# Patient Record
Sex: Male | Born: 1940 | Race: White | Hispanic: No | Marital: Married | Smoking: Never smoker
Health system: Southern US, Community
[De-identification: ages and names within clinical notes are randomized; demographics above are authoritative.]

## PROBLEM LIST (undated history)

## (undated) DIAGNOSIS — C9110 Chronic lymphocytic leukemia of B-cell type not having achieved remission: Secondary | ICD-10-CM

## (undated) DIAGNOSIS — M5430 Sciatica, unspecified side: Secondary | ICD-10-CM

## (undated) HISTORY — PX: INSERT / REPLACE / REMOVE PACEMAKER: SUR710

## (undated) MED FILL — Iron Sucrose Inj 20 MG/ML (Fe Equiv): INTRAVENOUS | Qty: 10 | Status: AC

---

## 2011-06-09 DIAGNOSIS — A070 Balantidiasis: Secondary | ICD-10-CM | POA: Diagnosis not present

## 2011-06-09 DIAGNOSIS — N400 Enlarged prostate without lower urinary tract symptoms: Secondary | ICD-10-CM | POA: Diagnosis not present

## 2011-07-22 DIAGNOSIS — C9110 Chronic lymphocytic leukemia of B-cell type not having achieved remission: Secondary | ICD-10-CM | POA: Diagnosis not present

## 2011-07-22 DIAGNOSIS — Z719 Counseling, unspecified: Secondary | ICD-10-CM | POA: Diagnosis not present

## 2011-09-16 DIAGNOSIS — I83893 Varicose veins of bilateral lower extremities with other complications: Secondary | ICD-10-CM | POA: Diagnosis not present

## 2011-09-16 DIAGNOSIS — G4700 Insomnia, unspecified: Secondary | ICD-10-CM | POA: Diagnosis not present

## 2011-11-25 DIAGNOSIS — M26629 Arthralgia of temporomandibular joint, unspecified side: Secondary | ICD-10-CM | POA: Diagnosis not present

## 2011-12-30 DIAGNOSIS — C9110 Chronic lymphocytic leukemia of B-cell type not having achieved remission: Secondary | ICD-10-CM | POA: Diagnosis not present

## 2011-12-30 DIAGNOSIS — Z719 Counseling, unspecified: Secondary | ICD-10-CM | POA: Diagnosis not present

## 2012-01-12 DIAGNOSIS — J040 Acute laryngitis: Secondary | ICD-10-CM | POA: Diagnosis not present

## 2012-02-03 DIAGNOSIS — B379 Candidiasis, unspecified: Secondary | ICD-10-CM | POA: Diagnosis not present

## 2012-02-03 DIAGNOSIS — J309 Allergic rhinitis, unspecified: Secondary | ICD-10-CM | POA: Diagnosis not present

## 2012-02-03 DIAGNOSIS — K219 Gastro-esophageal reflux disease without esophagitis: Secondary | ICD-10-CM | POA: Diagnosis not present

## 2012-02-12 DIAGNOSIS — S6390XA Sprain of unspecified part of unspecified wrist and hand, initial encounter: Secondary | ICD-10-CM | POA: Diagnosis not present

## 2012-02-12 DIAGNOSIS — M79609 Pain in unspecified limb: Secondary | ICD-10-CM | POA: Diagnosis not present

## 2012-02-25 DIAGNOSIS — Z23 Encounter for immunization: Secondary | ICD-10-CM | POA: Diagnosis not present

## 2012-05-12 DIAGNOSIS — H5200 Hypermetropia, unspecified eye: Secondary | ICD-10-CM | POA: Diagnosis not present

## 2012-05-12 DIAGNOSIS — H524 Presbyopia: Secondary | ICD-10-CM | POA: Diagnosis not present

## 2012-05-12 DIAGNOSIS — H2510 Age-related nuclear cataract, unspecified eye: Secondary | ICD-10-CM | POA: Diagnosis not present

## 2012-06-28 DIAGNOSIS — C9110 Chronic lymphocytic leukemia of B-cell type not having achieved remission: Secondary | ICD-10-CM | POA: Diagnosis not present

## 2012-06-28 DIAGNOSIS — I2510 Atherosclerotic heart disease of native coronary artery without angina pectoris: Secondary | ICD-10-CM | POA: Diagnosis not present

## 2012-06-28 DIAGNOSIS — R110 Nausea: Secondary | ICD-10-CM | POA: Diagnosis not present

## 2012-06-28 DIAGNOSIS — G609 Hereditary and idiopathic neuropathy, unspecified: Secondary | ICD-10-CM | POA: Diagnosis not present

## 2012-07-24 DIAGNOSIS — S82409A Unspecified fracture of shaft of unspecified fibula, initial encounter for closed fracture: Secondary | ICD-10-CM | POA: Diagnosis not present

## 2012-07-25 DIAGNOSIS — S8263XA Displaced fracture of lateral malleolus of unspecified fibula, initial encounter for closed fracture: Secondary | ICD-10-CM | POA: Diagnosis not present

## 2012-08-08 DIAGNOSIS — S8263XA Displaced fracture of lateral malleolus of unspecified fibula, initial encounter for closed fracture: Secondary | ICD-10-CM | POA: Diagnosis not present

## 2012-08-14 DIAGNOSIS — R351 Nocturia: Secondary | ICD-10-CM | POA: Diagnosis not present

## 2012-08-14 DIAGNOSIS — N401 Enlarged prostate with lower urinary tract symptoms: Secondary | ICD-10-CM | POA: Diagnosis not present

## 2012-08-14 DIAGNOSIS — N138 Other obstructive and reflux uropathy: Secondary | ICD-10-CM | POA: Diagnosis not present

## 2012-09-04 DIAGNOSIS — N401 Enlarged prostate with lower urinary tract symptoms: Secondary | ICD-10-CM | POA: Diagnosis not present

## 2012-09-04 DIAGNOSIS — N138 Other obstructive and reflux uropathy: Secondary | ICD-10-CM | POA: Diagnosis not present

## 2012-09-04 DIAGNOSIS — R351 Nocturia: Secondary | ICD-10-CM | POA: Diagnosis not present

## 2012-09-04 DIAGNOSIS — Z125 Encounter for screening for malignant neoplasm of prostate: Secondary | ICD-10-CM | POA: Diagnosis not present

## 2012-09-05 DIAGNOSIS — S8263XA Displaced fracture of lateral malleolus of unspecified fibula, initial encounter for closed fracture: Secondary | ICD-10-CM | POA: Diagnosis not present

## 2012-10-16 DIAGNOSIS — R351 Nocturia: Secondary | ICD-10-CM | POA: Diagnosis not present

## 2012-10-16 DIAGNOSIS — N138 Other obstructive and reflux uropathy: Secondary | ICD-10-CM | POA: Diagnosis not present

## 2012-10-16 DIAGNOSIS — N401 Enlarged prostate with lower urinary tract symptoms: Secondary | ICD-10-CM | POA: Diagnosis not present

## 2012-10-17 DIAGNOSIS — S8263XA Displaced fracture of lateral malleolus of unspecified fibula, initial encounter for closed fracture: Secondary | ICD-10-CM | POA: Diagnosis not present

## 2012-11-17 DIAGNOSIS — I442 Atrioventricular block, complete: Secondary | ICD-10-CM | POA: Diagnosis not present

## 2012-11-20 DIAGNOSIS — R351 Nocturia: Secondary | ICD-10-CM | POA: Diagnosis not present

## 2012-11-20 DIAGNOSIS — I442 Atrioventricular block, complete: Secondary | ICD-10-CM | POA: Diagnosis not present

## 2012-11-20 DIAGNOSIS — N401 Enlarged prostate with lower urinary tract symptoms: Secondary | ICD-10-CM | POA: Diagnosis not present

## 2012-11-20 DIAGNOSIS — N138 Other obstructive and reflux uropathy: Secondary | ICD-10-CM | POA: Diagnosis not present

## 2012-11-21 DIAGNOSIS — J029 Acute pharyngitis, unspecified: Secondary | ICD-10-CM | POA: Diagnosis not present

## 2012-11-21 DIAGNOSIS — J309 Allergic rhinitis, unspecified: Secondary | ICD-10-CM | POA: Diagnosis not present

## 2012-12-14 DIAGNOSIS — S93409A Sprain of unspecified ligament of unspecified ankle, initial encounter: Secondary | ICD-10-CM | POA: Diagnosis not present

## 2012-12-19 DIAGNOSIS — R351 Nocturia: Secondary | ICD-10-CM | POA: Diagnosis not present

## 2012-12-19 DIAGNOSIS — N529 Male erectile dysfunction, unspecified: Secondary | ICD-10-CM | POA: Diagnosis not present

## 2012-12-19 DIAGNOSIS — N401 Enlarged prostate with lower urinary tract symptoms: Secondary | ICD-10-CM | POA: Diagnosis not present

## 2012-12-19 DIAGNOSIS — N138 Other obstructive and reflux uropathy: Secondary | ICD-10-CM | POA: Diagnosis not present

## 2012-12-19 DIAGNOSIS — R21 Rash and other nonspecific skin eruption: Secondary | ICD-10-CM | POA: Diagnosis not present

## 2013-02-15 DIAGNOSIS — Z0000 Encounter for general adult medical examination without abnormal findings: Secondary | ICD-10-CM | POA: Diagnosis not present

## 2013-02-15 DIAGNOSIS — Z23 Encounter for immunization: Secondary | ICD-10-CM | POA: Diagnosis not present

## 2013-02-15 DIAGNOSIS — R946 Abnormal results of thyroid function studies: Secondary | ICD-10-CM | POA: Diagnosis not present

## 2013-02-15 DIAGNOSIS — C9110 Chronic lymphocytic leukemia of B-cell type not having achieved remission: Secondary | ICD-10-CM | POA: Diagnosis not present

## 2013-02-15 DIAGNOSIS — Z13220 Encounter for screening for lipoid disorders: Secondary | ICD-10-CM | POA: Diagnosis not present

## 2013-02-15 DIAGNOSIS — K219 Gastro-esophageal reflux disease without esophagitis: Secondary | ICD-10-CM | POA: Diagnosis not present

## 2013-03-02 DIAGNOSIS — Z23 Encounter for immunization: Secondary | ICD-10-CM | POA: Diagnosis not present

## 2013-03-21 DIAGNOSIS — R351 Nocturia: Secondary | ICD-10-CM | POA: Diagnosis not present

## 2013-03-21 DIAGNOSIS — N529 Male erectile dysfunction, unspecified: Secondary | ICD-10-CM | POA: Diagnosis not present

## 2013-03-21 DIAGNOSIS — N138 Other obstructive and reflux uropathy: Secondary | ICD-10-CM | POA: Diagnosis not present

## 2013-03-21 DIAGNOSIS — N401 Enlarged prostate with lower urinary tract symptoms: Secondary | ICD-10-CM | POA: Diagnosis not present

## 2013-03-26 DIAGNOSIS — J309 Allergic rhinitis, unspecified: Secondary | ICD-10-CM | POA: Diagnosis not present

## 2013-03-26 DIAGNOSIS — C9110 Chronic lymphocytic leukemia of B-cell type not having achieved remission: Secondary | ICD-10-CM | POA: Diagnosis not present

## 2013-04-05 DIAGNOSIS — I495 Sick sinus syndrome: Secondary | ICD-10-CM | POA: Diagnosis not present

## 2013-04-11 DIAGNOSIS — Z950 Presence of cardiac pacemaker: Secondary | ICD-10-CM | POA: Diagnosis not present

## 2013-04-11 DIAGNOSIS — I495 Sick sinus syndrome: Secondary | ICD-10-CM | POA: Diagnosis not present

## 2013-04-18 DIAGNOSIS — N529 Male erectile dysfunction, unspecified: Secondary | ICD-10-CM | POA: Diagnosis not present

## 2013-04-18 DIAGNOSIS — N401 Enlarged prostate with lower urinary tract symptoms: Secondary | ICD-10-CM | POA: Diagnosis not present

## 2013-04-18 DIAGNOSIS — R351 Nocturia: Secondary | ICD-10-CM | POA: Diagnosis not present

## 2013-04-18 DIAGNOSIS — N138 Other obstructive and reflux uropathy: Secondary | ICD-10-CM | POA: Diagnosis not present

## 2013-04-27 DIAGNOSIS — I459 Conduction disorder, unspecified: Secondary | ICD-10-CM | POA: Diagnosis not present

## 2013-05-01 DIAGNOSIS — I459 Conduction disorder, unspecified: Secondary | ICD-10-CM | POA: Diagnosis not present

## 2013-06-04 DIAGNOSIS — G4700 Insomnia, unspecified: Secondary | ICD-10-CM | POA: Diagnosis not present

## 2013-06-04 DIAGNOSIS — J111 Influenza due to unidentified influenza virus with other respiratory manifestations: Secondary | ICD-10-CM | POA: Diagnosis not present

## 2013-06-18 DIAGNOSIS — N138 Other obstructive and reflux uropathy: Secondary | ICD-10-CM | POA: Diagnosis not present

## 2013-06-18 DIAGNOSIS — R350 Frequency of micturition: Secondary | ICD-10-CM | POA: Diagnosis not present

## 2013-06-18 DIAGNOSIS — N401 Enlarged prostate with lower urinary tract symptoms: Secondary | ICD-10-CM | POA: Diagnosis not present

## 2013-06-18 DIAGNOSIS — R351 Nocturia: Secondary | ICD-10-CM | POA: Diagnosis not present

## 2013-08-15 DIAGNOSIS — I459 Conduction disorder, unspecified: Secondary | ICD-10-CM | POA: Diagnosis not present

## 2013-08-15 DIAGNOSIS — R079 Chest pain, unspecified: Secondary | ICD-10-CM | POA: Diagnosis not present

## 2013-08-23 DIAGNOSIS — E7800 Pure hypercholesterolemia, unspecified: Secondary | ICD-10-CM | POA: Diagnosis not present

## 2013-08-23 DIAGNOSIS — R079 Chest pain, unspecified: Secondary | ICD-10-CM | POA: Diagnosis not present

## 2013-08-23 DIAGNOSIS — I459 Conduction disorder, unspecified: Secondary | ICD-10-CM | POA: Diagnosis not present

## 2013-08-23 DIAGNOSIS — Z8249 Family history of ischemic heart disease and other diseases of the circulatory system: Secondary | ICD-10-CM | POA: Diagnosis not present

## 2013-08-23 DIAGNOSIS — Z950 Presence of cardiac pacemaker: Secondary | ICD-10-CM | POA: Diagnosis not present

## 2013-08-23 DIAGNOSIS — C9110 Chronic lymphocytic leukemia of B-cell type not having achieved remission: Secondary | ICD-10-CM | POA: Diagnosis not present

## 2013-08-23 DIAGNOSIS — I10 Essential (primary) hypertension: Secondary | ICD-10-CM | POA: Diagnosis not present

## 2013-09-06 DIAGNOSIS — R0789 Other chest pain: Secondary | ICD-10-CM | POA: Diagnosis not present

## 2013-09-06 DIAGNOSIS — I10 Essential (primary) hypertension: Secondary | ICD-10-CM | POA: Diagnosis not present

## 2013-10-15 DIAGNOSIS — R0789 Other chest pain: Secondary | ICD-10-CM | POA: Diagnosis not present

## 2013-10-19 DIAGNOSIS — I495 Sick sinus syndrome: Secondary | ICD-10-CM | POA: Diagnosis not present

## 2013-10-23 DIAGNOSIS — I495 Sick sinus syndrome: Secondary | ICD-10-CM | POA: Diagnosis not present

## 2013-11-21 DIAGNOSIS — N401 Enlarged prostate with lower urinary tract symptoms: Secondary | ICD-10-CM | POA: Diagnosis not present

## 2013-11-21 DIAGNOSIS — N138 Other obstructive and reflux uropathy: Secondary | ICD-10-CM | POA: Diagnosis not present

## 2013-11-21 DIAGNOSIS — Z125 Encounter for screening for malignant neoplasm of prostate: Secondary | ICD-10-CM | POA: Diagnosis not present

## 2013-12-06 DIAGNOSIS — H1030 Unspecified acute conjunctivitis, unspecified eye: Secondary | ICD-10-CM | POA: Diagnosis not present

## 2013-12-12 DIAGNOSIS — H11449 Conjunctival cysts, unspecified eye: Secondary | ICD-10-CM | POA: Diagnosis not present

## 2014-01-03 DIAGNOSIS — D2360 Other benign neoplasm of skin of unspecified upper limb, including shoulder: Secondary | ICD-10-CM | POA: Diagnosis not present

## 2014-01-03 DIAGNOSIS — IMO0002 Reserved for concepts with insufficient information to code with codable children: Secondary | ICD-10-CM | POA: Diagnosis not present

## 2014-01-03 DIAGNOSIS — L259 Unspecified contact dermatitis, unspecified cause: Secondary | ICD-10-CM | POA: Diagnosis not present

## 2014-01-03 DIAGNOSIS — L570 Actinic keratosis: Secondary | ICD-10-CM | POA: Diagnosis not present

## 2014-01-03 DIAGNOSIS — C44611 Basal cell carcinoma of skin of unspecified upper limb, including shoulder: Secondary | ICD-10-CM | POA: Diagnosis not present

## 2014-01-03 DIAGNOSIS — L578 Other skin changes due to chronic exposure to nonionizing radiation: Secondary | ICD-10-CM | POA: Diagnosis not present

## 2014-01-03 DIAGNOSIS — L821 Other seborrheic keratosis: Secondary | ICD-10-CM | POA: Diagnosis not present

## 2014-01-21 DIAGNOSIS — R946 Abnormal results of thyroid function studies: Secondary | ICD-10-CM | POA: Diagnosis not present

## 2014-01-21 DIAGNOSIS — C9110 Chronic lymphocytic leukemia of B-cell type not having achieved remission: Secondary | ICD-10-CM | POA: Diagnosis not present

## 2014-01-21 DIAGNOSIS — E7800 Pure hypercholesterolemia, unspecified: Secondary | ICD-10-CM | POA: Diagnosis not present

## 2014-01-21 DIAGNOSIS — Z13220 Encounter for screening for lipoid disorders: Secondary | ICD-10-CM | POA: Diagnosis not present

## 2014-01-21 DIAGNOSIS — K219 Gastro-esophageal reflux disease without esophagitis: Secondary | ICD-10-CM | POA: Diagnosis not present

## 2014-01-21 DIAGNOSIS — G4700 Insomnia, unspecified: Secondary | ICD-10-CM | POA: Diagnosis not present

## 2014-01-21 DIAGNOSIS — Z79899 Other long term (current) drug therapy: Secondary | ICD-10-CM | POA: Diagnosis not present

## 2014-03-08 DIAGNOSIS — Z23 Encounter for immunization: Secondary | ICD-10-CM | POA: Diagnosis not present

## 2014-05-08 DIAGNOSIS — C4442 Squamous cell carcinoma of skin of scalp and neck: Secondary | ICD-10-CM | POA: Diagnosis not present

## 2014-05-08 DIAGNOSIS — L570 Actinic keratosis: Secondary | ICD-10-CM | POA: Diagnosis not present

## 2014-05-08 DIAGNOSIS — C44311 Basal cell carcinoma of skin of nose: Secondary | ICD-10-CM | POA: Diagnosis not present

## 2014-05-16 DIAGNOSIS — C4441 Basal cell carcinoma of skin of scalp and neck: Secondary | ICD-10-CM | POA: Diagnosis not present

## 2014-05-23 DIAGNOSIS — N529 Male erectile dysfunction, unspecified: Secondary | ICD-10-CM | POA: Diagnosis not present

## 2014-05-23 DIAGNOSIS — N401 Enlarged prostate with lower urinary tract symptoms: Secondary | ICD-10-CM | POA: Diagnosis not present

## 2014-06-19 DIAGNOSIS — C4441 Basal cell carcinoma of skin of scalp and neck: Secondary | ICD-10-CM | POA: Diagnosis not present

## 2014-06-19 DIAGNOSIS — L570 Actinic keratosis: Secondary | ICD-10-CM | POA: Diagnosis not present

## 2014-07-24 DIAGNOSIS — N401 Enlarged prostate with lower urinary tract symptoms: Secondary | ICD-10-CM | POA: Diagnosis not present

## 2014-07-24 DIAGNOSIS — N529 Male erectile dysfunction, unspecified: Secondary | ICD-10-CM | POA: Diagnosis not present

## 2014-07-24 DIAGNOSIS — R351 Nocturia: Secondary | ICD-10-CM | POA: Diagnosis not present

## 2014-10-22 DIAGNOSIS — R21 Rash and other nonspecific skin eruption: Secondary | ICD-10-CM | POA: Diagnosis not present

## 2014-10-22 DIAGNOSIS — N529 Male erectile dysfunction, unspecified: Secondary | ICD-10-CM | POA: Diagnosis not present

## 2014-10-22 DIAGNOSIS — N401 Enlarged prostate with lower urinary tract symptoms: Secondary | ICD-10-CM | POA: Diagnosis not present

## 2014-10-22 DIAGNOSIS — R351 Nocturia: Secondary | ICD-10-CM | POA: Diagnosis not present

## 2014-10-25 DIAGNOSIS — G629 Polyneuropathy, unspecified: Secondary | ICD-10-CM | POA: Diagnosis not present

## 2014-10-25 DIAGNOSIS — R51 Headache: Secondary | ICD-10-CM | POA: Diagnosis not present

## 2014-10-25 DIAGNOSIS — M25532 Pain in left wrist: Secondary | ICD-10-CM | POA: Diagnosis not present

## 2014-10-25 DIAGNOSIS — I10 Essential (primary) hypertension: Secondary | ICD-10-CM | POA: Diagnosis not present

## 2014-10-25 DIAGNOSIS — M6289 Other specified disorders of muscle: Secondary | ICD-10-CM | POA: Diagnosis not present

## 2014-11-27 DIAGNOSIS — C91Z0 Other lymphoid leukemia not having achieved remission: Secondary | ICD-10-CM | POA: Diagnosis not present

## 2014-11-27 DIAGNOSIS — J309 Allergic rhinitis, unspecified: Secondary | ICD-10-CM | POA: Diagnosis not present

## 2015-01-17 DIAGNOSIS — H2513 Age-related nuclear cataract, bilateral: Secondary | ICD-10-CM | POA: Diagnosis not present

## 2015-01-17 DIAGNOSIS — H524 Presbyopia: Secondary | ICD-10-CM | POA: Diagnosis not present

## 2015-02-28 DIAGNOSIS — Z950 Presence of cardiac pacemaker: Secondary | ICD-10-CM | POA: Diagnosis not present

## 2015-02-28 DIAGNOSIS — R079 Chest pain, unspecified: Secondary | ICD-10-CM | POA: Diagnosis not present

## 2015-03-21 DIAGNOSIS — Z23 Encounter for immunization: Secondary | ICD-10-CM | POA: Diagnosis not present

## 2015-03-21 DIAGNOSIS — C91Z0 Other lymphoid leukemia not having achieved remission: Secondary | ICD-10-CM | POA: Diagnosis not present

## 2015-03-27 DIAGNOSIS — I83813 Varicose veins of bilateral lower extremities with pain: Secondary | ICD-10-CM | POA: Diagnosis not present

## 2015-05-09 DIAGNOSIS — I442 Atrioventricular block, complete: Secondary | ICD-10-CM | POA: Diagnosis not present

## 2015-05-19 DIAGNOSIS — I442 Atrioventricular block, complete: Secondary | ICD-10-CM | POA: Diagnosis not present

## 2015-06-23 DIAGNOSIS — L570 Actinic keratosis: Secondary | ICD-10-CM | POA: Diagnosis not present

## 2015-06-23 DIAGNOSIS — C4441 Basal cell carcinoma of skin of scalp and neck: Secondary | ICD-10-CM | POA: Diagnosis not present

## 2015-06-23 DIAGNOSIS — B353 Tinea pedis: Secondary | ICD-10-CM | POA: Diagnosis not present

## 2015-06-23 DIAGNOSIS — L578 Other skin changes due to chronic exposure to nonionizing radiation: Secondary | ICD-10-CM | POA: Diagnosis not present

## 2015-06-23 DIAGNOSIS — L821 Other seborrheic keratosis: Secondary | ICD-10-CM | POA: Diagnosis not present

## 2015-09-22 DIAGNOSIS — Z13220 Encounter for screening for lipoid disorders: Secondary | ICD-10-CM | POA: Diagnosis not present

## 2015-09-22 DIAGNOSIS — C91Z0 Other lymphoid leukemia not having achieved remission: Secondary | ICD-10-CM | POA: Diagnosis not present

## 2015-09-22 DIAGNOSIS — K219 Gastro-esophageal reflux disease without esophagitis: Secondary | ICD-10-CM | POA: Diagnosis not present

## 2015-09-22 DIAGNOSIS — Z23 Encounter for immunization: Secondary | ICD-10-CM | POA: Diagnosis not present

## 2015-09-22 DIAGNOSIS — J309 Allergic rhinitis, unspecified: Secondary | ICD-10-CM | POA: Diagnosis not present

## 2015-09-22 DIAGNOSIS — Z0000 Encounter for general adult medical examination without abnormal findings: Secondary | ICD-10-CM | POA: Diagnosis not present

## 2015-11-14 DIAGNOSIS — A070 Balantidiasis: Secondary | ICD-10-CM | POA: Diagnosis not present

## 2015-11-14 DIAGNOSIS — S0341XA Sprain of jaw, right side, initial encounter: Secondary | ICD-10-CM | POA: Diagnosis not present

## 2015-11-14 DIAGNOSIS — I442 Atrioventricular block, complete: Secondary | ICD-10-CM | POA: Diagnosis not present

## 2015-11-17 DIAGNOSIS — I459 Conduction disorder, unspecified: Secondary | ICD-10-CM | POA: Diagnosis not present

## 2015-11-17 DIAGNOSIS — I442 Atrioventricular block, complete: Secondary | ICD-10-CM | POA: Diagnosis not present

## 2015-11-17 DIAGNOSIS — Z950 Presence of cardiac pacemaker: Secondary | ICD-10-CM | POA: Diagnosis not present

## 2015-11-19 DIAGNOSIS — I442 Atrioventricular block, complete: Secondary | ICD-10-CM | POA: Diagnosis not present

## 2015-12-18 DIAGNOSIS — I4430 Unspecified atrioventricular block: Secondary | ICD-10-CM | POA: Diagnosis not present

## 2015-12-18 DIAGNOSIS — I2510 Atherosclerotic heart disease of native coronary artery without angina pectoris: Secondary | ICD-10-CM | POA: Diagnosis not present

## 2016-01-13 DIAGNOSIS — M5440 Lumbago with sciatica, unspecified side: Secondary | ICD-10-CM | POA: Diagnosis not present

## 2016-03-02 DIAGNOSIS — Z1389 Encounter for screening for other disorder: Secondary | ICD-10-CM | POA: Diagnosis not present

## 2016-03-02 DIAGNOSIS — C91Z0 Other lymphoid leukemia not having achieved remission: Secondary | ICD-10-CM | POA: Diagnosis not present

## 2016-03-02 DIAGNOSIS — M5440 Lumbago with sciatica, unspecified side: Secondary | ICD-10-CM | POA: Diagnosis not present

## 2016-03-02 DIAGNOSIS — Z9181 History of falling: Secondary | ICD-10-CM | POA: Diagnosis not present

## 2016-03-12 DIAGNOSIS — Z23 Encounter for immunization: Secondary | ICD-10-CM | POA: Diagnosis not present

## 2016-04-02 DIAGNOSIS — M47816 Spondylosis without myelopathy or radiculopathy, lumbar region: Secondary | ICD-10-CM | POA: Diagnosis not present

## 2016-04-02 DIAGNOSIS — M5440 Lumbago with sciatica, unspecified side: Secondary | ICD-10-CM | POA: Diagnosis not present

## 2016-04-02 DIAGNOSIS — M5186 Other intervertebral disc disorders, lumbar region: Secondary | ICD-10-CM | POA: Diagnosis not present

## 2016-04-19 DIAGNOSIS — M5432 Sciatica, left side: Secondary | ICD-10-CM | POA: Diagnosis not present

## 2016-04-19 DIAGNOSIS — M5412 Radiculopathy, cervical region: Secondary | ICD-10-CM | POA: Diagnosis not present

## 2016-05-20 DIAGNOSIS — M5432 Sciatica, left side: Secondary | ICD-10-CM | POA: Diagnosis not present

## 2016-05-20 DIAGNOSIS — M5416 Radiculopathy, lumbar region: Secondary | ICD-10-CM | POA: Diagnosis not present

## 2016-05-20 DIAGNOSIS — M4726 Other spondylosis with radiculopathy, lumbar region: Secondary | ICD-10-CM | POA: Diagnosis not present

## 2016-05-20 DIAGNOSIS — M5412 Radiculopathy, cervical region: Secondary | ICD-10-CM | POA: Diagnosis not present

## 2016-06-01 DIAGNOSIS — M48061 Spinal stenosis, lumbar region without neurogenic claudication: Secondary | ICD-10-CM | POA: Diagnosis not present

## 2016-06-01 DIAGNOSIS — M5416 Radiculopathy, lumbar region: Secondary | ICD-10-CM | POA: Diagnosis not present

## 2016-06-01 DIAGNOSIS — M5412 Radiculopathy, cervical region: Secondary | ICD-10-CM | POA: Diagnosis not present

## 2016-06-01 DIAGNOSIS — M47812 Spondylosis without myelopathy or radiculopathy, cervical region: Secondary | ICD-10-CM | POA: Diagnosis not present

## 2016-06-09 DIAGNOSIS — M5432 Sciatica, left side: Secondary | ICD-10-CM | POA: Diagnosis not present

## 2016-06-30 DIAGNOSIS — M5432 Sciatica, left side: Secondary | ICD-10-CM | POA: Diagnosis not present

## 2016-07-14 DIAGNOSIS — M5432 Sciatica, left side: Secondary | ICD-10-CM | POA: Diagnosis not present

## 2016-07-14 DIAGNOSIS — M5000 Cervical disc disorder with myelopathy, unspecified cervical region: Secondary | ICD-10-CM | POA: Diagnosis not present

## 2016-07-14 DIAGNOSIS — M4726 Other spondylosis with radiculopathy, lumbar region: Secondary | ICD-10-CM | POA: Diagnosis not present

## 2016-07-22 DIAGNOSIS — I2699 Other pulmonary embolism without acute cor pulmonale: Secondary | ICD-10-CM | POA: Diagnosis not present

## 2016-07-22 DIAGNOSIS — J9601 Acute respiratory failure with hypoxia: Secondary | ICD-10-CM | POA: Diagnosis not present

## 2016-07-22 DIAGNOSIS — Z950 Presence of cardiac pacemaker: Secondary | ICD-10-CM | POA: Diagnosis not present

## 2016-07-22 DIAGNOSIS — R0602 Shortness of breath: Secondary | ICD-10-CM | POA: Diagnosis not present

## 2016-07-22 DIAGNOSIS — C9110 Chronic lymphocytic leukemia of B-cell type not having achieved remission: Secondary | ICD-10-CM | POA: Diagnosis present

## 2016-07-22 DIAGNOSIS — Z7982 Long term (current) use of aspirin: Secondary | ICD-10-CM | POA: Diagnosis not present

## 2016-07-22 DIAGNOSIS — Z86711 Personal history of pulmonary embolism: Secondary | ICD-10-CM | POA: Diagnosis not present

## 2016-07-22 DIAGNOSIS — Z91041 Radiographic dye allergy status: Secondary | ICD-10-CM | POA: Diagnosis not present

## 2016-07-22 DIAGNOSIS — C9190 Lymphoid leukemia, unspecified not having achieved remission: Secondary | ICD-10-CM | POA: Diagnosis not present

## 2016-07-22 DIAGNOSIS — Z881 Allergy status to other antibiotic agents status: Secondary | ICD-10-CM | POA: Diagnosis not present

## 2016-07-22 DIAGNOSIS — Z79899 Other long term (current) drug therapy: Secondary | ICD-10-CM | POA: Diagnosis not present

## 2016-07-23 DIAGNOSIS — I2699 Other pulmonary embolism without acute cor pulmonale: Secondary | ICD-10-CM

## 2016-07-23 DIAGNOSIS — C9190 Lymphoid leukemia, unspecified not having achieved remission: Secondary | ICD-10-CM

## 2016-07-23 DIAGNOSIS — J9601 Acute respiratory failure with hypoxia: Secondary | ICD-10-CM

## 2016-07-27 DIAGNOSIS — Z1389 Encounter for screening for other disorder: Secondary | ICD-10-CM | POA: Diagnosis not present

## 2016-07-27 DIAGNOSIS — M5440 Lumbago with sciatica, unspecified side: Secondary | ICD-10-CM | POA: Diagnosis not present

## 2016-07-27 DIAGNOSIS — Z86711 Personal history of pulmonary embolism: Secondary | ICD-10-CM | POA: Diagnosis not present

## 2016-07-27 DIAGNOSIS — Z9181 History of falling: Secondary | ICD-10-CM | POA: Diagnosis not present

## 2016-08-07 DIAGNOSIS — M5441 Lumbago with sciatica, right side: Secondary | ICD-10-CM | POA: Diagnosis not present

## 2016-08-07 DIAGNOSIS — M7989 Other specified soft tissue disorders: Secondary | ICD-10-CM | POA: Diagnosis not present

## 2016-08-07 DIAGNOSIS — R0602 Shortness of breath: Secondary | ICD-10-CM | POA: Diagnosis not present

## 2016-08-07 DIAGNOSIS — Z950 Presence of cardiac pacemaker: Secondary | ICD-10-CM | POA: Diagnosis not present

## 2016-08-07 DIAGNOSIS — Z86711 Personal history of pulmonary embolism: Secondary | ICD-10-CM | POA: Diagnosis not present

## 2016-08-07 DIAGNOSIS — I209 Angina pectoris, unspecified: Secondary | ICD-10-CM | POA: Diagnosis not present

## 2016-08-07 DIAGNOSIS — Z79899 Other long term (current) drug therapy: Secondary | ICD-10-CM | POA: Diagnosis not present

## 2016-08-07 DIAGNOSIS — K219 Gastro-esophageal reflux disease without esophagitis: Secondary | ICD-10-CM | POA: Diagnosis not present

## 2016-08-07 DIAGNOSIS — R591 Generalized enlarged lymph nodes: Secondary | ICD-10-CM | POA: Diagnosis not present

## 2016-08-07 DIAGNOSIS — R0609 Other forms of dyspnea: Secondary | ICD-10-CM | POA: Diagnosis not present

## 2016-08-07 DIAGNOSIS — C9190 Lymphoid leukemia, unspecified not having achieved remission: Secondary | ICD-10-CM | POA: Diagnosis not present

## 2016-08-07 DIAGNOSIS — J9601 Acute respiratory failure with hypoxia: Secondary | ICD-10-CM | POA: Diagnosis not present

## 2016-08-07 DIAGNOSIS — E669 Obesity, unspecified: Secondary | ICD-10-CM | POA: Diagnosis not present

## 2016-08-07 DIAGNOSIS — Z7982 Long term (current) use of aspirin: Secondary | ICD-10-CM | POA: Diagnosis not present

## 2016-08-07 DIAGNOSIS — D696 Thrombocytopenia, unspecified: Secondary | ICD-10-CM | POA: Diagnosis not present

## 2016-08-07 DIAGNOSIS — M549 Dorsalgia, unspecified: Secondary | ICD-10-CM | POA: Diagnosis not present

## 2016-08-07 DIAGNOSIS — G8929 Other chronic pain: Secondary | ICD-10-CM | POA: Diagnosis not present

## 2016-08-07 DIAGNOSIS — Z6832 Body mass index (BMI) 32.0-32.9, adult: Secondary | ICD-10-CM | POA: Diagnosis not present

## 2016-08-08 DIAGNOSIS — M549 Dorsalgia, unspecified: Secondary | ICD-10-CM | POA: Diagnosis not present

## 2016-08-08 DIAGNOSIS — D696 Thrombocytopenia, unspecified: Secondary | ICD-10-CM | POA: Diagnosis not present

## 2016-08-08 DIAGNOSIS — J9601 Acute respiratory failure with hypoxia: Secondary | ICD-10-CM | POA: Diagnosis not present

## 2016-08-08 DIAGNOSIS — C9110 Chronic lymphocytic leukemia of B-cell type not having achieved remission: Secondary | ICD-10-CM | POA: Diagnosis not present

## 2016-08-08 DIAGNOSIS — Z950 Presence of cardiac pacemaker: Secondary | ICD-10-CM | POA: Diagnosis not present

## 2016-08-08 DIAGNOSIS — R0609 Other forms of dyspnea: Secondary | ICD-10-CM | POA: Diagnosis not present

## 2016-08-08 DIAGNOSIS — E669 Obesity, unspecified: Secondary | ICD-10-CM | POA: Diagnosis not present

## 2016-08-08 DIAGNOSIS — R591 Generalized enlarged lymph nodes: Secondary | ICD-10-CM | POA: Diagnosis not present

## 2016-08-08 DIAGNOSIS — K219 Gastro-esophageal reflux disease without esophagitis: Secondary | ICD-10-CM | POA: Diagnosis not present

## 2016-08-08 DIAGNOSIS — M7989 Other specified soft tissue disorders: Secondary | ICD-10-CM | POA: Diagnosis not present

## 2016-08-08 DIAGNOSIS — C9190 Lymphoid leukemia, unspecified not having achieved remission: Secondary | ICD-10-CM | POA: Diagnosis not present

## 2016-08-09 DIAGNOSIS — D696 Thrombocytopenia, unspecified: Secondary | ICD-10-CM | POA: Diagnosis not present

## 2016-08-09 DIAGNOSIS — E669 Obesity, unspecified: Secondary | ICD-10-CM | POA: Diagnosis not present

## 2016-08-09 DIAGNOSIS — Z950 Presence of cardiac pacemaker: Secondary | ICD-10-CM | POA: Diagnosis not present

## 2016-08-09 DIAGNOSIS — K219 Gastro-esophageal reflux disease without esophagitis: Secondary | ICD-10-CM | POA: Diagnosis not present

## 2016-08-09 DIAGNOSIS — R0602 Shortness of breath: Secondary | ICD-10-CM | POA: Diagnosis not present

## 2016-08-09 DIAGNOSIS — R591 Generalized enlarged lymph nodes: Secondary | ICD-10-CM | POA: Diagnosis not present

## 2016-08-09 DIAGNOSIS — M549 Dorsalgia, unspecified: Secondary | ICD-10-CM | POA: Diagnosis not present

## 2016-08-09 DIAGNOSIS — R0609 Other forms of dyspnea: Secondary | ICD-10-CM | POA: Diagnosis not present

## 2016-08-09 DIAGNOSIS — M7989 Other specified soft tissue disorders: Secondary | ICD-10-CM | POA: Diagnosis not present

## 2016-08-09 DIAGNOSIS — R600 Localized edema: Secondary | ICD-10-CM | POA: Diagnosis not present

## 2016-08-09 DIAGNOSIS — J9601 Acute respiratory failure with hypoxia: Secondary | ICD-10-CM | POA: Diagnosis not present

## 2016-08-09 DIAGNOSIS — C9190 Lymphoid leukemia, unspecified not having achieved remission: Secondary | ICD-10-CM | POA: Diagnosis not present

## 2016-08-12 DIAGNOSIS — I442 Atrioventricular block, complete: Secondary | ICD-10-CM | POA: Insufficient documentation

## 2016-08-12 DIAGNOSIS — Z45018 Encounter for adjustment and management of other part of cardiac pacemaker: Secondary | ICD-10-CM | POA: Insufficient documentation

## 2016-08-12 HISTORY — DX: Atrioventricular block, complete: I44.2

## 2016-08-19 DIAGNOSIS — M5440 Lumbago with sciatica, unspecified side: Secondary | ICD-10-CM | POA: Diagnosis not present

## 2016-08-19 DIAGNOSIS — Z1389 Encounter for screening for other disorder: Secondary | ICD-10-CM | POA: Diagnosis not present

## 2016-08-19 DIAGNOSIS — C91Z0 Other lymphoid leukemia not having achieved remission: Secondary | ICD-10-CM | POA: Diagnosis not present

## 2016-08-19 DIAGNOSIS — Z9181 History of falling: Secondary | ICD-10-CM | POA: Diagnosis not present

## 2016-08-31 DIAGNOSIS — C91Z0 Other lymphoid leukemia not having achieved remission: Secondary | ICD-10-CM | POA: Diagnosis not present

## 2016-08-31 DIAGNOSIS — K1230 Oral mucositis (ulcerative), unspecified: Secondary | ICD-10-CM | POA: Diagnosis not present

## 2016-08-31 DIAGNOSIS — M5432 Sciatica, left side: Secondary | ICD-10-CM | POA: Diagnosis not present

## 2016-08-31 DIAGNOSIS — K121 Other forms of stomatitis: Secondary | ICD-10-CM | POA: Diagnosis not present

## 2016-09-02 DIAGNOSIS — R0602 Shortness of breath: Secondary | ICD-10-CM

## 2016-09-02 DIAGNOSIS — Z45018 Encounter for adjustment and management of other part of cardiac pacemaker: Secondary | ICD-10-CM | POA: Diagnosis not present

## 2016-09-02 DIAGNOSIS — C9110 Chronic lymphocytic leukemia of B-cell type not having achieved remission: Secondary | ICD-10-CM | POA: Insufficient documentation

## 2016-09-02 DIAGNOSIS — I442 Atrioventricular block, complete: Secondary | ICD-10-CM | POA: Diagnosis not present

## 2016-09-02 HISTORY — DX: Shortness of breath: R06.02

## 2016-09-06 DIAGNOSIS — C9110 Chronic lymphocytic leukemia of B-cell type not having achieved remission: Secondary | ICD-10-CM | POA: Diagnosis not present

## 2016-09-06 DIAGNOSIS — I442 Atrioventricular block, complete: Secondary | ICD-10-CM | POA: Diagnosis not present

## 2016-09-06 DIAGNOSIS — R0602 Shortness of breath: Secondary | ICD-10-CM | POA: Diagnosis not present

## 2016-09-06 DIAGNOSIS — Z45018 Encounter for adjustment and management of other part of cardiac pacemaker: Secondary | ICD-10-CM | POA: Diagnosis not present

## 2016-09-14 DIAGNOSIS — C9110 Chronic lymphocytic leukemia of B-cell type not having achieved remission: Secondary | ICD-10-CM | POA: Diagnosis not present

## 2016-09-24 DIAGNOSIS — M4696 Unspecified inflammatory spondylopathy, lumbar region: Secondary | ICD-10-CM | POA: Diagnosis not present

## 2016-09-24 DIAGNOSIS — C9510 Chronic leukemia of unspecified cell type not having achieved remission: Secondary | ICD-10-CM | POA: Diagnosis not present

## 2016-09-24 DIAGNOSIS — G894 Chronic pain syndrome: Secondary | ICD-10-CM | POA: Diagnosis not present

## 2016-09-24 DIAGNOSIS — M5136 Other intervertebral disc degeneration, lumbar region: Secondary | ICD-10-CM | POA: Diagnosis not present

## 2016-09-24 DIAGNOSIS — Q761 Klippel-Feil syndrome: Secondary | ICD-10-CM | POA: Diagnosis not present

## 2016-10-06 DIAGNOSIS — C9190 Lymphoid leukemia, unspecified not having achieved remission: Secondary | ICD-10-CM | POA: Diagnosis not present

## 2016-10-06 DIAGNOSIS — R0602 Shortness of breath: Secondary | ICD-10-CM | POA: Diagnosis not present

## 2016-10-06 DIAGNOSIS — Z45018 Encounter for adjustment and management of other part of cardiac pacemaker: Secondary | ICD-10-CM | POA: Diagnosis not present

## 2016-10-06 DIAGNOSIS — I442 Atrioventricular block, complete: Secondary | ICD-10-CM | POA: Diagnosis not present

## 2016-10-12 DIAGNOSIS — I442 Atrioventricular block, complete: Secondary | ICD-10-CM | POA: Diagnosis not present

## 2016-10-12 DIAGNOSIS — R0602 Shortness of breath: Secondary | ICD-10-CM | POA: Diagnosis not present

## 2016-10-12 DIAGNOSIS — Z950 Presence of cardiac pacemaker: Secondary | ICD-10-CM | POA: Diagnosis not present

## 2016-10-13 DIAGNOSIS — Q761 Klippel-Feil syndrome: Secondary | ICD-10-CM | POA: Diagnosis not present

## 2016-10-13 DIAGNOSIS — M4696 Unspecified inflammatory spondylopathy, lumbar region: Secondary | ICD-10-CM | POA: Diagnosis not present

## 2016-10-13 DIAGNOSIS — C9510 Chronic leukemia of unspecified cell type not having achieved remission: Secondary | ICD-10-CM | POA: Diagnosis not present

## 2016-10-13 DIAGNOSIS — G894 Chronic pain syndrome: Secondary | ICD-10-CM | POA: Diagnosis not present

## 2016-10-13 DIAGNOSIS — M5136 Other intervertebral disc degeneration, lumbar region: Secondary | ICD-10-CM | POA: Diagnosis not present

## 2016-10-21 DIAGNOSIS — C9510 Chronic leukemia of unspecified cell type not having achieved remission: Secondary | ICD-10-CM | POA: Diagnosis not present

## 2016-10-21 DIAGNOSIS — M4696 Unspecified inflammatory spondylopathy, lumbar region: Secondary | ICD-10-CM | POA: Diagnosis not present

## 2016-10-21 DIAGNOSIS — G894 Chronic pain syndrome: Secondary | ICD-10-CM | POA: Diagnosis not present

## 2016-10-21 DIAGNOSIS — M5136 Other intervertebral disc degeneration, lumbar region: Secondary | ICD-10-CM | POA: Diagnosis not present

## 2016-10-21 DIAGNOSIS — Q761 Klippel-Feil syndrome: Secondary | ICD-10-CM | POA: Diagnosis not present

## 2016-11-02 DIAGNOSIS — Z6829 Body mass index (BMI) 29.0-29.9, adult: Secondary | ICD-10-CM | POA: Diagnosis not present

## 2016-11-02 DIAGNOSIS — G8929 Other chronic pain: Secondary | ICD-10-CM | POA: Diagnosis not present

## 2016-11-02 DIAGNOSIS — A070 Balantidiasis: Secondary | ICD-10-CM | POA: Diagnosis not present

## 2016-11-15 DIAGNOSIS — L0390 Cellulitis, unspecified: Secondary | ICD-10-CM | POA: Diagnosis not present

## 2016-11-15 DIAGNOSIS — A770 Spotted fever due to Rickettsia rickettsii: Secondary | ICD-10-CM | POA: Diagnosis not present

## 2016-11-15 DIAGNOSIS — A6929 Other conditions associated with Lyme disease: Secondary | ICD-10-CM | POA: Diagnosis not present

## 2016-11-15 DIAGNOSIS — L570 Actinic keratosis: Secondary | ICD-10-CM | POA: Diagnosis not present

## 2016-11-17 DIAGNOSIS — D485 Neoplasm of uncertain behavior of skin: Secondary | ICD-10-CM | POA: Diagnosis not present

## 2016-11-19 DIAGNOSIS — M5136 Other intervertebral disc degeneration, lumbar region: Secondary | ICD-10-CM | POA: Diagnosis not present

## 2016-11-19 DIAGNOSIS — G894 Chronic pain syndrome: Secondary | ICD-10-CM | POA: Diagnosis not present

## 2016-11-19 DIAGNOSIS — Q761 Klippel-Feil syndrome: Secondary | ICD-10-CM | POA: Diagnosis not present

## 2016-11-19 DIAGNOSIS — M4696 Unspecified inflammatory spondylopathy, lumbar region: Secondary | ICD-10-CM | POA: Diagnosis not present

## 2016-11-19 DIAGNOSIS — C9510 Chronic leukemia of unspecified cell type not having achieved remission: Secondary | ICD-10-CM | POA: Diagnosis not present

## 2016-12-02 DIAGNOSIS — I442 Atrioventricular block, complete: Secondary | ICD-10-CM | POA: Diagnosis not present

## 2016-12-02 DIAGNOSIS — Z45018 Encounter for adjustment and management of other part of cardiac pacemaker: Secondary | ICD-10-CM | POA: Diagnosis not present

## 2016-12-16 DIAGNOSIS — D235 Other benign neoplasm of skin of trunk: Secondary | ICD-10-CM | POA: Diagnosis not present

## 2016-12-16 DIAGNOSIS — L820 Inflamed seborrheic keratosis: Secondary | ICD-10-CM | POA: Diagnosis not present

## 2016-12-16 DIAGNOSIS — L570 Actinic keratosis: Secondary | ICD-10-CM | POA: Diagnosis not present

## 2016-12-22 DIAGNOSIS — M4696 Unspecified inflammatory spondylopathy, lumbar region: Secondary | ICD-10-CM | POA: Diagnosis not present

## 2016-12-22 DIAGNOSIS — G894 Chronic pain syndrome: Secondary | ICD-10-CM | POA: Diagnosis not present

## 2016-12-22 DIAGNOSIS — C9510 Chronic leukemia of unspecified cell type not having achieved remission: Secondary | ICD-10-CM | POA: Diagnosis not present

## 2016-12-22 DIAGNOSIS — M5136 Other intervertebral disc degeneration, lumbar region: Secondary | ICD-10-CM | POA: Diagnosis not present

## 2016-12-22 DIAGNOSIS — Q761 Klippel-Feil syndrome: Secondary | ICD-10-CM | POA: Diagnosis not present

## 2017-01-05 DIAGNOSIS — M5136 Other intervertebral disc degeneration, lumbar region: Secondary | ICD-10-CM | POA: Diagnosis not present

## 2017-01-05 DIAGNOSIS — C9510 Chronic leukemia of unspecified cell type not having achieved remission: Secondary | ICD-10-CM | POA: Diagnosis not present

## 2017-01-05 DIAGNOSIS — M4696 Unspecified inflammatory spondylopathy, lumbar region: Secondary | ICD-10-CM | POA: Diagnosis not present

## 2017-01-05 DIAGNOSIS — G894 Chronic pain syndrome: Secondary | ICD-10-CM | POA: Diagnosis not present

## 2017-01-05 DIAGNOSIS — Q761 Klippel-Feil syndrome: Secondary | ICD-10-CM | POA: Diagnosis not present

## 2017-02-04 DIAGNOSIS — G8929 Other chronic pain: Secondary | ICD-10-CM | POA: Diagnosis not present

## 2017-02-04 DIAGNOSIS — Z6828 Body mass index (BMI) 28.0-28.9, adult: Secondary | ICD-10-CM | POA: Diagnosis not present

## 2017-02-04 DIAGNOSIS — M5440 Lumbago with sciatica, unspecified side: Secondary | ICD-10-CM | POA: Diagnosis not present

## 2017-02-04 DIAGNOSIS — K648 Other hemorrhoids: Secondary | ICD-10-CM | POA: Diagnosis not present

## 2017-02-21 DIAGNOSIS — R350 Frequency of micturition: Secondary | ICD-10-CM | POA: Diagnosis not present

## 2017-02-21 DIAGNOSIS — Z79899 Other long term (current) drug therapy: Secondary | ICD-10-CM | POA: Diagnosis not present

## 2017-02-21 DIAGNOSIS — N401 Enlarged prostate with lower urinary tract symptoms: Secondary | ICD-10-CM | POA: Diagnosis not present

## 2017-02-21 DIAGNOSIS — N402 Nodular prostate without lower urinary tract symptoms: Secondary | ICD-10-CM | POA: Diagnosis not present

## 2017-02-21 DIAGNOSIS — Z125 Encounter for screening for malignant neoplasm of prostate: Secondary | ICD-10-CM | POA: Diagnosis not present

## 2017-02-21 DIAGNOSIS — R351 Nocturia: Secondary | ICD-10-CM | POA: Diagnosis not present

## 2017-02-28 DIAGNOSIS — M4696 Unspecified inflammatory spondylopathy, lumbar region: Secondary | ICD-10-CM | POA: Diagnosis not present

## 2017-02-28 DIAGNOSIS — C9510 Chronic leukemia of unspecified cell type not having achieved remission: Secondary | ICD-10-CM | POA: Diagnosis not present

## 2017-02-28 DIAGNOSIS — M5136 Other intervertebral disc degeneration, lumbar region: Secondary | ICD-10-CM | POA: Diagnosis not present

## 2017-02-28 DIAGNOSIS — G894 Chronic pain syndrome: Secondary | ICD-10-CM | POA: Diagnosis not present

## 2017-02-28 DIAGNOSIS — Q761 Klippel-Feil syndrome: Secondary | ICD-10-CM | POA: Diagnosis not present

## 2017-03-25 DIAGNOSIS — Z23 Encounter for immunization: Secondary | ICD-10-CM | POA: Diagnosis not present

## 2017-03-25 DIAGNOSIS — C9110 Chronic lymphocytic leukemia of B-cell type not having achieved remission: Secondary | ICD-10-CM | POA: Diagnosis not present

## 2017-04-04 DIAGNOSIS — M5136 Other intervertebral disc degeneration, lumbar region: Secondary | ICD-10-CM | POA: Diagnosis not present

## 2017-04-04 DIAGNOSIS — Q761 Klippel-Feil syndrome: Secondary | ICD-10-CM | POA: Diagnosis not present

## 2017-04-04 DIAGNOSIS — M4696 Unspecified inflammatory spondylopathy, lumbar region: Secondary | ICD-10-CM | POA: Diagnosis not present

## 2017-04-04 DIAGNOSIS — G894 Chronic pain syndrome: Secondary | ICD-10-CM | POA: Diagnosis not present

## 2017-04-04 DIAGNOSIS — C9510 Chronic leukemia of unspecified cell type not having achieved remission: Secondary | ICD-10-CM | POA: Diagnosis not present

## 2017-04-14 DIAGNOSIS — Z1339 Encounter for screening examination for other mental health and behavioral disorders: Secondary | ICD-10-CM | POA: Diagnosis not present

## 2017-04-14 DIAGNOSIS — J029 Acute pharyngitis, unspecified: Secondary | ICD-10-CM | POA: Diagnosis not present

## 2017-04-14 DIAGNOSIS — Z6828 Body mass index (BMI) 28.0-28.9, adult: Secondary | ICD-10-CM | POA: Diagnosis not present

## 2017-04-14 DIAGNOSIS — G4700 Insomnia, unspecified: Secondary | ICD-10-CM | POA: Diagnosis not present

## 2017-05-23 DIAGNOSIS — L821 Other seborrheic keratosis: Secondary | ICD-10-CM | POA: Diagnosis not present

## 2017-05-23 DIAGNOSIS — L578 Other skin changes due to chronic exposure to nonionizing radiation: Secondary | ICD-10-CM | POA: Diagnosis not present

## 2017-05-23 DIAGNOSIS — L570 Actinic keratosis: Secondary | ICD-10-CM | POA: Diagnosis not present

## 2017-05-24 DIAGNOSIS — N401 Enlarged prostate with lower urinary tract symptoms: Secondary | ICD-10-CM | POA: Diagnosis not present

## 2017-05-24 DIAGNOSIS — R351 Nocturia: Secondary | ICD-10-CM | POA: Diagnosis not present

## 2017-06-16 DIAGNOSIS — Z45018 Encounter for adjustment and management of other part of cardiac pacemaker: Secondary | ICD-10-CM | POA: Diagnosis not present

## 2017-06-16 DIAGNOSIS — I442 Atrioventricular block, complete: Secondary | ICD-10-CM | POA: Diagnosis not present

## 2017-07-05 DIAGNOSIS — R21 Rash and other nonspecific skin eruption: Secondary | ICD-10-CM | POA: Diagnosis not present

## 2017-07-05 DIAGNOSIS — N401 Enlarged prostate with lower urinary tract symptoms: Secondary | ICD-10-CM | POA: Diagnosis not present

## 2017-07-05 DIAGNOSIS — R350 Frequency of micturition: Secondary | ICD-10-CM | POA: Diagnosis not present

## 2017-07-20 DIAGNOSIS — M961 Postlaminectomy syndrome, not elsewhere classified: Secondary | ICD-10-CM | POA: Diagnosis not present

## 2017-07-20 DIAGNOSIS — G894 Chronic pain syndrome: Secondary | ICD-10-CM | POA: Diagnosis not present

## 2017-07-20 DIAGNOSIS — C9510 Chronic leukemia of unspecified cell type not having achieved remission: Secondary | ICD-10-CM | POA: Diagnosis not present

## 2017-07-20 DIAGNOSIS — Q761 Klippel-Feil syndrome: Secondary | ICD-10-CM | POA: Diagnosis not present

## 2017-07-20 DIAGNOSIS — M5136 Other intervertebral disc degeneration, lumbar region: Secondary | ICD-10-CM | POA: Diagnosis not present

## 2017-08-19 DIAGNOSIS — N401 Enlarged prostate with lower urinary tract symptoms: Secondary | ICD-10-CM | POA: Diagnosis not present

## 2017-08-19 DIAGNOSIS — R350 Frequency of micturition: Secondary | ICD-10-CM | POA: Diagnosis not present

## 2017-08-19 DIAGNOSIS — R21 Rash and other nonspecific skin eruption: Secondary | ICD-10-CM | POA: Diagnosis not present

## 2017-09-22 DIAGNOSIS — C9110 Chronic lymphocytic leukemia of B-cell type not having achieved remission: Secondary | ICD-10-CM | POA: Diagnosis not present

## 2017-09-27 DIAGNOSIS — Z6827 Body mass index (BMI) 27.0-27.9, adult: Secondary | ICD-10-CM | POA: Diagnosis not present

## 2017-09-27 DIAGNOSIS — R05 Cough: Secondary | ICD-10-CM | POA: Diagnosis not present

## 2017-09-30 DIAGNOSIS — R3129 Other microscopic hematuria: Secondary | ICD-10-CM | POA: Diagnosis not present

## 2017-09-30 DIAGNOSIS — R319 Hematuria, unspecified: Secondary | ICD-10-CM | POA: Diagnosis not present

## 2017-09-30 DIAGNOSIS — R161 Splenomegaly, not elsewhere classified: Secondary | ICD-10-CM | POA: Diagnosis not present

## 2017-09-30 DIAGNOSIS — N139 Obstructive and reflux uropathy, unspecified: Secondary | ICD-10-CM | POA: Diagnosis not present

## 2017-09-30 DIAGNOSIS — E860 Dehydration: Secondary | ICD-10-CM | POA: Diagnosis not present

## 2017-10-05 DIAGNOSIS — R339 Retention of urine, unspecified: Secondary | ICD-10-CM | POA: Diagnosis not present

## 2017-10-05 DIAGNOSIS — N401 Enlarged prostate with lower urinary tract symptoms: Secondary | ICD-10-CM | POA: Diagnosis not present

## 2017-10-12 DIAGNOSIS — J301 Allergic rhinitis due to pollen: Secondary | ICD-10-CM | POA: Diagnosis not present

## 2017-10-12 DIAGNOSIS — N401 Enlarged prostate with lower urinary tract symptoms: Secondary | ICD-10-CM | POA: Diagnosis not present

## 2017-10-12 DIAGNOSIS — R339 Retention of urine, unspecified: Secondary | ICD-10-CM | POA: Diagnosis not present

## 2017-10-19 DIAGNOSIS — N401 Enlarged prostate with lower urinary tract symptoms: Secondary | ICD-10-CM | POA: Diagnosis not present

## 2017-10-19 DIAGNOSIS — N3289 Other specified disorders of bladder: Secondary | ICD-10-CM | POA: Diagnosis not present

## 2017-11-23 DIAGNOSIS — G43909 Migraine, unspecified, not intractable, without status migrainosus: Secondary | ICD-10-CM | POA: Diagnosis not present

## 2017-11-28 DIAGNOSIS — N401 Enlarged prostate with lower urinary tract symptoms: Secondary | ICD-10-CM | POA: Diagnosis not present

## 2017-11-28 DIAGNOSIS — N3289 Other specified disorders of bladder: Secondary | ICD-10-CM | POA: Diagnosis not present

## 2017-11-28 DIAGNOSIS — K5900 Constipation, unspecified: Secondary | ICD-10-CM | POA: Diagnosis not present

## 2017-12-07 ENCOUNTER — Ambulatory Visit: Payer: Medicare Other | Admitting: Cardiology

## 2017-12-12 DIAGNOSIS — Z950 Presence of cardiac pacemaker: Secondary | ICD-10-CM

## 2017-12-12 HISTORY — DX: Presence of cardiac pacemaker: Z95.0

## 2017-12-13 ENCOUNTER — Encounter: Payer: Self-pay | Admitting: Cardiology

## 2017-12-13 ENCOUNTER — Ambulatory Visit (INDEPENDENT_AMBULATORY_CARE_PROVIDER_SITE_OTHER): Payer: Medicare Other | Admitting: Cardiology

## 2017-12-13 VITALS — BP 130/72 | HR 60 | Ht 68.0 in | Wt 180.0 lb

## 2017-12-13 DIAGNOSIS — C9190 Lymphoid leukemia, unspecified not having achieved remission: Secondary | ICD-10-CM | POA: Diagnosis not present

## 2017-12-13 DIAGNOSIS — Z950 Presence of cardiac pacemaker: Secondary | ICD-10-CM

## 2017-12-13 DIAGNOSIS — R0602 Shortness of breath: Secondary | ICD-10-CM

## 2017-12-13 DIAGNOSIS — C9110 Chronic lymphocytic leukemia of B-cell type not having achieved remission: Secondary | ICD-10-CM

## 2017-12-13 DIAGNOSIS — I442 Atrioventricular block, complete: Secondary | ICD-10-CM | POA: Diagnosis not present

## 2017-12-13 MED ORDER — NITROGLYCERIN 0.4 MG SL SUBL: 0.4000 mg | SUBLINGUAL_TABLET | SUBLINGUAL | 11 refills | Status: DC | PRN

## 2017-12-13 NOTE — Progress Notes (Signed)
Cardiology Office Note:    Date:  12/13/2017   ID:  Michael Aguirre, DOB 1941-03-22, MRN [id number]  PCP:  Michael Flow, MD  Cardiologist:  Michael Campus, MD    Referring MD: Michael Flow, MD   No chief complaint on file. Complaining of weakness and fatigue  History of Present Illness:    Michael Aguirre is a 77 y.o. male with multiple medical issues he does have pacemaker.  Also chronic lymphocytic leukemia.  He comes today to office for follow-up leading complaint is fatigue tiredness however he had very chaotic day he can sleep until mid day and sometimes wakes up at 7:00 in the morning on top of that he does have a problem with the bladder and actually when asking question what is the most important issue that he have to back him the most he told me straight that this is a bladder problem.  Denies have any chest pain tightness squeezing pressure burning chest shortness of breath is still there.  But relatively stable.  I did review our test that we done about a year ago with fine.  History reviewed. No pertinent past medical history.  Past Surgical History:  Procedure Laterality Date  . INSERT / REPLACE / REMOVE PACEMAKER     Medtronic    Current Medications: Current Meds  Medication Sig  . aspirin EC 81 MG tablet Take 81 mg by mouth as needed.  . finasteride (PROSCAR) 5 MG tablet Take 5 mg by mouth daily.  . montelukast (SINGULAIR) 10 MG tablet Take 10 mg by mouth at bedtime.  . nitroGLYCERIN (NITROSTAT) 0.4 MG SL tablet Place 1 tablet (0.4 mg total) under the tongue every 5 (five) minutes as needed.  . pantoprazole (PROTONIX) 40 MG tablet Take 1 tablet by mouth daily.  . tamsulosin (FLOMAX) 0.4 MG CAPS capsule Take 0.4 mg by mouth 2 (two) times daily.  . [DISCONTINUED] nitroGLYCERIN (NITROSTAT) 0.4 MG SL tablet Place 0.4 mg under the tongue every 5 (five) minutes as needed.     Allergies:   Tetracycline   Social History   Socioeconomic History  . Marital status:  Single    Spouse name: Not on file  . Number of children: Not on file  . Years of education: Not on file  . Highest education level: Not on file  Occupational History  . Not on file  Social Needs  . Financial resource strain: Not on file  . Food insecurity:    Worry: Not on file    Inability: Not on file  . Transportation needs:    Medical: Not on file    Non-medical: Not on file  Tobacco Use  . Smoking status: Never Smoker  . Smokeless tobacco: Never Used  Substance and Sexual Activity  . Alcohol use: Not Currently  . Drug use: Not on file  . Sexual activity: Not on file  Lifestyle  . Physical activity:    Days per week: Not on file    Minutes per session: Not on file  . Stress: Not on file  Relationships  . Social connections:    Talks on phone: Not on file    Gets together: Not on file    Attends religious service: Not on file    Active member of club or organization: Not on file    Attends meetings of clubs or organizations: Not on file    Relationship status: Not on file  Other Topics Concern  . Not on file  Social History Narrative  . Not on file     Family History: The patient's family history includes Colon cancer in his brother; Hypertension in his father and mother; Throat cancer in his sister. ROS:   Please see the history of present illness.    All 14 point review of systems negative except as described per history of present illness  EKGs/Labs/Other Studies Reviewed:      Recent Labs: No results found for requested labs within last 8760 hours.  Recent Lipid Panel No results found for: CHOL, TRIG, HDL, CHOLHDL, VLDL, LDLCALC, LDLDIRECT  Physical Exam:    VS:  BP 130/72 (BP Location: Right Arm, Patient Position: Sitting, Cuff Size: Normal)   Pulse 60   Ht 5\' 8"  (1.727 m)   Wt 180 lb (81.6 kg)   SpO2 98%   BMI 27.37 kg/m     Wt Readings from Last 3 Encounters:  12/13/17 180 lb (81.6 kg)     GEN:  Well nourished, well developed in no  acute distress HEENT: Normal NECK: No JVD; No carotid bruits LYMPHATICS: No lymphadenopathy CARDIAC: RRR, no murmurs, no rubs, no gallops RESPIRATORY:  Clear to auscultation without rales, wheezing or rhonchi  ABDOMEN: Soft, non-tender, non-distended MUSCULOSKELETAL:  No edema; No deformity  SKIN: Warm and dry LOWER EXTREMITIES: no swelling NEUROLOGIC:  Alert and oriented x 3 PSYCHIATRIC:  Normal affect   ASSESSMENT:    1. Pacemaker   2. CLL (chronic lymphocytic leukemia) ([place])   3. Transient complete heart block ([facility])   4. Shortness of breath    PLAN:    In order of problems listed above:  1. Pacemaker next week he does have visiting pacemaker clinic. 2. CLL doing well from that point review with usual adverse outcomes around 50,000 3. Heart block addressed with the pacemaker 4. Shortness of breath so far no clear-cut reason identified for this problem. 5. Urinary bladder problem being follow-up by urology.  See him back in 5 months sooner if he get a problem   Medication Adjustments/Labs and Tests Ordered: Current medicines are reviewed at length with the patient today.  Concerns regarding medicines are outlined above.  No orders of the defined types were placed in this encounter.  Medication changes:  Meds ordered this encounter  Medications  . nitroGLYCERIN (NITROSTAT) 0.4 MG SL tablet    Sig: Place 1 tablet (0.4 mg total) under the tongue every 5 (five) minutes as needed.    Dispense:  25 tablet    Refill:  11    Signed, Michael Liter, MD, [facility] 12/13/2017 5:06 PM    [facility]

## 2017-12-13 NOTE — Patient Instructions (Signed)
Medication Instructions:  Your physician recommends that you continue on your current medications as directed. Please refer to the Current Medication list given to you today.  Refill for nitro given  Labwork: None  Testing/Procedures: None  Follow-Up: Your physician recommends that you schedule a follow-up appointment in: 5 months  Any Other Special Instructions Will Be Listed Below (If Applicable).     If you need a refill on your cardiac medications before your next appointment, please call your pharmacy.   [place], RN, BSN

## 2018-01-05 DIAGNOSIS — Z6827 Body mass index (BMI) 27.0-27.9, adult: Secondary | ICD-10-CM | POA: Diagnosis not present

## 2018-01-05 DIAGNOSIS — L255 Unspecified contact dermatitis due to plants, except food: Secondary | ICD-10-CM | POA: Diagnosis not present

## 2018-01-05 DIAGNOSIS — J309 Allergic rhinitis, unspecified: Secondary | ICD-10-CM | POA: Diagnosis not present

## 2018-01-12 DIAGNOSIS — I442 Atrioventricular block, complete: Secondary | ICD-10-CM | POA: Diagnosis not present

## 2018-01-12 DIAGNOSIS — Z45018 Encounter for adjustment and management of other part of cardiac pacemaker: Secondary | ICD-10-CM | POA: Diagnosis not present

## 2018-01-18 ENCOUNTER — Telehealth: Payer: Self-pay

## 2018-01-18 ENCOUNTER — Other Ambulatory Visit: Payer: Self-pay

## 2018-01-18 DIAGNOSIS — Z950 Presence of cardiac pacemaker: Secondary | ICD-10-CM

## 2018-01-18 NOTE — Telephone Encounter (Signed)
Left voicemail informing patient that order for EP care with [facility] was placed. Recall will be placed for patient to have EP care in [employer] once [place] is in town.

## 2018-01-23 DIAGNOSIS — L820 Inflamed seborrheic keratosis: Secondary | ICD-10-CM | POA: Diagnosis not present

## 2018-01-23 DIAGNOSIS — L237 Allergic contact dermatitis due to plants, except food: Secondary | ICD-10-CM | POA: Diagnosis not present

## 2018-01-23 DIAGNOSIS — L570 Actinic keratosis: Secondary | ICD-10-CM | POA: Diagnosis not present

## 2018-01-23 DIAGNOSIS — D0439 Carcinoma in situ of skin of other parts of face: Secondary | ICD-10-CM | POA: Diagnosis not present

## 2018-01-23 DIAGNOSIS — B353 Tinea pedis: Secondary | ICD-10-CM | POA: Diagnosis not present

## 2018-01-23 DIAGNOSIS — C44329 Squamous cell carcinoma of skin of other parts of face: Secondary | ICD-10-CM | POA: Diagnosis not present

## 2018-02-17 DIAGNOSIS — Z23 Encounter for immunization: Secondary | ICD-10-CM | POA: Diagnosis not present

## 2018-02-17 DIAGNOSIS — D0439 Carcinoma in situ of skin of other parts of face: Secondary | ICD-10-CM | POA: Diagnosis not present

## 2018-03-02 DIAGNOSIS — N390 Urinary tract infection, site not specified: Secondary | ICD-10-CM | POA: Diagnosis not present

## 2018-03-02 DIAGNOSIS — Z125 Encounter for screening for malignant neoplasm of prostate: Secondary | ICD-10-CM | POA: Diagnosis not present

## 2018-03-02 DIAGNOSIS — N401 Enlarged prostate with lower urinary tract symptoms: Secondary | ICD-10-CM | POA: Diagnosis not present

## 2018-03-02 DIAGNOSIS — R339 Retention of urine, unspecified: Secondary | ICD-10-CM | POA: Diagnosis not present

## 2018-03-14 DIAGNOSIS — T50905A Adverse effect of unspecified drugs, medicaments and biological substances, initial encounter: Secondary | ICD-10-CM | POA: Diagnosis not present

## 2018-03-14 DIAGNOSIS — M79642 Pain in left hand: Secondary | ICD-10-CM | POA: Diagnosis not present

## 2018-03-14 DIAGNOSIS — M79641 Pain in right hand: Secondary | ICD-10-CM | POA: Diagnosis not present

## 2018-03-14 DIAGNOSIS — R531 Weakness: Secondary | ICD-10-CM | POA: Diagnosis not present

## 2018-03-14 DIAGNOSIS — M2550 Pain in unspecified joint: Secondary | ICD-10-CM | POA: Diagnosis not present

## 2018-03-16 DIAGNOSIS — R05 Cough: Secondary | ICD-10-CM | POA: Diagnosis not present

## 2018-03-16 DIAGNOSIS — Z0000 Encounter for general adult medical examination without abnormal findings: Secondary | ICD-10-CM | POA: Diagnosis not present

## 2018-03-16 DIAGNOSIS — T50905S Adverse effect of unspecified drugs, medicaments and biological substances, sequela: Secondary | ICD-10-CM | POA: Diagnosis not present

## 2018-03-16 DIAGNOSIS — Z6826 Body mass index (BMI) 26.0-26.9, adult: Secondary | ICD-10-CM | POA: Diagnosis not present

## 2018-03-24 DIAGNOSIS — C9110 Chronic lymphocytic leukemia of B-cell type not having achieved remission: Secondary | ICD-10-CM | POA: Diagnosis not present

## 2018-04-10 ENCOUNTER — Encounter: Payer: Self-pay | Admitting: Cardiology

## 2018-04-30 NOTE — Progress Notes (Signed)
Electrophysiology Office Note   Date:  04/30/2018   ID:  Michael Aguirre, DOB [birth year], MRN [id number]  PCP:  [name], MD  Cardiologist:  Agustin Cree Primary Electrophysiologist:  Xareni Kelch Meredith Leeds, MD    No chief complaint on file.    History of Present Illness: Michael Aguirre is a 77 y.o. male who is being seen today for the evaluation of pacemaker at the request of Jenne Campus. Presenting today for electrophysiology evaluation.  She has a history of transient complete heart block and is status post Medtronic dual-chamber pacemaker.  He is being seen today to establish care for his pacemaker.    Today, he denies symptoms of palpitations, chest pain, shortness of breath, orthopnea, PND, lower extremity edema, claudication, dizziness, presyncope, syncope, bleeding, or neurologic sequela. The patient is tolerating medications without difficulties.    No past medical history on file. Past Surgical History:  Procedure Laterality Date  . INSERT / REPLACE / REMOVE PACEMAKER     Medtronic     Current Outpatient Medications  Medication Sig Dispense Refill  . aspirin EC 81 MG tablet Take 81 mg by mouth as needed.    . finasteride (PROSCAR) 5 MG tablet Take 5 mg by mouth daily.  3  . montelukast (SINGULAIR) 10 MG tablet Take 10 mg by mouth at bedtime.    . nitroGLYCERIN (NITROSTAT) 0.4 MG SL tablet Place 1 tablet (0.4 mg total) under the tongue every 5 (five) minutes as needed. 25 tablet 11  . pantoprazole (PROTONIX) 40 MG tablet Take 1 tablet by mouth daily.  5  . tamsulosin (FLOMAX) 0.4 MG CAPS capsule Take 0.4 mg by mouth 2 (two) times daily.     No current facility-administered medications for this visit.     Allergies:   Tetracycline   Social History:  The patient  reports that he has never smoked. He has never used smokeless tobacco. He reports that he drank alcohol. He reports that he does not use drugs.   Family History:  The patient's family history  includes Colon cancer in his brother; Hypertension in his father and mother; Throat cancer in his sister.    ROS:  Please see the history of present illness.   Otherwise, review of systems is positive for none.   All other systems are reviewed and negative.    PHYSICAL EXAM: VS:  There were no vitals taken for this visit. , BMI There is no height or weight on file to calculate BMI. GEN: Well nourished, well developed, in no acute distress  HEENT: normal  Neck: no JVD, carotid bruits, or masses Cardiac: RRR; no murmurs, rubs, or gallops,no edema  Respiratory:  clear to auscultation bilaterally, normal work of breathing GI: soft, nontender, nondistended, + BS MS: no deformity or atrophy  Skin: warm and dry, device pocket is well healed Neuro:  Strength and sensation are intact Psych: euthymic mood, full affect  EKG:  EKG is not ordered today. Personal review of the ekg ordered 12/13/17 shows SR, rate 60  Device interrogation is reviewed today in detail.  See PaceArt for details.   Recent Labs: No results found for requested labs within last 8760 hours.    Lipid Panel  No results found for: CHOL, TRIG, HDL, CHOLHDL, VLDL, LDLCALC, LDLDIRECT   Wt Readings from Last 3 Encounters:  12/13/17 180 lb (81.6 kg)      Other studies Reviewed: Additional studies/ records that were reviewed today include: Epic notes   ASSESSMENT  AND PLAN:  1.  Transient complete heart block: Status post Medtronic dual-chamber pacemaker.  Is functioning appropriately.  He rarely paces.  10 years left on his battery.  He does not wish to be enrolled in remote monitoring.   Current medicines are reviewed at length with the patient today.   The patient does not have concerns regarding his medicines.  The following changes were made today:  none  Labs/ tests ordered today include:  No orders of the defined types were placed in this encounter.    Disposition:   FU with Breindel Collier 1  year  Signed, Cleopatra Sardo Meredith Leeds, MD  04/30/2018 9:14 PM     [address] [place] [address] [phone] (office) [phone] (fax)

## 2018-05-01 ENCOUNTER — Ambulatory Visit (INDEPENDENT_AMBULATORY_CARE_PROVIDER_SITE_OTHER): Payer: Medicare Other | Admitting: Cardiology

## 2018-05-01 ENCOUNTER — Encounter: Payer: Self-pay | Admitting: Cardiology

## 2018-05-01 VITALS — BP 126/62 | HR 68 | Ht 68.0 in | Wt 180.0 lb

## 2018-05-01 DIAGNOSIS — I442 Atrioventricular block, complete: Secondary | ICD-10-CM

## 2018-05-01 NOTE — Patient Instructions (Addendum)
Medication Instructions:  Your physician recommends that you continue on your current medications as directed. Please refer to the Current Medication list given to you today.  * If you need a refill on your cardiac medications before your next appointment, please call your pharmacy.   Labwork: None ordered  Testing/Procedures: None ordered  Follow-Up: Your physician wants you to follow-up in: 6 months with device clinic in the [place] office. You will receive a reminder letter in the mail two months in advance. If you don't receive a letter, please call our office to schedule the follow-up appointment.  Your physician wants you to follow-up in: 1 year with Dr. Curt Bears.  You will receive a reminder letter in the mail two months in advance. If you don't receive a letter, please call our office to schedule the follow-up appointment.  *Please note that any paperwork needing to be filled out by the provider will need to be addressed at the front desk prior to seeing the provider. Please note that any FMLA, disability or other documents regarding health condition is subject to a $25.00 charge that must be received prior to completion of paperwork in the form of a money order or check.  Thank you for choosing [facility]!!   Trinidad Curet, RN [phone]

## 2018-05-11 LAB — CUP PACEART INCLINIC DEVICE CHECK
Battery Impedance: 392 Ohm
Battery Remaining Longevity: 122 mo
Battery Voltage: 2.79 V
Brady Statistic AP VP Percent: 0 %
Brady Statistic AP VS Percent: 4 %
Brady Statistic AS VP Percent: 1 %
Brady Statistic AS VS Percent: 95 %
Date Time Interrogation Session: 20191125150546
Implantable Lead Implant Date: 20020322
Implantable Lead Implant Date: 20030322
Implantable Lead Location: 753859
Implantable Lead Location: 753860
Implantable Lead Model: 5092
Implantable Lead Model: 5592
Implantable Pulse Generator Implant Date: 20110526
Lead Channel Impedance Value: 407 Ohm
Lead Channel Impedance Value: 703 Ohm
Lead Channel Pacing Threshold Amplitude: 0.875 V
Lead Channel Pacing Threshold Amplitude: 1 V
Lead Channel Pacing Threshold Amplitude: 1.5 V
Lead Channel Pacing Threshold Amplitude: 1.5 V
Lead Channel Pacing Threshold Pulse Width: 0.4 ms
Lead Channel Pacing Threshold Pulse Width: 0.4 ms
Lead Channel Pacing Threshold Pulse Width: 0.4 ms
Lead Channel Pacing Threshold Pulse Width: 0.4 ms
Lead Channel Sensing Intrinsic Amplitude: 1 mV
Lead Channel Sensing Intrinsic Amplitude: 5.6 mV
Lead Channel Setting Pacing Amplitude: 1.75 V
Lead Channel Setting Pacing Amplitude: 3 V
Lead Channel Setting Pacing Pulse Width: 0.4 ms
Lead Channel Setting Sensing Sensitivity: 2 mV

## 2018-05-24 ENCOUNTER — Encounter: Payer: Self-pay | Admitting: Cardiology

## 2018-05-24 ENCOUNTER — Ambulatory Visit (INDEPENDENT_AMBULATORY_CARE_PROVIDER_SITE_OTHER): Payer: Medicare Other | Admitting: Cardiology

## 2018-05-24 VITALS — BP 140/76 | HR 56 | Wt 185.4 lb

## 2018-05-24 DIAGNOSIS — R0602 Shortness of breath: Secondary | ICD-10-CM

## 2018-05-24 DIAGNOSIS — Z950 Presence of cardiac pacemaker: Secondary | ICD-10-CM | POA: Diagnosis not present

## 2018-05-24 DIAGNOSIS — R079 Chest pain, unspecified: Secondary | ICD-10-CM

## 2018-05-24 DIAGNOSIS — R0789 Other chest pain: Secondary | ICD-10-CM | POA: Insufficient documentation

## 2018-05-24 HISTORY — DX: Other chest pain: R07.89

## 2018-05-24 NOTE — Progress Notes (Signed)
Cardiology Office Note:    Date:  05/24/2018   ID:  Michael Aguirre, DOB 23-Dec-1940, MRN [id number]  PCP:  [name], MD  Cardiologist:  Jenne Campus, MD    Referring MD: [name], MD   Chief Complaint  Patient presents with  . Follow-up  Weak and tired like always  History of Present Illness:    Michael Aguirre is a 77 y.o. male with pacemaker secondary to complete heart block.  Biggest problem is the fact that he gets CLL he is complaining of being weak tired exhausted and this is can complain interestingly he described to have chest pain so the pain is a burning-like but usually happens at rest he takes nitroglycerin helps with the pain happens maybe once every 2 weeks or so.  He is able to walk and climb stairs actually recently tried to cut some grass and was able to do it was exhausted for another day after that.  No past medical history on file.  Past Surgical History:  Procedure Laterality Date  . INSERT / REPLACE / REMOVE PACEMAKER     Medtronic    Current Medications: Current Meds  Medication Sig  . aspirin EC 81 MG tablet Take 81 mg by mouth as needed.  . finasteride (PROSCAR) 5 MG tablet Take 5 mg by mouth daily.  . montelukast (SINGULAIR) 10 MG tablet Take 10 mg by mouth at bedtime.  . nitroGLYCERIN (NITROSTAT) 0.4 MG SL tablet Place 1 tablet (0.4 mg total) under the tongue every 5 (five) minutes as needed.  . pantoprazole (PROTONIX) 40 MG tablet Take 1 tablet by mouth daily.  . tamsulosin (FLOMAX) 0.4 MG CAPS capsule Take 0.4 mg by mouth 2 (two) times daily.     Allergies:   Tetracycline   Social History   Socioeconomic History  . Marital status: Single    Spouse name: Not on file  . Number of children: Not on file  . Years of education: Not on file  . Highest education level: Not on file  Occupational History  . Not on file  Social Needs  . Financial resource strain: Not on file  . Food insecurity:    Worry: Not on file    Inability:  Not on file  . Transportation needs:    Medical: Not on file    Non-medical: Not on file  Tobacco Use  . Smoking status: Never Smoker  . Smokeless tobacco: Never Used  Substance and Sexual Activity  . Alcohol use: Not Currently  . Drug use: Never  . Sexual activity: Not on file  Lifestyle  . Physical activity:    Days per week: Not on file    Minutes per session: Not on file  . Stress: Not on file  Relationships  . Social connections:    Talks on phone: Not on file    Gets together: Not on file    Attends religious service: Not on file    Active member of club or organization: Not on file    Attends meetings of clubs or organizations: Not on file    Relationship status: Not on file  Other Topics Concern  . Not on file  Social History Narrative  . Not on file     Family History: The patient's family history includes Colon cancer in his brother; Hypertension in his father and mother; Throat cancer in his sister. ROS:   Please see the history of present illness.    All 14  point review of systems negative except as described per history of present illness  EKGs/Labs/Other Studies Reviewed:      Recent Labs: No results found for requested labs within last 8760 hours.  Recent Lipid Panel No results found for: CHOL, TRIG, HDL, CHOLHDL, VLDL, LDLCALC, LDLDIRECT  Physical Exam:    VS:  BP 140/76   Pulse (!) 56   Wt 185 lb 6.4 oz (84.1 kg)   SpO2 96%   BMI 28.19 kg/m     Wt Readings from Last 3 Encounters:  05/24/18 185 lb 6.4 oz (84.1 kg)  05/01/18 180 lb (81.6 kg)  12/13/17 180 lb (81.6 kg)     GEN:  Well nourished, well developed in no acute distress HEENT: Normal NECK: No JVD; No carotid bruits LYMPHATICS: No lymphadenopathy CARDIAC: RRR, no murmurs, no rubs, no gallops RESPIRATORY:  Clear to auscultation without rales, wheezing or rhonchi  ABDOMEN: Soft, non-tender, non-distended MUSCULOSKELETAL:  No edema; No deformity  SKIN: Warm and dry LOWER  EXTREMITIES: no swelling NEUROLOGIC:  Alert and oriented x 3 PSYCHIATRIC:  Normal affect   ASSESSMENT:    1. Chest pain, unspecified type   2. Pacemaker   3. Atypical chest pain   4. Shortness of breath    PLAN:    In order of problems listed above:  1. Chest pain does have as needed nitroglycerin which will continue ask him to have a stress test done. 2. Pacemaker device recently interrogated functioning properly we will continue present management. 3. Atypical chest pain again stress test will be done 4. Shortness of breath echocardiogram showed preserved ejection fraction 5. Cholesterol profile I will ask him to do   Medication Adjustments/Labs and Tests Ordered: Current medicines are reviewed at length with the patient today.  Concerns regarding medicines are outlined above.  Orders Placed This Encounter  Procedures  . Hepatic function panel  . Lipid panel  . MYOCARDIAL PERFUSION IMAGING   Medication changes: No orders of the defined types were placed in this encounter.   Signed, Park Liter, MD, [facility] 05/24/2018 4:55 PM    [facility]

## 2018-05-24 NOTE — Patient Instructions (Signed)
Medication Instructions:  Your physician recommends that you continue on your current medications as directed. Please refer to the Current Medication list given to you today.  If you need a refill on your cardiac medications before your next appointment, please call your pharmacy.   Lab work: Your physician recommends that you return for lab work today: Lipids, lft  If you have labs (blood work) drawn today and your tests are completely normal, you will receive your results only by: Marland Kitchen MyChart Message (if you have MyChart) OR . A paper copy in the mail If you have any lab test that is abnormal or we need to change your treatment, we will call you to review the results.  Testing/Procedures: Your physician has requested that you have a lexiscan myoview. For further information please visit [email or web address]. Please follow instruction sheet, as given.    Follow-Up: At [facility], you and your health needs are our priority.  As part of our continuing mission to provide you with exceptional heart care, we have created designated Provider Care Teams.  These Care Teams include your primary Cardiologist (physician) and Advanced Practice Providers (APPs -  Physician Assistants and Nurse Practitioners) who all work together to provide you with the care you need, when you need it. You will need a follow up appointment in 5 months.  Please call our office 2 months in advance to schedule this appointment.  You may see No primary care provider on file. or another member of our [employer] Provider Team in [place]: Shirlee More, MD . Jyl Heinz, MD  Any Other Special Instructions Will Be Listed Below (If Applicable).   Cardiac Nuclear Scan A cardiac nuclear scan is a test that measures blood flow to the heart when a person is resting and when he or she is exercising. The test looks for problems such as:  Not enough blood reaching a portion of the heart.  The heart muscle not working  normally. You may need this test if:  You have heart disease.  You have had abnormal lab results.  You have had heart surgery or a balloon procedure to open up blocked arteries (angioplasty).  You have chest pain.  You have shortness of breath. In this test, a radioactive dye (tracer) is injected into your bloodstream. After the tracer has traveled to your heart, an imaging device is used to measure how much of the tracer is absorbed by or distributed to various areas of your heart. This procedure is usually done at a hospital and takes 2-4 hours. Tell a health care provider about:  Any allergies you have.  All medicines you are taking, including vitamins, herbs, eye drops, creams, and over-the-counter medicines.  Any problems you or family members have had with anesthetic medicines.  Any blood disorders you have.  Any surgeries you have had.  Any medical conditions you have.  Whether you are pregnant or may be pregnant. What are the risks? Generally, this is a safe procedure. However, problems may occur, including:  Serious chest pain and heart attack. This is only a risk if the stress portion of the test is done.  Rapid heartbeat.  Sensation of warmth in your chest. This usually passes quickly.  Allergic reaction to the tracer. What happens before the procedure?  Ask your health care provider about changing or stopping your regular medicines. This is especially important if you are taking diabetes medicines or blood thinners.  Follow instructions from your health care provider about eating or drinking  restrictions.  Remove your jewelry on the day of the procedure. What happens during the procedure?  An IV will be inserted into one of your veins.  Your health care provider will inject a small amount of radioactive tracer through the IV.  You will wait for 20-40 minutes while the tracer travels through your bloodstream.  Your heart activity will be monitored with  an electrocardiogram (ECG).  You will lie down on an exam table.  Images of your heart will be taken for about 15-20 minutes.  You may also have a stress test. For this test, one of the following may be done: ? You will exercise on a treadmill or stationary bike. While you exercise, your heart's activity will be monitored with an ECG, and your blood pressure will be checked. ? You will be given medicines that will increase blood flow to parts of your heart. This is done if you are unable to exercise.  When blood flow to your heart has peaked, a tracer will again be injected through the IV.  After 20-40 minutes, you will get back on the exam table and have more images taken of your heart.  Depending on the type of tracer used, scans may need to be repeated 3-4 hours later.  Your IV line will be removed when the procedure is over. The procedure may vary among health care providers and hospitals. What happens after the procedure?  Unless your health care provider tells you otherwise, you may return to your normal schedule, including diet, activities, and medicines.  Unless your health care provider tells you otherwise, you may increase your fluid intake. This will help to flush the contrast dye from your body. Drink enough fluid to keep your urine pale yellow.  Ask your health care provider, or the department that is doing the test: ? When will my results be ready? ? How will I get my results? Summary  A cardiac nuclear scan measures the blood flow to the heart when a person is resting and when he or she is exercising.  Tell your health care provider if you are pregnant.  Before the procedure, ask your health care provider about changing or stopping your regular medicines. This is especially important if you are taking diabetes medicines or blood thinners.  After the procedure, unless your health care provider tells you otherwise, increase your fluid intake. This will help flush the  contrast dye from your body.  After the procedure, unless your health care provider tells you otherwise, you may return to your normal schedule, including diet, activities, and medicines. This information is not intended to replace advice given to you by your health care provider. Make sure you discuss any questions you have with your health care provider. Document Released: 06/18/2004 Document Revised: 11/07/2017 Document Reviewed: 11/07/2017 Elsevier Interactive Patient Education  2019 [employer].

## 2018-05-25 LAB — LIPID PANEL
CHOLESTEROL TOTAL: 217 mg/dL — AB (ref 100–199)
Chol/HDL Ratio: 6.2 ratio — ABNORMAL HIGH (ref 0.0–5.0)
HDL: 35 mg/dL — AB (ref >39)
LDL CALC: 139 mg/dL — AB (ref 0–99)
TRIGLYCERIDES: 217 mg/dL — AB (ref 0–149)
VLDL CHOLESTEROL CAL: 43 mg/dL — AB (ref 5–40)

## 2018-05-25 LAB — HEPATIC FUNCTION PANEL
ALT: 12 IU/L (ref 0–44)
AST: 19 IU/L (ref 0–40)
Albumin: 3.9 g/dL (ref 3.5–4.8)
Alkaline Phosphatase: 83 IU/L (ref 39–117)
BILIRUBIN, DIRECT: 0.1 mg/dL (ref 0.00–0.40)
Bilirubin Total: 0.4 mg/dL (ref 0.0–1.2)
TOTAL PROTEIN: 5.5 g/dL — AB (ref 6.0–8.5)

## 2018-05-26 ENCOUNTER — Telehealth: Payer: Self-pay | Admitting: Emergency Medicine

## 2018-05-26 DIAGNOSIS — E7800 Pure hypercholesterolemia, unspecified: Secondary | ICD-10-CM

## 2018-05-26 MED ORDER — PRAVASTATIN SODIUM 20 MG PO TABS: 20.0000 mg | ORAL_TABLET | Freq: Every evening | ORAL | 1 refills | Status: DC

## 2018-05-26 NOTE — Telephone Encounter (Signed)
Patient's wife informed of lab results. Informed for patient to start pravastatin 20 mg daily and have labs rechecked in 6 weeks. She verbally understands and will inform patient.

## 2018-06-12 ENCOUNTER — Telehealth (HOSPITAL_COMMUNITY): Payer: Self-pay | Admitting: *Deleted

## 2018-06-12 NOTE — Telephone Encounter (Signed)
Patient given detailed instructions per Myocardial Perfusion Study Information Sheet for the test on 06/14/17 Patient notified to arrive 15 minutes early and that it is imperative to arrive on time for appointment to keep from having the test rescheduled.  If you need to cancel or reschedule your appointment, please call the office within 24 hours of your appointment. . Patient verbalized understanding. Ranee Peasley Jacqueline    

## 2018-06-14 ENCOUNTER — Ambulatory Visit (INDEPENDENT_AMBULATORY_CARE_PROVIDER_SITE_OTHER): Payer: Medicare Other

## 2018-06-14 VITALS — Ht 68.0 in | Wt 185.0 lb

## 2018-06-14 DIAGNOSIS — R079 Chest pain, unspecified: Secondary | ICD-10-CM

## 2018-06-14 DIAGNOSIS — C9110 Chronic lymphocytic leukemia of B-cell type not having achieved remission: Secondary | ICD-10-CM

## 2018-06-14 DIAGNOSIS — I442 Atrioventricular block, complete: Secondary | ICD-10-CM | POA: Diagnosis not present

## 2018-06-14 MED ORDER — REGADENOSON 0.4 MG/5ML IV SOLN
0.4000 mg | Freq: Once | INTRAVENOUS | Status: AC
  Administered 2018-06-14: 0.4 mg via INTRAVENOUS

## 2018-06-14 MED ORDER — TECHNETIUM TC 99M TETROFOSMIN IV KIT
29.9000 | PACK | Freq: Once | INTRAVENOUS | Status: AC | PRN
  Administered 2018-06-14: 29.9 via INTRAVENOUS

## 2018-06-14 MED ORDER — TECHNETIUM TC 99M TETROFOSMIN IV KIT
10.1000 | PACK | Freq: Once | INTRAVENOUS | Status: AC | PRN
  Administered 2018-06-14: 10.1 via INTRAVENOUS

## 2018-06-15 LAB — MYOCARDIAL PERFUSION IMAGING
CHL CUP NUCLEAR SDS: 1
CHL CUP NUCLEAR SRS: 3
CHL CUP NUCLEAR SSS: 4
LV dias vol: 89 mL (ref 62–150)
LVSYSVOL: 31 mL
NUC STRESS TID: 1.09
Peak HR: 75 {beats}/min
Rest HR: 52 {beats}/min

## 2018-06-19 ENCOUNTER — Telehealth: Payer: Self-pay | Admitting: Cardiology

## 2018-06-19 MED ORDER — NITROGLYCERIN 0.4 MG SL SUBL: 0.4000 mg | SUBLINGUAL_TABLET | SUBLINGUAL | 0 refills | Status: DC | PRN

## 2018-06-19 NOTE — Telephone Encounter (Signed)
Nitroglycerin refilled

## 2018-06-19 NOTE — Telephone Encounter (Signed)
Patient changed new insurance first of January, can we send a small script for nitroglycerine to [facility] on [address] in [place] for 25 0.4mg  please

## 2018-09-27 ENCOUNTER — Telehealth: Payer: Self-pay | Admitting: Cardiology

## 2018-09-27 NOTE — Telephone Encounter (Signed)
Virtual Visit Pre-Appointment Phone Call  "(Name), I am calling you today to discuss your upcoming appointment. We are currently trying to limit exposure to the virus that causes COVID-19 by seeing patients at home rather than in the office."  1. "What is the BEST phone number to call the day of the visit?" - include this in appointment notes  2. Do you have or have access to (through a family member/friend) a smartphone with video capability that we can use for your visit?" a. If yes - list this number in appt notes as cell (if different from BEST phone #) and list the appointment type as a VIDEO visit in appointment notes b. If no - list the appointment type as a PHONE visit in appointment notes  3. Confirm consent - "In the setting of the current Covid19 crisis, you are scheduled for a (phone or video) visit with your provider on (date) at (time).  Just as we do with many in-office visits, in order for you to participate in this visit, we must obtain consent.  If you'd like, I can send this to your mychart (if signed up) or email for you to review.  Otherwise, I can obtain your verbal consent now.  All virtual visits are billed to your insurance company just like a normal visit would be.  By agreeing to a virtual visit, we'd like you to understand that the technology does not allow for your provider to perform an examination, and thus may limit your provider's ability to fully assess your condition. If your provider identifies any concerns that need to be evaluated in person, we will make arrangements to do so.  Finally, though the technology is pretty good, we cannot assure that it will always work on either your or our end, and in the setting of a video visit, we may have to convert it to a phone-only visit.  In either situation, we cannot ensure that we have a secure connection.  Are you willing to proceed?" STAFF: Did the patient verbally acknowledge consent to telehealth visit? Document  YES/NO here: YES  4. Advise patient to be prepared - "Two hours prior to your appointment, go ahead and check your blood pressure, pulse, oxygen saturation, and your weight (if you have the equipment to check those) and write them all down. When your visit starts, your provider will ask you for this information. If you have an Apple Watch or Kardia device, please plan to have heart rate information ready on the day of your appointment. Please have a pen and paper handy nearby the day of the visit as well."  5. Give patient instructions for MyChart download to smartphone OR Doximity/Doxy.me as below if video visit (depending on what platform provider is using)  6. Inform patient they will receive a phone call 15 minutes prior to their appointment time (may be from unknown caller ID) so they should be prepared to answer    TELEPHONE CALL NOTE  Michael Aguirre has been deemed a candidate for a follow-up tele-health visit to limit community exposure during the Covid-19 pandemic. I spoke with the patient via phone to ensure availability of phone/video source, confirm preferred email & phone number, and discuss instructions and expectations.  I reminded Michael Aguirre to be prepared with any vital sign and/or heart rhythm information that could potentially be obtained via home monitoring, at the time of his visit. I reminded Michael Aguirre to expect a phone call prior to  his visit.  Michael Aguirre 09/27/2018 10:01 AM   INSTRUCTIONS FOR DOWNLOADING THE MYCHART APP TO SMARTPHONE  - The patient must first make sure to have activated MyChart and know their login information - If Apple, go to [employer] and type in MyChart in the search bar and download the app. If Android, ask patient to go to [employer] and type in [place] in the search bar and download the app. The app is free but as with any other app downloads, their phone may require them to verify saved payment information or  Apple/Android password.  - The patient will need to then log into the app with their MyChart username and password, and select [facility] as their healthcare provider to link the account. When it is time for your visit, go to the MyChart app, find appointments, and click Begin Video Visit. Be sure to Select Allow for your device to access the Microphone and Camera for your visit. You will then be connected, and your provider will be with you shortly.  **If they have any issues connecting, or need assistance please contact MyChart service desk [phone])**  **If using a computer, in order to ensure the best quality for their visit they will need to use either of the following Internet Browsers: [employer], or Google Chrome**  IF USING DOXIMITY or DOXY.ME - The patient will receive a link just prior to their visit by text.     FULL LENGTH CONSENT FOR TELE-HEALTH VISIT   I hereby voluntarily request, consent and authorize [facility] and its employed or contracted physicians, physician assistants, nurse practitioners or other licensed health care professionals (the Practitioner), to provide me with telemedicine health care services (the Services") as deemed necessary by the treating Practitioner. I acknowledge and consent to receive the Services by the Practitioner via telemedicine. I understand that the telemedicine visit will involve communicating with the Practitioner through live audiovisual communication technology and the disclosure of certain medical information by electronic transmission. I acknowledge that I have been given the opportunity to request an in-person assessment or other available alternative prior to the telemedicine visit and am voluntarily participating in the telemedicine visit.  I understand that I have the right to withhold or withdraw my consent to the use of telemedicine in the course of my care at any time, without affecting my right to future care  or treatment, and that the Practitioner or I may terminate the telemedicine visit at any time. I understand that I have the right to inspect all information obtained and/or recorded in the course of the telemedicine visit and may receive copies of available information for a reasonable fee.  I understand that some of the potential risks of receiving the Services via telemedicine include:   Delay or interruption in medical evaluation due to technological equipment failure or disruption;  Information transmitted may not be sufficient (e.g. poor resolution of images) to allow for appropriate medical decision making by the Practitioner; and/or   In rare instances, security protocols could fail, causing a breach of personal health information.  Furthermore, I acknowledge that it is my responsibility to provide information about my medical history, conditions and care that is complete and accurate to the best of my ability. I acknowledge that Practitioner's advice, recommendations, and/or decision may be based on factors not within their control, such as incomplete or inaccurate data provided by me or distortions of diagnostic images or specimens that may result from electronic transmissions. I  understand that the practice of medicine is not an exact science and that Practitioner makes no warranties or guarantees regarding treatment outcomes. I acknowledge that I will receive a copy of this consent concurrently upon execution via email to the email address I last provided but may also request a printed copy by calling the office of [facility].    I understand that my insurance will be billed for this visit.   I have read or had this consent read to me.  I understand the contents of this consent, which adequately explains the benefits and risks of the Services being provided via telemedicine.   I have been provided ample opportunity to ask questions regarding this consent and the Services and have had  my questions answered to my satisfaction.  I give my informed consent for the services to be provided through the use of telemedicine in my medical care  By participating in this telemedicine visit I agree to the above.

## 2018-10-12 ENCOUNTER — Other Ambulatory Visit: Payer: Self-pay

## 2018-10-12 ENCOUNTER — Encounter: Payer: Self-pay | Admitting: Cardiology

## 2018-10-12 ENCOUNTER — Telehealth (INDEPENDENT_AMBULATORY_CARE_PROVIDER_SITE_OTHER): Payer: Medicare Other | Admitting: Cardiology

## 2018-10-12 DIAGNOSIS — R0789 Other chest pain: Secondary | ICD-10-CM

## 2018-10-12 DIAGNOSIS — C9110 Chronic lymphocytic leukemia of B-cell type not having achieved remission: Secondary | ICD-10-CM

## 2018-10-12 DIAGNOSIS — Z950 Presence of cardiac pacemaker: Secondary | ICD-10-CM

## 2018-10-12 DIAGNOSIS — R0602 Shortness of breath: Secondary | ICD-10-CM

## 2018-10-12 MED ORDER — ISOSORBIDE MONONITRATE ER 30 MG PO TB24: 30.0000 mg | ORAL_TABLET | Freq: Every day | ORAL | 1 refills | Status: DC

## 2018-10-12 NOTE — Patient Instructions (Signed)
Medication Instructions:  Your physician has recommended you make the following change in your medication:   Start: imdur 30 mg daily   If you need a refill on your cardiac medications before your next appointment, please call your pharmacy.   Lab work: None.  If you have labs (blood work) drawn today and your tests are completely normal, you will receive your results only by: Marland Kitchen MyChart Message (if you have MyChart) OR . A paper copy in the mail If you have any lab test that is abnormal or we need to change your treatment, we will call you to review the results.  Testing/Procedures: None.   Follow-Up: At [facility], you and your health needs are our priority.  As part of our continuing mission to provide you with exceptional heart care, we have created designated Provider Care Teams.  These Care Teams include your primary Cardiologist (physician) and Advanced Practice Providers (APPs -  Physician Assistants and Nurse Practitioners) who all work together to provide you with the care you need, when you need it. You will need a follow up appointment in 3 months.  Please call our office 2 months in advance to schedule this appointment.  You may see No primary care provider on file. or another member of our [employer] Provider Team in : Shirlee More, MD . Jyl Heinz, MD  Any Other Special Instructions Will Be Listed Below (If Applicable).  Isosorbide Mononitrate extended-release tablets What is this medicine? ISOSORBIDE MONONITRATE (eye soe SOR bide mon oh NYE trate) is a vasodilator. It relaxes blood vessels, increasing the blood and oxygen supply to your heart. This medicine is used to prevent chest pain caused by angina. It will not help to stop an episode of chest pain. This medicine may be used for other purposes; ask your health care provider or pharmacist if you have questions. COMMON BRAND NAME(S): Imdur, Isotrate ER What should I tell my health care provider  before I take this medicine? They need to know if you have any of these conditions: -previous heart attack or heart failure -an unusual or allergic reaction to isosorbide mononitrate, nitrates, other medicines, foods, dyes, or preservatives -pregnant or trying to get pregnant -breast-feeding How should I use this medicine? Take this medicine by mouth with a glass of water. Follow the directions on the prescription label. Do not crush or chew. Take your medicine at regular intervals. Do not take your medicine more often than directed. Do not stop taking this medicine except on the advice of your doctor or health care professional. Talk to your pediatrician regarding the use of this medicine in children. Special care may be needed. Overdosage: If you think you have taken too much of this medicine contact a poison control center or emergency room at once. NOTE: This medicine is only for you. Do not share this medicine with others. What if I miss a dose? If you miss a dose, take it as soon as you can. If it is almost time for your next dose, take only that dose. Do not take double or extra doses. What may interact with this medicine? Do not take this medicine with any of the following medications: -medicines used to treat erectile dysfunction (ED) like avanafil, sildenafil, tadalafil, and vardenafil -riociguat This medicine may also interact with the following medications: -medicines for high blood pressure -other medicines for angina or heart failure This list may not describe all possible interactions. Give your health care provider a list of all the  medicines, herbs, non-prescription drugs, or dietary supplements you use. Also tell them if you smoke, drink alcohol, or use illegal drugs. Some items may interact with your medicine. What should I watch for while using this medicine? Check your heart rate and blood pressure regularly while you are taking this medicine. Ask your doctor or health care  professional what your heart rate and blood pressure should be and when you should contact him or her. Tell your doctor or health care professional if you feel your medicine is no longer working. You may get dizzy. Do not drive, use machinery, or do anything that needs mental alertness until you know how this medicine affects you. To reduce the risk of dizzy or fainting spells, do not sit or stand up quickly, especially if you are an older patient. Alcohol can make you more dizzy, and increase flushing and rapid heartbeats. Avoid alcoholic drinks. Do not treat yourself for coughs, colds, or pain while you are taking this medicine without asking your doctor or health care professional for advice. Some ingredients may increase your blood pressure. What side effects may I notice from receiving this medicine? Side effects that you should report to your doctor or health care professional as soon as possible: -bluish discoloration of lips, fingernails, or palms of hands -irregular heartbeat, palpitations -low blood pressure -nausea, vomiting -persistent headache -unusually weak or tired Side effects that usually do not require medical attention (report to your doctor or health care professional if they continue or are bothersome): -flushing of the face or neck -rash This list may not describe all possible side effects. Call your doctor for medical advice about side effects. You may report side effects to FDA at 1-800-FDA-1088. Where should I keep my medicine? Keep out of the reach of children. Store between 15 and 30 degrees C (59 and 86 degrees F). Keep container tightly closed. Throw away any unused medicine after the expiration date. NOTE: This sheet is a summary. It may not cover all possible information. If you have questions about this medicine, talk to your doctor, pharmacist, or health care provider.  2019 Elsevier/Gold Standard (2013-03-23 14:48:19)

## 2018-10-12 NOTE — Progress Notes (Signed)
Virtual Visit via Telephone Note   This visit type was conducted due to national recommendations for restrictions regarding the COVID-19 Pandemic (e.g. social distancing) in an effort to limit this patient's exposure and mitigate transmission in our community.  Due to his co-morbid illnesses, this patient is at least at moderate risk for complications without adequate follow up.  This format is felt to be most appropriate for this patient at this time.  The patient did not have access to video technology/had technical difficulties with video requiring transitioning to audio format only (telephone).  All issues noted in this document were discussed and addressed.  No physical exam could be performed with this format.  Please refer to the patient's chart for his  consent to telehealth for [facility].  Evaluation Performed:  Follow-up visit  This visit type was conducted due to national recommendations for restrictions regarding the COVID-19 Pandemic (e.g. social distancing).  This format is felt to be most appropriate for this patient at this time.  All issues noted in this document were discussed and addressed.  No physical exam was performed (except for noted visual exam findings with Video Visits).  Please refer to the patient's chart (MyChart message for video visits and phone note for telephone visits) for the patient's consent to telehealth for [facility]  Date:  10/12/2018  ID: Michael Aguirre, DOB [birth year], MRN [id number]   Patient Location: [address] [place] [address]   Provider location:   [facility]  PCP:  [name], MD  Cardiologist:  Jenne Campus, MD     Chief Complaint: Doing well but still have some chest pain  History of Present Illness:    Michael Aguirre is a 78 y.o. male  who presents via audio/video conferencing for a telehealth visit today.  With chest pain that is relieved by nitroglycerin.  He did have a stress test done  stress test showed no evidence of ischemia he also does CLL as well as dyslipidemia overall he said he does not do much however, when I called him today he was washing his car he said it usually takes him 3 days to wash his car.  Otherwise he also try to cut some grass and likewise he tells me that he take few days to do it.   The patient does not have symptoms concerning for COVID-19 infection (fever, chills, cough, or new SHORTNESS OF BREATH).    Prior CV studies:   The following studies were reviewed today:  Stress test showed no evidence of ischemia     No past medical history on file.  Past Surgical History:  Procedure Laterality Date   INSERT / REPLACE / REMOVE PACEMAKER     [employer]     Current Meds  Medication Sig   finasteride (PROSCAR) 5 MG tablet Take 5 mg by mouth daily.   montelukast (SINGULAIR) 10 MG tablet Take 10 mg by mouth at bedtime.   nitroGLYCERIN (NITROSTAT) 0.4 MG SL tablet Place 1 tablet (0.4 mg total) under the tongue every 5 (five) minutes as needed.   pantoprazole (PROTONIX) 40 MG tablet Take 1 tablet by mouth daily.   tamsulosin (FLOMAX) 0.4 MG CAPS capsule Take 0.4 mg by mouth 2 (two) times daily.      Family History: The patient's family history includes Colon cancer in his brother; Hypertension in his father and mother; Throat cancer in his sister.   ROS:   Please see the  history of present illness.     All other systems reviewed and are negative.   Labs/Other Tests and Data Reviewed:     Recent Labs: 05/24/2018: ALT 12  Recent Lipid Panel    Component Value Date/Time   CHOL 217 (H) 05/24/2018 1704   TRIG 217 (H) 05/24/2018 1704   HDL 35 (L) 05/24/2018 1704   CHOLHDL 6.2 (H) 05/24/2018 1704   LDLCALC 139 (H) 05/24/2018 1704      Exam:    Vital Signs:  There were no vitals taken for this visit.    Wt Readings from Last 3 Encounters:  06/14/18 185 lb (83.9 kg)  05/24/18 185 lb 6.4 oz (84.1 kg)  05/01/18 180 lb  (81.6 kg)     Well nourished, well developed in no acute distress. Alert awake oriented x3 tearful on the phone happy to be able to talk to me.  Diagnosis for this visit:   1. Atypical chest pain   2. Pacemaker   3. Shortness of breath   4. CLL (chronic lymphocytic leukemia) ([place])      ASSESSMENT & PLAN:    1.  Atypical chest pain.  Relieved by nitroglycerin.  I will ask him to start taking Imdur 30 mg every single day we call him 3 months later to see how he responds to this therapy 2.  Pacemaker interrogation reviewed 10 years left in the device there is some excessive nausea in the atrial channel but ventricular channel is functioning properly.  Mild adjustment has been made. 3.  Shortness of breath denies having any significant now. 4.  CLL followed by internal medicine team and oncology  COVID-19 Education: The signs and symptoms of COVID-19 were discussed with the patient and how to seek care for testing (follow up with PCP or arrange E-visit).  The importance of social distancing was discussed today.  Patient Risk:   After full review of this patients clinical status, I feel that they are at least moderate risk at this time.  Time:   Today, I have spent 16 minutes with the patient with telehealth technology discussing pt health issues.  I spent 37minutes reviewing her chart before the visit.  Visit was finished at 4:31pm.    Medication Adjustments/Labs and Tests Ordered: Current medicines are reviewed at length with the patient today.  Concerns regarding medicines are outlined above.  No orders of the defined types were placed in this encounter.  Medication changes: No orders of the defined types were placed in this encounter.    Disposition: 4:31 PM  Signed, Park Liter, MD, [facility] 10/12/2018 4:32 PM    [place]

## 2018-11-27 DIAGNOSIS — C9110 Chronic lymphocytic leukemia of B-cell type not having achieved remission: Secondary | ICD-10-CM

## 2018-12-17 ENCOUNTER — Encounter (HOSPITAL_BASED_OUTPATIENT_CLINIC_OR_DEPARTMENT_OTHER): Payer: Self-pay | Admitting: *Deleted

## 2018-12-17 ENCOUNTER — Other Ambulatory Visit: Payer: Self-pay

## 2018-12-17 ENCOUNTER — Emergency Department (HOSPITAL_BASED_OUTPATIENT_CLINIC_OR_DEPARTMENT_OTHER): Payer: Medicare Other

## 2018-12-17 ENCOUNTER — Emergency Department (HOSPITAL_BASED_OUTPATIENT_CLINIC_OR_DEPARTMENT_OTHER)
Admission: EM | Admit: 2018-12-17 | Discharge: 2018-12-17 | Discharge status: Against Medical Advice | Payer: Medicare Other | Attending: Emergency Medicine | Admitting: Emergency Medicine

## 2018-12-17 DIAGNOSIS — Z950 Presence of cardiac pacemaker: Secondary | ICD-10-CM | POA: Insufficient documentation

## 2018-12-17 DIAGNOSIS — Z79899 Other long term (current) drug therapy: Secondary | ICD-10-CM | POA: Insufficient documentation

## 2018-12-17 DIAGNOSIS — R4781 Slurred speech: Secondary | ICD-10-CM | POA: Diagnosis not present

## 2018-12-17 DIAGNOSIS — R4182 Altered mental status, unspecified: Secondary | ICD-10-CM | POA: Diagnosis present

## 2018-12-17 DIAGNOSIS — Z5320 Procedure and treatment not carried out because of patient's decision for unspecified reasons: Secondary | ICD-10-CM | POA: Insufficient documentation

## 2018-12-17 HISTORY — DX: Sciatica, unspecified side: M54.30

## 2018-12-17 HISTORY — DX: Chronic lymphocytic leukemia of B-cell type not having achieved remission: C91.10

## 2018-12-17 LAB — CBC WITH DIFFERENTIAL/PLATELET
Abs Immature Granulocytes: 0.05 10*3/uL (ref 0.00–0.07)
Basophils Absolute: 0.1 10*3/uL (ref 0.0–0.1)
Basophils Relative: 0 %
Eosinophils Absolute: 0.2 10*3/uL (ref 0.0–0.5)
Eosinophils Relative: 0 %
HCT: 36.7 % — ABNORMAL LOW (ref 39.0–52.0)
Hemoglobin: 11.5 g/dL — ABNORMAL LOW (ref 13.0–17.0)
Immature Granulocytes: 0 %
Lymphocytes Relative: 93 %
Lymphs Abs: 47.4 10*3/uL — ABNORMAL HIGH (ref 0.7–4.0)
MCH: 30.7 pg (ref 26.0–34.0)
MCHC: 31.3 g/dL (ref 30.0–36.0)
MCV: 98.1 fL (ref 80.0–100.0)
Monocytes Absolute: 1.9 10*3/uL — ABNORMAL HIGH (ref 0.1–1.0)
Monocytes Relative: 4 %
Neutro Abs: 1.3 10*3/uL — ABNORMAL LOW (ref 1.7–7.7)
Neutrophils Relative %: 3 %
Platelets: 112 10*3/uL — ABNORMAL LOW (ref 150–400)
RBC: 3.74 MIL/uL — ABNORMAL LOW (ref 4.22–5.81)
RDW: 13.7 % (ref 11.5–15.5)
WBC: 50.9 10*3/uL (ref 4.0–10.5)
nRBC: 0 % (ref 0.0–0.2)

## 2018-12-17 LAB — COMPREHENSIVE METABOLIC PANEL
ALT: 12 U/L (ref 0–44)
AST: 16 U/L (ref 15–41)
Albumin: 3.3 g/dL — ABNORMAL LOW (ref 3.5–5.0)
Alkaline Phosphatase: 63 U/L (ref 38–126)
Anion gap: 8 (ref 5–15)
BUN: 23 mg/dL (ref 8–23)
CO2: 27 mmol/L (ref 22–32)
Calcium: 8.5 mg/dL — ABNORMAL LOW (ref 8.9–10.3)
Chloride: 107 mmol/L (ref 98–111)
Creatinine, Ser: 1 mg/dL (ref 0.61–1.24)
GFR calc Af Amer: >60 mL/min (ref >60)
GFR calc non Af Amer: >60 mL/min (ref >60)
Glucose, Bld: 98 mg/dL (ref 70–99)
Potassium: 4.1 mmol/L (ref 3.5–5.1)
Sodium: 142 mmol/L (ref 135–145)
Total Bilirubin: 0.4 mg/dL (ref 0.3–1.2)
Total Protein: 5.6 g/dL — ABNORMAL LOW (ref 6.5–8.1)

## 2018-12-17 LAB — LIPASE, BLOOD: Lipase: 30 U/L (ref 11–51)

## 2018-12-17 LAB — CBG MONITORING, ED: Glucose-Capillary: 89 mg/dL (ref 70–99)

## 2018-12-17 MED ORDER — ROSUVASTATIN CALCIUM 20 MG PO TABS: 20.0000 mg | ORAL_TABLET | Freq: Every day | ORAL | 0 refills | Status: DC

## 2018-12-17 NOTE — ED Notes (Signed)
Pt d/c home with his wife. His wife is at bedside and verbalized understanding of d/c instructions, prescriptions, and F/U care with his PCP tomorrow. Pt taken to car via [facility] by ED staff. Alert

## 2018-12-17 NOTE — ED Notes (Signed)
Date and time results received: 12/17/18 ..9:48 PM  EDP was notified at 2049 but did not officially result until now. Already aware (use smartphrase ".now" to insert current time)  Test: WBC Critical Value: 50.9  Name of Provider Notified: Lennice Sites, DO  Orders Received? Or Actions Taken?:

## 2018-12-17 NOTE — ED Triage Notes (Signed)
pts wife reports pt had slurred speech, weakness and confusion yesterday that lasted approx 20 seconds.  No deficits noted at this time. pts wife reports pt has been weak today-pt ambulatory into the dept.  A/O at this time. No droop or drift noted.

## 2018-12-17 NOTE — ED Notes (Signed)
Date and time results received: 12/17/18 .Marland Kitchen8:50 PM  (use smartphrase ".now" to insert current time)  Test: WBC Critical Value: 51,000  Name of Provider Notified: Lennice Sites, DO  Orders Received? Or Actions Taken?:

## 2018-12-17 NOTE — ED Notes (Signed)
ED Provider at bedside. 

## 2018-12-17 NOTE — ED Notes (Addendum)
ED Provider at bedside and aware pt has a hx of CLL

## 2018-12-17 NOTE — ED Provider Notes (Signed)
[facility] EMERGENCY DEPARTMENT Provider Note   CSN: [id number] Arrival date & time: 12/17/18  8144    History   Chief Complaint Chief Complaint  Patient presents with  . Altered Mental Status    HPI Michael Aguirre is a 78 y.o. male.     The history is provided by the patient.  Neurologic Problem This is a new problem. The current episode started yesterday. The problem occurs rarely. The problem has been resolved. Pertinent negatives include no chest pain, no abdominal pain, no headaches and no shortness of breath. Associated symptoms comments: Speech problem for several minutes yesterday (36 hours). Nothing aggravates the symptoms. Nothing relieves the symptoms. He has tried nothing for the symptoms. The treatment provided no relief.    Past Medical History:  Diagnosis Date  . CLL (chronic lymphocytic leukemia) ([place])   . Sciatic nerve pain     Patient Active Problem List   Diagnosis Date Noted  . Atypical chest pain 05/24/2018  . 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 CLL (chronic lymphocytic leukemia) ([place]) 09/02/2016  . Shortness of breath 09/02/2016  . 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 INSERT / REPLACE / REMOVE PACEMAKER     Medtr[employer]        Home Medications    Prior to Admission medications   Medication Sig Start Date End Date Taking? Authorizing Provider  finasteride (PROSCAR) 5 MG tablet Take 5 mg by mouth daily. 11/28/17  Yes [provider]  isosorbide mononitrate (IMDUR) 30 MG 24 hr tablet Take 1 tablet (30 mg total) by mouth daily. 10/12/18 01/10/19  Park Liter, MD  montelukast (SINGULAIR) 10 MG tablet Take 10 mg by mouth at bedtime.    [provider]  nitroGLYCERIN (NITROSTAT) 0.4 MG SL tablet Place 1 tablet (0.4 mg total) under the tongue every 5 (five) minutes as needed. 06/19/18   Park Liter, MD  pantoprazole (PROTONIX) 40 MG tablet Take 1 tablet  by mouth daily. 09/16/17   [provider]  pravastatin (PRAVACHOL) 20 MG tablet Take 1 tablet (20 mg total) by mouth every evening. Patient not taking: Reported on 10/12/2018 05/26/18 10/12/19  Park Liter, MD  rosuvastatin (CRESTOR) 20 MG tablet Take 1 tablet (20 mg total) by mouth daily. 12/17/18 01/16/19  Gar Glance, DO  tamsulosin (FLOMAX) 0.4 MG CAPS capsule Take 0.4 mg by mouth 2 (two) times daily. 08/25/16   [provider]    Family History Family History  Problem Relation Age of Onset  . Hypertension Mother   . Hypertension Father   . Throat cancer Sister   . Colon cancer Brother     Social History Social History   Tobacco Use  . Smoking status: Never Smoker  . Smokeless tobacco: Never Used  Substance Use Topics  . Alcohol use: Not Currently  . Drug use: Never     Allergies   Tetracycline   Review of Systems Review of Systems  Constitutional: Negative for chills and fever.  HENT: Negative for ear pain and sore throat.   Eyes: Negative for pain and visual disturbance.  Respiratory: Negative for cough and shortness of breath.   Cardiovascular: Negative for chest pain and palpitations.  Gastrointestinal: Negative for abdominal pain and vomiting.  Genitourinary: Negative for dysuria and hematuria.  Musculoskeletal: Negative for arthralgias and back pain.  Skin: Negative for color change and rash.  Neurological: Positive for speech difficulty. Negative for dizziness,  tremors, seizures, syncope, facial asymmetry, weakness, light-headedness, numbness and headaches.  All other systems reviewed and are negative.    Physical Exam Updated Vital Signs  ED Triage Vitals  Enc Vitals Group     BP 12/17/18 1923 (!) 177/73     Pulse Rate 12/17/18 1923 (!) 54     Resp 12/17/18 1923 18     Temp 12/17/18 1923 98.2 F (36.8 C)     Temp Source 12/17/18 1923 Oral     SpO2 12/17/18 1923 99 %     Weight 12/17/18 1925 168 lb (76.2 kg)     Height  12/17/18 1925 5\' 8"  (1.727 m)     Head Circumference --      Peak Flow --      Pain Score 12/17/18 1925 0     Pain Loc --      Pain Edu? --      Excl. in [place]? --     Physical Exam Vitals signs and nursing note reviewed.  Constitutional:      General: He is not in acute distress.    Appearance: He is well-developed. He is not ill-appearing.  HENT:     Head: Normocephalic and atraumatic.     Nose: Nose normal.     Mouth/Throat:     Mouth: Mucous membranes are dry.  Eyes:     Extraocular Movements: Extraocular movements intact.     Conjunctiva/sclera: Conjunctivae normal.     Pupils: Pupils are equal, round, and reactive to light.  Neck:     Musculoskeletal: Normal range of motion and neck supple.  Cardiovascular:     Rate and Rhythm: Normal rate and regular rhythm.     Pulses: Normal pulses.     Heart sounds: Normal heart sounds. No murmur.  Pulmonary:     Effort: Pulmonary effort is normal. No respiratory distress.     Breath sounds: Normal breath sounds.  Abdominal:     Palpations: Abdomen is soft.     Tenderness: There is no abdominal tenderness.  Skin:    General: Skin is warm and dry.  Neurological:     General: No focal deficit present.     Mental Status: He is alert and oriented to person, place, and time.     Cranial Nerves: No cranial nerve deficit.     Sensory: No sensory deficit.     Motor: No weakness.     Coordination: Coordination normal.     Gait: Gait normal.     Comments: Normal speech, 5+/5 strength, normal sensation, no drift      ED Treatments / Results  Labs (all labs ordered are listed, but only abnormal results are displayed) Labs Reviewed  CBC WITH DIFFERENTIAL/PLATELET - Abnormal; Notable for the following components:      Result Value   WBC 50.9 (*)    RBC 3.74 (*)    Hemoglobin 11.5 (*)    HCT 36.7 (*)    Platelets 112 (*)    Neutro Abs 1.3 (*)    Lymphs Abs 47.4 (*)    Monocytes Absolute 1.9 (*)    All other components within  normal limits  COMPREHENSIVE METABOLIC PANEL - Abnormal; Notable for the following components:   Calcium 8.5 (*)    Total Protein 5.6 (*)    Albumin 3.3 (*)    All other components within normal limits  LIPASE, BLOOD  PATHOLOGIST SMEAR REVIEW  CBG MONITORING, ED    EKG EKG Interpretation  Date/Time:  Sunday December 17 2018 19:37:05 EDT Ventricular Rate:  56 PR Interval:  162 QRS Duration: 90 QT Interval:  424 QTC Calculation: 409 R Axis:   60 Text Interpretation:  Sinus bradycardia Otherwise normal ECG Confirmed by Lennice Sites [id number]) on 12/17/2018 7:43:58 PM   Radiology Dg Chest 2 View  Result Date: 12/17/2018 CLINICAL DATA:  78 year old male with slurred speech weakness and confusion. EXAM: CHEST - 2 VIEW COMPARISON:  Portable chest 03/14/2018 [facility] and earlier. FINDINGS: PA and lateral views. Stable right chest cardiac pacemaker. Stable borderline to mild cardiomegaly and mildly tortuous descending thoracic aorta. Other mediastinal contours are within normal limits. Stable lung volumes. No pneumothorax, pulmonary edema, pleural effusion or confluent pulmonary opacity. Negative visible bowel gas pattern. No acute osseous abnormality identified. IMPRESSION: No acute cardiopulmonary abnormality. Electronically Signed   By: Genevie Ann M.D.   On: 12/17/2018 20:02   Ct Head Wo Contrast  Result Date: 12/17/2018 CLINICAL DATA:  Slurred speech and weakness.  Confusion. EXAM: CT HEAD WITHOUT CONTRAST TECHNIQUE: Contiguous axial images were obtained from the base of the skull through the vertex without intravenous contrast. COMPARISON:  Oct 25, 2014 FINDINGS: Brain: No subdural, epidural, or subarachnoid hemorrhage. Ventricles and sulci are unremarkable. White matter changes are identified moderate. No acute cortical ischemia or infarct. No mass effect or midline shift. Cerebellum, brainstem, and basal cisterns are normal. Vascular: Calcified atherosclerosis is seen in the intracranial  carotids. Skull: Normal. Negative for fracture or focal lesion. Sinuses/Orbits: No acute finding. Other: None. IMPRESSION: Chronic white matter changes.  No acute intracranial abnormalities. Electronically Signed   By: Dorise Bullion III M.D   On: 12/17/2018 20:05    Procedures Procedures (including critical care time)  Medications Ordered in ED Medications - No data to display   Initial Impression / Assessment and Plan / ED Course  I have reviewed the triage vital signs and the nursing notes.  Pertinent labs & imaging results that were available during my care of the patient were reviewed by me and considered in my medical decision making (see chart for details).     Michael Aguirre is a 78 year old male with history of CLL who presents to the ED with neuro symptoms that occurred yesterday.  Patient normal vitals.  No fever.  According to patient's wife he had word finding issues that lasted several minutes about 36 hours ago yesterday.  Wife today started to worry that he had a mini stroke.  Patient is reluctant to come to the ED as well.  Has a history of CLL which he is not being treated for.  White count today is 50.  He states his baseline is between 73 and 60.  Patient is neurologically intact on exam now.  No history of strokes.  Patient used to be on aspirin and cholesterol medicine in the past but no longer takes them.  Lab work otherwise unremarkable.  CT scan of his head is unremarkable.  Talked with neurology on the phone, Dr. Lorraine Lax who recommends admission for TIA work-up.  Patient however does not want a stay for this work-up.  He states that he is not able to get MRIs secondary to pacemaker and metal in his arm.  He understands the risks and benefits of staying versus leaving.  He prefers to follow-up outpatient.  He has capacity to make this decision.  Neurology recommended starting the patient on aspirin and statin.  Patient was given information to follow-up with neurology  outpatient.  Otherwise  no other concerns today.  Suspect patient likely had mini stroke.  He understands return precautions and discharged from ED in good condition.  This chart was dictated using voice recognition software.  Despite best efforts to proofread,  errors can occur which can change the documentation meaning.    Final Clinical Impressions(s) / ED Diagnoses   Final diagnoses:  Slurred speech    ED Discharge Orders         Ordered    rosuvastatin (CRESTOR) 20 MG tablet  Daily     12/17/18 2239           Lennice Sites, DO 12/17/18 2347

## 2018-12-17 NOTE — Discharge Instructions (Signed)
Take 324 mg aspirin daily

## 2018-12-19 LAB — PATHOLOGIST SMEAR REVIEW

## 2019-01-23 ENCOUNTER — Encounter: Payer: Self-pay | Admitting: Cardiology

## 2019-01-23 ENCOUNTER — Telehealth (INDEPENDENT_AMBULATORY_CARE_PROVIDER_SITE_OTHER): Payer: Medicare Other | Admitting: Cardiology

## 2019-01-23 ENCOUNTER — Other Ambulatory Visit: Payer: Self-pay

## 2019-01-23 DIAGNOSIS — G459 Transient cerebral ischemic attack, unspecified: Secondary | ICD-10-CM | POA: Insufficient documentation

## 2019-01-23 DIAGNOSIS — I442 Atrioventricular block, complete: Secondary | ICD-10-CM

## 2019-01-23 DIAGNOSIS — C9110 Chronic lymphocytic leukemia of B-cell type not having achieved remission: Secondary | ICD-10-CM

## 2019-01-23 DIAGNOSIS — R0602 Shortness of breath: Secondary | ICD-10-CM

## 2019-01-23 DIAGNOSIS — Z950 Presence of cardiac pacemaker: Secondary | ICD-10-CM

## 2019-01-23 DIAGNOSIS — R0789 Other chest pain: Secondary | ICD-10-CM

## 2019-01-23 HISTORY — DX: Transient cerebral ischemic attack, unspecified: G45.9

## 2019-01-23 MED ORDER — NITROGLYCERIN 0.4 MG SL SUBL: 0.4000 mg | SUBLINGUAL_TABLET | SUBLINGUAL | 5 refills | Status: DC | PRN

## 2019-01-23 NOTE — Addendum Note (Signed)
Addended by: Polly Cobia A on: 01/23/2019 04:45 PM   Modules accepted: Orders

## 2019-01-23 NOTE — Progress Notes (Signed)
Virtual Visit via Video Note   This visit type was conducted due to national recommendations for restrictions regarding the COVID-19 Pandemic (e.g. social distancing) in an effort to limit this patient's exposure and mitigate transmission in our community.  Due to his co-morbid illnesses, this patient is at least at moderate risk for complications without adequate follow up.  This format is felt to be most appropriate for this patient at this time.  All issues noted in this document were discussed and addressed.  A limited physical exam was performed with this format.  Please refer to the patient's chart for his consent to telehealth for [facility].  Evaluation Performed:  Follow-up visit  This visit type was conducted due to national recommendations for restrictions regarding the COVID-19 Pandemic (e.g. social distancing).  This format is felt to be most appropriate for this patient at this time.  All issues noted in this document were discussed and addressed.  No physical exam was performed (except for noted visual exam findings with Video Visits).  Please refer to the patient's chart (MyChart message for video visits and phone note for telephone visits) for the patient's consent to telehealth for [facility].  Date:  01/23/2019  ID: Michael Aguirre, DOB 26-Dec-1940, MRN [id number]   Patient Location: [address] [place] [address]   Provider location:   [facility]  PCP:  [name], MD  Cardiologist:  Jenne Campus, MD     Chief Complaint: I am doing well  History of Present Illness:    Michael Aguirre is a 78 y.o. male  who presents via audio/video conferencing for a telehealth visit today.  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  He does have a tele-visit with me today.  About a month ago he ended going to the emergency room because of brief episodes of confusion and slurred speech.  Quite extensive evaluation  has been done however no MRI because of presence of pacemaker.  He was discharged home with instruction to follow-up with neurology.  However so far he did not do it.  Since that time he is doing for denies having new or neurological problem.  He also does have a chronic back problem that prevent him from walking and moving around.  No dizziness no syncope still described to have some chest pain.  Last time I gave him Imdur hoping to prevent him from having episode of chest pain however he was unable to tolerate it.  He takes nitroglycerin as needed with good results.   The patient does not have symptoms concerning for COVID-19 infection (fever, chills, cough, or new SHORTNESS OF BREATH).    Prior CV studies:   The following studies were reviewed today:  Recent stress test done in January showed no evidence of ischemia     Past Medical History:  Diagnosis Date  . CLL (chronic lymphocytic leukemia) ([place])   . Sciatic nerve pain     Past Surgical History:  Procedure Laterality Date  . INSERT / REPLACE / REMOVE PACEMAKER     [employer]     Current Meds  Medication Sig  . finasteride (PROSCAR) 5 MG tablet Take 5 mg by mouth daily.  Marland Kitchen linaCLOtide (LINZESS PO) Take by mouth.  . montelukast (SINGULAIR) 10 MG tablet Take 10 mg by mouth at bedtime.  Marland Kitchen morphine (MSIR) 15 MG tablet 1 TABLET EVERY 8 HOURS AS NEEDED FOR SEVERE CHRONIC PAIN  .  nitroGLYCERIN (NITROSTAT) 0.4 MG SL tablet Place 1 tablet (0.4 mg total) under the tongue every 5 (five) minutes as needed.  . pantoprazole (PROTONIX) 40 MG tablet Take 1 tablet by mouth daily.  . tamsulosin (FLOMAX) 0.4 MG CAPS capsule Take 0.4 mg by mouth 2 (two) times daily.      Family History: The patient's family history includes Colon cancer in his brother; Hypertension in his father and mother; Throat cancer in his sister.   ROS:   Please see the history of present illness.     All other systems reviewed and are negative.   Labs/Other  Tests and Data Reviewed:     Recent Labs: 12/17/2018: ALT 12; BUN 23; Creatinine, Ser 1.00; Hemoglobin 11.5; Platelets 112; Potassium 4.1; Sodium 142  Recent Lipid Panel    Component Value Date/Time   CHOL 217 (H) 05/24/2018 1704   TRIG 217 (H) 05/24/2018 1704   HDL 35 (L) 05/24/2018 1704   CHOLHDL 6.2 (H) 05/24/2018 1704   LDLCALC 139 (H) 05/24/2018 1704      Exam:    Vital Signs:  There were no vitals taken for this visit.    Wt Readings from Last 3 Encounters:  12/17/18 168 lb (76.2 kg)  06/14/18 185 lb (83.9 kg)  05/24/18 185 lb 6.4 oz (84.1 kg)     Well nourished, well developed in no acute distress. Alert awake and x3 talking to me via video link.  She is his wife is present during the conversation.  Diagnosis for this visit:   1. Transient complete heart block (HCC)   2. CLL (chronic lymphocytic leukemia) ([place])   3. Pacemaker   4. Atypical chest pain   5. TIA (transient ischemic attack)      ASSESSMENT & PLAN:    1.  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  CLL stable Atypical chest pain.  Take nitroglycerin maybe once or twice a month.  Will continue.  Unable to tolerate long-acting nitrates 3.  History of TIA.  Carotic ultrasounds was done which did not show any critical lesions.  Will interrogate his device to see if there is any evidence of atrial fibrillation recently.  Based on that we will decide about therapy. 4.  Swelling of lower extremities I will ask him to have proBNP as well as Chem-7 done white count to our office.  COVID-19 Education: The signs and symptoms of COVID-19 were discussed with the patient and how to seek care for testing (follow up with PCP or arrange E-visit).  The importance of social distancing was discussed today.  Patient Risk:   After full review of this patients clinical status, I feel that they are at least moderate risk at this time.  Time:   Today, I have spent 20 minutes  with the patient with telehealth technology discussing pt health issues.  I spent 5 minutes reviewing her chart before the visit.  Visit was finished at 420.    Medication Adjustments/Labs and Tests Ordered: Current medicines are reviewed at length with the patient today.  Concerns regarding medicines are outlined above.  No orders of the defined types were placed in this encounter.  Medication changes: No orders of the defined types were placed in this encounter.    Disposition: Follow-up in 3 months  Signed, Park Liter, MD, [facility] 01/23/2019 4:19 PM    [place]

## 2019-01-23 NOTE — Patient Instructions (Signed)
Medication Instructions:  Your physician recommends that you continue on your current medications as directed. Please refer to the Current Medication list given to you today.  If you need a refill on your cardiac medications before your next appointment, please call your pharmacy.   Lab work: Your physician recommends that you return for lab work today: pro bnp, bmp   If you have labs (blood work) drawn today and your tests are completely normal, you will receive your results only by: Marland Kitchen MyChart Message (if you have MyChart) OR . A paper copy in the mail If you have any lab test that is abnormal or we need to change your treatment, we will call you to review the results.  Testing/Procedures: None.   Follow-Up: At [facility], you and your health needs are our priority.  As part of our continuing mission to provide you with exceptional heart care, we have created designated Provider Care Teams.  These Care Teams include your primary Cardiologist (physician) and Advanced Practice Providers (APPs -  Physician Assistants and Nurse Practitioners) who all work together to provide you with the care you need, when you need it. You will need a follow up appointment in 2 months.  Please call our office 2 months in advance to schedule this appointment.  You may see No primary care provider on file. or another member of our [employer] in [place], [place]  Shirlee More, MD . Jyl Heinz, MD  Any Other Special Instructions Will Be Listed Below (If Applicable).  You will need to have you pacemaker interrogated we will arrange for this.

## 2019-01-23 NOTE — Addendum Note (Signed)
Addended by: Ashok Norris on: 01/23/2019 04:46 PM   Modules accepted: Orders

## 2019-03-27 ENCOUNTER — Encounter: Payer: Self-pay | Admitting: Cardiology

## 2019-03-27 ENCOUNTER — Other Ambulatory Visit: Payer: Self-pay

## 2019-03-27 ENCOUNTER — Ambulatory Visit (INDEPENDENT_AMBULATORY_CARE_PROVIDER_SITE_OTHER): Payer: Medicare Other | Admitting: Cardiology

## 2019-03-27 VITALS — BP 150/68 | HR 58 | Ht 68.0 in | Wt 174.2 lb

## 2019-03-27 DIAGNOSIS — R0789 Other chest pain: Secondary | ICD-10-CM | POA: Diagnosis not present

## 2019-03-27 DIAGNOSIS — C9110 Chronic lymphocytic leukemia of B-cell type not having achieved remission: Secondary | ICD-10-CM | POA: Diagnosis not present

## 2019-03-27 DIAGNOSIS — M7989 Other specified soft tissue disorders: Secondary | ICD-10-CM | POA: Insufficient documentation

## 2019-03-27 DIAGNOSIS — Z950 Presence of cardiac pacemaker: Secondary | ICD-10-CM

## 2019-03-27 HISTORY — DX: Other specified soft tissue disorders: M79.89

## 2019-03-27 NOTE — Progress Notes (Addendum)
Cardiology Office Note:    Date:  03/27/2019   ID:  Michael Aguirre, DOB Apr 13, 1941, MRN [id number]  PCP:  [name], MD  Cardiologist:  Jenne Campus, MD    Referring MD: [name], MD   Chief Complaint  Patient presents with  . Follow-up  Feeling weak and tired  History of Present Illness:    Michael Aguirre is a 78 y.o. male who came to me initially with chief complaint of weak and tired.  He is slightly getting worse on top of that he started having swelling of lower extremities as well as his hands.  Swelling does not go back down after night.  It is pitting edema.  Described to have weakness fatigue tiredness no chest pain no tightness no pressure no burning in the chest.  Sleeps fair.  Does not have proximal nocturnal dyspnea.  Past Medical History:  Diagnosis Date  . CLL (chronic lymphocytic leukemia) ([place])   . Sciatic nerve pain     Past Surgical History:  Procedure Laterality Date  . INSERT / REPLACE / REMOVE PACEMAKER     Medtronic    Current Medications: Current Meds  Medication Sig  . AMITIZA 24 MCG capsule Take 24 mcg by mouth 2 (two) times daily.  . [employer] PURELAX 17 GM/SCOOP powder USE 17GRAMS ONCE A DAY PUT IN AT LEAST 8 OZ OF WATER AND STIR WELL  . finasteride (PROSCAR) 5 MG tablet Take 5 mg by mouth daily.  Marland Kitchen HYDROcodone-acetaminophen (NORCO) 10-325 MG tablet Take 1 tablet by mouth 2 (two) times daily.  . montelukast (SINGULAIR) 10 MG tablet Take 10 mg by mouth at bedtime.  . nitroGLYCERIN (NITROSTAT) 0.4 MG SL tablet Place 1 tablet (0.4 mg total) under the tongue every 5 (five) minutes as needed.  . pantoprazole (PROTONIX) 40 MG tablet Take 1 tablet by mouth daily.  . tamsulosin (FLOMAX) 0.4 MG CAPS capsule Take 0.4 mg by mouth 2 (two) times daily.     Allergies:   Tetracycline and Isosorbide   Social History   Socioeconomic History  . Marital status: Single    Spouse name: Not on file  . Number of children: Not on file  . Years of  education: Not on file  . Highest education level: Not on file  Occupational History  . Not on file  Social Needs  . Financial resource strain: Not on file  . Food insecurity    Worry: Not on file    Inability: Not on file  . Transportation needs    Medical: Not on file    Non-medical: Not on file  Tobacco Use  . Smoking status: Never Smoker  . Smokeless tobacco: Never Used  Substance and Sexual Activity  . Alcohol use: Not Currently  . Drug use: Never  . Sexual activity: Not on file  Lifestyle  . Physical activity    Days per week: Not on file    Minutes per session: Not on file  . Stress: Not on file  Relationships  . Social Herbalist on phone: Not on file    Gets together: Not on file    Attends religious service: Not on file    Active member of club or organization: Not on file    Attends meetings of clubs or organizations: Not on file    Relationship status: Not on file  Other Topics Concern  . Not on file  Social History Narrative  . Not on file  Family History: The patient's family history includes Colon cancer in his brother; Hypertension in his father and mother; Throat cancer in his sister. ROS:   Please see the history of present illness.    All 14 point review of systems negative except as described per history of present illness  EKGs/Labs/Other Studies Reviewed:    Pacemaker has been interrogated today and this is 66 October 324.  Battery status is fine.  There is still excessive noise on his atrial channel however there is no evidence of atrial fibrillation.  Therefore, we will continue present management.  In the meantime we received blood test back under a lot of abnormality including high TSH also hypoalbuminemia.  He was referred back to his primary care physician he did see primary care physician today he is being scheduled to see dietitian as well as nephrologist and TSH has been addressed as well.  Recent Labs: 12/17/2018: ALT 12;  BUN 23; Creatinine, Ser 1.00; Hemoglobin 11.5; Platelets 112; Potassium 4.1; Sodium 142  Recent Lipid Panel    Component Value Date/Time   CHOL 217 (H) 05/24/2018 1704   TRIG 217 (H) 05/24/2018 1704   HDL 35 (L) 05/24/2018 1704   CHOLHDL 6.2 (H) 05/24/2018 1704   LDLCALC 139 (H) 05/24/2018 1704    Physical Exam:    VS:  BP (!) 150/68   Pulse (!) 58   Ht 5\' 8"  (1.727 m)   Wt 174 lb 3.2 oz (79 kg)   SpO2 96%   BMI 26.49 kg/m     Wt Readings from Last 3 Encounters:  03/27/19 174 lb 3.2 oz (79 kg)  12/17/18 168 lb (76.2 kg)  06/14/18 185 lb (83.9 kg)     GEN:  Well nourished, well developed in no acute distress HEENT: Normal NECK: No JVD; No carotid bruits LYMPHATICS: No lymphadenopathy CARDIAC: RRR, no murmurs, no rubs, no gallops RESPIRATORY:  Clear to auscultation without rales, wheezing or rhonchi  ABDOMEN: Soft, non-tender, non-distended MUSCULOSKELETAL:  No edema; No deformity  SKIN: Warm and dry LOWER EXTREMITIES: 1+ swelling NEUROLOGIC:  Alert and oriented x 3 PSYCHIATRIC:  Normal affect   ASSESSMENT:    1. Pacemaker   2. Swelling of both lower extremities   3. CLL (chronic lymphocytic leukemia) ([place])   4. Atypical chest pain    PLAN:    In order of problems listed above:  1. Pacemaker we will schedule pacemaker interrogation there was excessive noise in the atrial lead and it was switched to unipolar.  I will see if he got an episode of atrial fibrillation on top of that he will make sure the device is still functioning properly. 2. Swelling of lower extremities of course concerns about congestive heart failure however swelling is not positional.  I will ask him to have complete metabolic panel will be looking for autoimmune as well as protein.  TSH will be done as well.  As a part of evaluation he will have echocardiogram to check left ventricle ejection fraction as well as proBNP. 3. CLL poor follow-up by the medicine team. 4. Atypical chest pain denies  having any   Medication Adjustments/Labs and Tests Ordered: Current medicines are reviewed at length with the patient today.  Concerns regarding medicines are outlined above.  No orders of the defined types were placed in this encounter.  Medication changes: No orders of the defined types were placed in this encounter.   Signed, Park Liter, MD, [facility] 03/27/2019 3:27 PM    [facility]

## 2019-03-27 NOTE — Patient Instructions (Signed)
Medication Instructions:  Your physician recommends that you continue on your current medications as directed. Please refer to the Current Medication list given to you today.  *If you need a refill on your cardiac medications before your next appointment, please call your pharmacy*  Lab Work: Your physician recommends that you return for lab work today: CMP, BNP, TSH, CBC  If you have labs (blood work) drawn today and your tests are completely normal, you will receive your results only by: Marland Kitchen MyChart Message (if you have MyChart) OR . A paper copy in the mail If you have any lab test that is abnormal or we need to change your treatment, we will call you to review the results.  Testing/Procedures: Your physician has requested that you have an echocardiogram. Echocardiography is a painless test that uses sound waves to create images of your heart. It provides your doctor with information about the size and shape of your heart and how well your heart's chambers and valves are working. This procedure takes approximately one hour. There are no restrictions for this procedure.    Follow-Up: At [facility], you and your health needs are our priority.  As part of our continuing mission to provide you with exceptional heart care, we have created designated Provider Care Teams.  These Care Teams include your primary Cardiologist (physician) and Advanced Practice Providers (APPs -  Physician Assistants and Nurse Practitioners) who all work together to provide you with the care you need, when you need it.  Your next appointment:   1 month   The format for your next appointment:   In Person  Provider:   Jenne Campus, MD   You wil have you pacemaker interrogated tomorrow 03/28/2019 at 2 pm in the [place] office.   Other Instructions  Echocardiogram An echocardiogram is a procedure that uses painless sound waves (ultrasound) to produce an image of the heart. Images from an echocardiogram  can provide important information about:  Signs of coronary artery disease (CAD).  Aneurysm detection. An aneurysm is a weak or damaged part of an artery wall that bulges out from the normal force of blood pumping through the body.  Heart size and shape. Changes in the size or shape of the heart can be associated with certain conditions, including heart failure, aneurysm, and CAD.  Heart muscle function.  Heart valve function.  Signs of a past heart attack.  Fluid buildup around the heart.  Thickening of the heart muscle.  A tumor or infectious growth around the heart valves. Tell a health care provider about:  Any allergies you have.  All medicines you are taking, including vitamins, herbs, eye drops, creams, and over-the-counter medicines.  Any blood disorders you have.  Any surgeries you have had.  Any medical conditions you have.  Whether you are pregnant or may be pregnant. What are the risks? Generally, this is a safe procedure. However, problems may occur, including:  Allergic reaction to dye (contrast) that may be used during the procedure. What happens before the procedure? No specific preparation is needed. You may eat and drink normally. What happens during the procedure?   An IV tube may be inserted into one of your veins.  You may receive contrast through this tube. A contrast is an injection that improves the quality of the pictures from your heart.  A gel will be applied to your chest.  A wand-like tool (transducer) will be moved over your chest. The gel will help to transmit the sound  waves from the transducer.  The sound waves will harmlessly bounce off of your heart to allow the heart images to be captured in real-time motion. The images will be recorded on a computer. The procedure may vary among health care providers and hospitals. What happens after the procedure?  You may return to your normal, everyday life, including diet, activities, and  medicines, unless your health care provider tells you not to do that. Summary  An echocardiogram is a procedure that uses painless sound waves (ultrasound) to produce an image of the heart.  Images from an echocardiogram can provide important information about the size and shape of your heart, heart muscle function, heart valve function, and fluid buildup around your heart.  You do not need to do anything to prepare before this procedure. You may eat and drink normally.  After the echocardiogram is completed, you may return to your normal, everyday life, unless your health care provider tells you not to do that. This information is not intended to replace advice given to you by your health care provider. Make sure you discuss any questions you have with your health care provider. Document Released: 05/21/2000 Document Revised: 09/14/2018 Document Reviewed: 06/26/2016 Elsevier Patient Education  2020 [employer].

## 2019-03-28 ENCOUNTER — Telehealth: Payer: Self-pay | Admitting: Emergency Medicine

## 2019-03-28 LAB — TSH: TSH: 9.17 u[IU]/mL — ABNORMAL HIGH (ref 0.450–4.500)

## 2019-03-28 LAB — COMPREHENSIVE METABOLIC PANEL
ALT: 10 IU/L (ref 0–44)
AST: 18 IU/L (ref 0–40)
Albumin/Globulin Ratio: 2 (ref 1.2–2.2)
Albumin: 3.2 g/dL — ABNORMAL LOW (ref 3.7–4.7)
Alkaline Phosphatase: 72 IU/L (ref 39–117)
BUN/Creatinine Ratio: 14 (ref 10–24)
BUN: 16 mg/dL (ref 8–27)
Bilirubin Total: 0.3 mg/dL (ref 0.0–1.2)
CO2: 30 mmol/L — ABNORMAL HIGH (ref 20–29)
Calcium: 8.4 mg/dL — ABNORMAL LOW (ref 8.6–10.2)
Chloride: 104 mmol/L (ref 96–106)
Creatinine, Ser: 1.13 mg/dL (ref 0.76–1.27)
GFR calc Af Amer: 71 mL/min/{1.73_m2} (ref >59)
GFR calc non Af Amer: 61 mL/min/{1.73_m2} (ref >59)
Globulin, Total: 1.6 g/dL (ref 1.5–4.5)
Glucose: 101 mg/dL — ABNORMAL HIGH (ref 65–99)
Potassium: 4.6 mmol/L (ref 3.5–5.2)
Sodium: 142 mmol/L (ref 134–144)
Total Protein: 4.8 g/dL — ABNORMAL LOW (ref 6.0–8.5)

## 2019-03-28 LAB — PRO B NATRIURETIC PEPTIDE: NT-Pro BNP: 1058 pg/mL — ABNORMAL HIGH (ref 0–486)

## 2019-03-28 LAB — CBC
Hematocrit: 32.9 % — ABNORMAL LOW (ref 37.5–51.0)
Hemoglobin: 10.6 g/dL — ABNORMAL LOW (ref 13.0–17.7)
MCH: 31.2 pg (ref 26.6–33.0)
MCHC: 32.2 g/dL (ref 31.5–35.7)
MCV: 97 fL (ref 79–97)
Platelets: 130 10*3/uL — ABNORMAL LOW (ref 150–450)
RBC: 3.4 x10E6/uL — ABNORMAL LOW (ref 4.14–5.80)
RDW: 13.7 % (ref 11.6–15.4)
WBC: 45.7 10*3/uL (ref 3.4–10.8)

## 2019-03-28 NOTE — Telephone Encounter (Signed)
Called patient with lab results advised him of all results and advised him to start 20 mg of lasix daily per Dr. Agustin Cree. Patient was also advised to follow up with primary care about tsh results and albumin and protein. A copy of results were sent to his pcp. Patient verbally understood, no further questions.

## 2019-03-29 ENCOUNTER — Ambulatory Visit (INDEPENDENT_AMBULATORY_CARE_PROVIDER_SITE_OTHER): Payer: Medicare Other

## 2019-03-29 ENCOUNTER — Other Ambulatory Visit: Payer: Self-pay

## 2019-03-29 DIAGNOSIS — R0789 Other chest pain: Secondary | ICD-10-CM | POA: Diagnosis not present

## 2019-03-29 DIAGNOSIS — M7989 Other specified soft tissue disorders: Secondary | ICD-10-CM | POA: Diagnosis not present

## 2019-03-29 NOTE — Progress Notes (Signed)
Complete echocardiogram has been performed.  Jimmy Shaniqua Guillot RDCS, RVT 

## 2019-05-07 ENCOUNTER — Encounter: Payer: Self-pay | Admitting: Cardiology

## 2019-05-07 ENCOUNTER — Other Ambulatory Visit: Payer: Self-pay

## 2019-05-07 ENCOUNTER — Ambulatory Visit (INDEPENDENT_AMBULATORY_CARE_PROVIDER_SITE_OTHER): Payer: Medicare Other | Admitting: Cardiology

## 2019-05-07 VITALS — BP 144/66 | HR 67 | Ht 68.0 in | Wt 172.0 lb

## 2019-05-07 DIAGNOSIS — I442 Atrioventricular block, complete: Secondary | ICD-10-CM | POA: Diagnosis not present

## 2019-05-07 DIAGNOSIS — Z950 Presence of cardiac pacemaker: Secondary | ICD-10-CM | POA: Diagnosis not present

## 2019-05-07 DIAGNOSIS — C9110 Chronic lymphocytic leukemia of B-cell type not having achieved remission: Secondary | ICD-10-CM

## 2019-05-07 DIAGNOSIS — R0602 Shortness of breath: Secondary | ICD-10-CM

## 2019-05-07 DIAGNOSIS — M7989 Other specified soft tissue disorders: Secondary | ICD-10-CM

## 2019-05-07 NOTE — Patient Instructions (Signed)
Medication Instructions:  Your physician recommends that you continue on your current medications as directed. Please refer to the Current Medication list given to you today.  *If you need a refill on your cardiac medications before your next appointment, please call your pharmacy*  Lab Work: None.  If you have labs (blood work) drawn today and your tests are completely normal, you will receive your results only by: Marland Kitchen MyChart Message (if you have MyChart) OR . A paper copy in the mail If you have any lab test that is abnormal or we need to change your treatment, we will call you to review the results.  Testing/Procedures: None .  Follow-Up: At [facility], you and your health needs are our priority.  As part of our continuing mission to provide you with exceptional heart care, we have created designated Provider Care Teams.  These Care Teams include your primary Cardiologist (physician) and Advanced Practice Providers (APPs -  Physician Assistants and Nurse Practitioners) who all work together to provide you with the care you need, when you need it.  Your next appointment:   1 month(s)  The format for your next appointment:   In Person  Provider:   You may see Dr. Agustin Cree or the following Advanced Practice Provider on your designated Care Team:    Laurann Montana, FNP   Other Instructions

## 2019-05-07 NOTE — Progress Notes (Signed)
Cardiology Office Note:    Date:  05/07/2019   ID:  Michael Aguirre, DOB 04-09-1941, MRN [id number]  PCP:  [name], MD  Cardiologist:  Jenne Campus, MD    Referring MD: [name], MD   Chief Complaint  Patient presents with  . 1 month follow up    History of Present Illness:    Michael Aguirre is a 78 y.o. 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  We did a lot of blood test recently.  He was discovered to have hypothyroidism, thyroid medication has been adjusted, he also was found to have diastolic dysfunction I put him on small dose of diuretic 20 mg daily.  Lower extremities improved in terms of swelling but overall he still complain of being weak tired and exhausted.  I am not sure if he could not explain his symptoms based on cardiac pathology.  He is scheduled to see nephrologist within the next few days, as well as he is oncologist within next 3 weeks I think this will be very beneficial that he can be evaluated by them  Past Medical History:  Diagnosis Date  . CLL (chronic lymphocytic leukemia) ([place])   . Sciatic nerve pain     Past Surgical History:  Procedure Laterality Date  . INSERT / REPLACE / REMOVE PACEMAKER     Medtronic    Current Medications: Current Meds  Medication Sig  . AMITIZA 24 MCG capsule Take 24 mcg by mouth 2 (two) times daily.  Marland Kitchen aspirin 325 MG tablet Take 325 mg by mouth every other day.  . bethanechol (URECHOLINE) 25 MG tablet Take 25 mg by mouth 2 (two) times daily.  . finasteride (PROSCAR) 5 MG tablet Take 5 mg by mouth daily.  . furosemide (LASIX) 20 MG tablet Take 20 mg by mouth as needed.   Marland Kitchen HYDROcodone-acetaminophen (NORCO) 10-325 MG tablet Take 1 tablet by mouth 2 (two) times daily.  Marland Kitchen levothyroxine (SYNTHROID) 50 MCG tablet Take 50 mcg by mouth daily before breakfast.  .  montelukast (SINGULAIR) 10 MG tablet Take 10 mg by mouth at bedtime.  . nitroGLYCERIN (NITROSTAT) 0.4 MG SL tablet Place 1 tablet (0.4 mg total) under the tongue every 5 (five) minutes as needed.  . ondansetron (ZOFRAN) 8 MG tablet Take 8 mg by mouth as needed for nausea or vomiting.  Marland Kitchen tiZANidine (ZANAFLEX) 2 MG tablet Take 2 mg by mouth as needed for muscle spasms.     Allergies:   Tetracycline and Isosorbide   Social History   Socioeconomic History  . Marital status: Single    Spouse name: Not on file  . Number of children: Not on file  . Years of education: Not on file  . Highest education level: Not on file  Occupational History  . Not on file  Social Needs  . Financial resource strain: Not on file  . Food insecurity    Worry: Not on file    Inability: Not on file  . Transportation needs    Medical: Not on file    Non-medical: Not on file  Tobacco Use  . Smoking status: Never Smoker  . Smokeless tobacco: Never Used  Substance and Sexual Activity  . Alcohol use: Not Currently  . Drug use: Never  . Sexual activity: Not on file  Lifestyle  . Physical activity    Days  per week: Not on file    Minutes per session: Not on file  . Stress: Not on file  Relationships  . Social Herbalist on phone: Not on file    Gets together: Not on file    Attends religious service: Not on file    Active member of club or organization: Not on file    Attends meetings of clubs or organizations: Not on file    Relationship status: Not on file  Other Topics Concern  . Not on file  Social History Narrative  . Not on file     Family History: The patient's family history includes Colon cancer in his brother; Hypertension in his father and mother; Throat cancer in his sister. ROS:   Please see the history of present illness.    All 14 point review of systems negative except as described per history of present illness  EKGs/Labs/Other Studies Reviewed:      Recent Labs:  03/27/2019: ALT 10; BUN 16; Creatinine, Ser 1.13; Hemoglobin 10.6; NT-Pro BNP 1,058; Platelets 130; Potassium 4.6; Sodium 142; TSH 9.170  Recent Lipid Panel    Component Value Date/Time   CHOL 217 (H) 05/24/2018 1704   TRIG 217 (H) 05/24/2018 1704   HDL 35 (L) 05/24/2018 1704   CHOLHDL 6.2 (H) 05/24/2018 1704   LDLCALC 139 (H) 05/24/2018 1704    Physical Exam:    VS:  BP (!) 144/66   Pulse 67   Ht 5\' 8"  (1.727 m)   Wt 172 lb (78 kg)   SpO2 95%   BMI 26.15 kg/m     Wt Readings from Last 3 Encounters:  05/07/19 172 lb (78 kg)  03/27/19 174 lb 3.2 oz (79 kg)  12/17/18 168 lb (76.2 kg)     GEN:  Well nourished, well developed in no acute distress HEENT: Normal NECK: No JVD; No carotid bruits LYMPHATICS: No lymphadenopathy CARDIAC: RRR, no murmurs, no rubs, no gallops RESPIRATORY:  Clear to auscultation without rales, wheezing or rhonchi  ABDOMEN: Soft, non-tender, non-distended MUSCULOSKELETAL:  No edema; No deformity  SKIN: Warm and dry LOWER EXTREMITIES: no swelling NEUROLOGIC:  Alert and oriented x 3 PSYCHIATRIC:  Normal affect   ASSESSMENT:    1. Transient complete heart block (HCC)   2. Pacemaker   3. CLL (chronic lymphocytic leukemia) ([place])   4. Swelling of both lower extremities   5. Shortness of breath    PLAN:    In order of problems listed above:  1. Transient complete heart block.  Pacemaker in place 2. CLL scheduled to see oncology team in 3 weeks. 3. Swelling of lower extremities improved.  We will continue Lasix on as-needed basis 4. Shortness of breath with fatigue and tiredness.  Multifactorial, I hope that the improvement with thyroid function will help   Medication Adjustments/Labs and Tests Ordered: Current medicines are reviewed at length with the patient today.  Concerns regarding medicines are outlined above.  No orders of the defined types were placed in this encounter.  Medication changes: No orders of the defined types were placed in  this encounter.   Signed, Park Liter, MD, [facility] 05/07/2019 4:17 PM    [place]

## 2019-05-10 DIAGNOSIS — R600 Localized edema: Secondary | ICD-10-CM | POA: Insufficient documentation

## 2019-05-10 DIAGNOSIS — R3129 Other microscopic hematuria: Secondary | ICD-10-CM

## 2019-05-10 DIAGNOSIS — E8809 Other disorders of plasma-protein metabolism, not elsewhere classified: Secondary | ICD-10-CM

## 2019-05-10 DIAGNOSIS — N400 Enlarged prostate without lower urinary tract symptoms: Secondary | ICD-10-CM | POA: Insufficient documentation

## 2019-05-10 DIAGNOSIS — R809 Proteinuria, unspecified: Secondary | ICD-10-CM

## 2019-05-10 DIAGNOSIS — E039 Hypothyroidism, unspecified: Secondary | ICD-10-CM | POA: Insufficient documentation

## 2019-05-10 HISTORY — DX: Localized edema: R60.0

## 2019-05-10 HISTORY — DX: Other microscopic hematuria: R31.29

## 2019-05-10 HISTORY — DX: Benign prostatic hyperplasia without lower urinary tract symptoms: N40.0

## 2019-05-10 HISTORY — DX: Hypothyroidism, unspecified: E03.9

## 2019-05-10 HISTORY — DX: Other disorders of plasma-protein metabolism, not elsewhere classified: E88.09

## 2019-05-10 HISTORY — DX: Proteinuria, unspecified: R80.9

## 2019-05-24 ENCOUNTER — Other Ambulatory Visit: Payer: Self-pay | Admitting: Orthopaedic Surgery

## 2019-05-24 DIAGNOSIS — M5431 Sciatica, right side: Secondary | ICD-10-CM

## 2019-05-24 DIAGNOSIS — M5432 Sciatica, left side: Secondary | ICD-10-CM

## 2019-05-25 ENCOUNTER — Telehealth: Payer: Self-pay

## 2019-05-25 NOTE — Telephone Encounter (Signed)
Spoke with patient's wife to review medications and explain myelogram procedure.  Patient has no medications to hold.  She was informed her husband will be here two hours, will need a driver (who isn't allowed in the building at this time) and will need to be on strict bedrest for 24 hours after the procedure.  She did state he is wheelchair bound at present due to "excruciating pain," and I told her it was fine for her to escort patient into building.

## 2019-05-29 ENCOUNTER — Ambulatory Visit
Admission: RE | Admit: 2019-05-29 | Discharge: 2019-05-29 | Disposition: A | Discharge status: Home / Self Care | Payer: Medicare Other | Source: Ambulatory Visit | Attending: Orthopaedic Surgery | Admitting: Orthopaedic Surgery

## 2019-05-29 ENCOUNTER — Other Ambulatory Visit: Payer: Self-pay

## 2019-05-29 DIAGNOSIS — M5432 Sciatica, left side: Secondary | ICD-10-CM

## 2019-05-29 DIAGNOSIS — M5431 Sciatica, right side: Secondary | ICD-10-CM

## 2019-05-29 MED ORDER — DIAZEPAM 5 MG PO TABS
5.0000 mg | ORAL_TABLET | Freq: Once | ORAL | Status: AC
  Administered 2019-05-29: 12:00:00 5 mg via ORAL

## 2019-05-29 MED ORDER — IOPAMIDOL (ISOVUE-M 200) INJECTION 41%
18.0000 mL | Freq: Once | INTRAMUSCULAR | Status: AC
  Administered 2019-05-29: 18 mL via INTRATHECAL

## 2019-05-29 NOTE — Discharge Instructions (Signed)

## 2019-06-11 DIAGNOSIS — M5432 Sciatica, left side: Secondary | ICD-10-CM | POA: Diagnosis not present

## 2019-06-13 ENCOUNTER — Ambulatory Visit: Payer: Medicare Other | Admitting: Cardiology

## 2019-06-18 DIAGNOSIS — D841 Defects in the complement system: Secondary | ICD-10-CM | POA: Diagnosis not present

## 2019-06-18 DIAGNOSIS — R809 Proteinuria, unspecified: Secondary | ICD-10-CM | POA: Diagnosis not present

## 2019-06-21 DIAGNOSIS — N261 Atrophy of kidney (terminal): Secondary | ICD-10-CM

## 2019-06-21 DIAGNOSIS — R3129 Other microscopic hematuria: Secondary | ICD-10-CM | POA: Diagnosis not present

## 2019-06-21 DIAGNOSIS — D841 Defects in the complement system: Secondary | ICD-10-CM | POA: Insufficient documentation

## 2019-06-21 DIAGNOSIS — E8809 Other disorders of plasma-protein metabolism, not elsewhere classified: Secondary | ICD-10-CM | POA: Diagnosis not present

## 2019-06-21 DIAGNOSIS — R809 Proteinuria, unspecified: Secondary | ICD-10-CM | POA: Diagnosis not present

## 2019-06-21 HISTORY — DX: Defects in the complement system: D84.1

## 2019-06-21 HISTORY — DX: Atrophy of kidney (terminal): N26.1

## 2019-06-26 ENCOUNTER — Telehealth: Payer: Medicare Other | Admitting: Cardiology

## 2019-06-26 DIAGNOSIS — R809 Proteinuria, unspecified: Secondary | ICD-10-CM | POA: Diagnosis not present

## 2019-06-27 DIAGNOSIS — R3129 Other microscopic hematuria: Secondary | ICD-10-CM | POA: Diagnosis not present

## 2019-06-27 DIAGNOSIS — G894 Chronic pain syndrome: Secondary | ICD-10-CM | POA: Diagnosis not present

## 2019-06-27 DIAGNOSIS — N261 Atrophy of kidney (terminal): Secondary | ICD-10-CM | POA: Diagnosis not present

## 2019-06-27 DIAGNOSIS — M5136 Other intervertebral disc degeneration, lumbar region: Secondary | ICD-10-CM | POA: Diagnosis not present

## 2019-06-27 DIAGNOSIS — R809 Proteinuria, unspecified: Secondary | ICD-10-CM | POA: Diagnosis not present

## 2019-06-27 DIAGNOSIS — Z79899 Other long term (current) drug therapy: Secondary | ICD-10-CM | POA: Diagnosis not present

## 2019-06-27 DIAGNOSIS — Q761 Klippel-Feil syndrome: Secondary | ICD-10-CM | POA: Diagnosis not present

## 2019-06-27 DIAGNOSIS — M961 Postlaminectomy syndrome, not elsewhere classified: Secondary | ICD-10-CM | POA: Diagnosis not present

## 2019-07-02 DIAGNOSIS — M5432 Sciatica, left side: Secondary | ICD-10-CM | POA: Diagnosis not present

## 2019-07-05 DIAGNOSIS — R21 Rash and other nonspecific skin eruption: Secondary | ICD-10-CM | POA: Diagnosis not present

## 2019-07-09 DIAGNOSIS — C9110 Chronic lymphocytic leukemia of B-cell type not having achieved remission: Secondary | ICD-10-CM | POA: Diagnosis not present

## 2019-07-27 DIAGNOSIS — J449 Chronic obstructive pulmonary disease, unspecified: Secondary | ICD-10-CM | POA: Diagnosis not present

## 2019-07-30 DIAGNOSIS — R809 Proteinuria, unspecified: Secondary | ICD-10-CM | POA: Diagnosis not present

## 2019-07-30 DIAGNOSIS — R3129 Other microscopic hematuria: Secondary | ICD-10-CM | POA: Diagnosis not present

## 2019-07-30 DIAGNOSIS — N261 Atrophy of kidney (terminal): Secondary | ICD-10-CM | POA: Diagnosis not present

## 2019-07-31 DIAGNOSIS — C4442 Squamous cell carcinoma of skin of scalp and neck: Secondary | ICD-10-CM | POA: Diagnosis not present

## 2019-07-31 DIAGNOSIS — L570 Actinic keratosis: Secondary | ICD-10-CM | POA: Diagnosis not present

## 2019-07-31 DIAGNOSIS — L578 Other skin changes due to chronic exposure to nonionizing radiation: Secondary | ICD-10-CM | POA: Diagnosis not present

## 2019-08-01 DIAGNOSIS — C9110 Chronic lymphocytic leukemia of B-cell type not having achieved remission: Secondary | ICD-10-CM | POA: Diagnosis not present

## 2019-08-01 DIAGNOSIS — D841 Defects in the complement system: Secondary | ICD-10-CM | POA: Diagnosis not present

## 2019-08-01 DIAGNOSIS — N261 Atrophy of kidney (terminal): Secondary | ICD-10-CM | POA: Diagnosis not present

## 2019-08-01 DIAGNOSIS — R600 Localized edema: Secondary | ICD-10-CM | POA: Diagnosis not present

## 2019-08-01 DIAGNOSIS — R3129 Other microscopic hematuria: Secondary | ICD-10-CM | POA: Diagnosis not present

## 2019-08-06 DIAGNOSIS — I10 Essential (primary) hypertension: Secondary | ICD-10-CM | POA: Diagnosis not present

## 2019-08-06 DIAGNOSIS — J309 Allergic rhinitis, unspecified: Secondary | ICD-10-CM | POA: Diagnosis not present

## 2019-08-09 DIAGNOSIS — M5432 Sciatica, left side: Secondary | ICD-10-CM | POA: Diagnosis not present

## 2019-08-09 DIAGNOSIS — I10 Essential (primary) hypertension: Secondary | ICD-10-CM | POA: Diagnosis not present

## 2019-08-12 DIAGNOSIS — R509 Fever, unspecified: Secondary | ICD-10-CM | POA: Diagnosis not present

## 2019-08-12 DIAGNOSIS — R519 Headache, unspecified: Secondary | ICD-10-CM | POA: Diagnosis not present

## 2019-08-12 DIAGNOSIS — R070 Pain in throat: Secondary | ICD-10-CM | POA: Diagnosis not present

## 2019-08-12 DIAGNOSIS — J029 Acute pharyngitis, unspecified: Secondary | ICD-10-CM | POA: Diagnosis not present

## 2019-08-24 DIAGNOSIS — J449 Chronic obstructive pulmonary disease, unspecified: Secondary | ICD-10-CM | POA: Diagnosis not present

## 2019-08-29 DIAGNOSIS — E8809 Other disorders of plasma-protein metabolism, not elsewhere classified: Secondary | ICD-10-CM | POA: Diagnosis not present

## 2019-08-29 DIAGNOSIS — I10 Essential (primary) hypertension: Secondary | ICD-10-CM

## 2019-08-29 DIAGNOSIS — D841 Defects in the complement system: Secondary | ICD-10-CM | POA: Diagnosis not present

## 2019-08-29 DIAGNOSIS — R809 Proteinuria, unspecified: Secondary | ICD-10-CM | POA: Diagnosis not present

## 2019-08-29 DIAGNOSIS — C9110 Chronic lymphocytic leukemia of B-cell type not having achieved remission: Secondary | ICD-10-CM | POA: Diagnosis not present

## 2019-08-29 HISTORY — DX: Essential (primary) hypertension: I10

## 2019-09-09 NOTE — Progress Notes (Signed)
Electrophysiology Office Note   Date:  09/10/2019   ID:  Michael Aguirre, Michael Aguirre 28-Nov-1940, MRN [id number]  PCP:  [name], MD  Cardiologist:  Agustin Cree Primary Electrophysiologist:  Danille Oppedisano Meredith Leeds, MD    No chief complaint on file.    History of Present Illness: Michael Aguirre is a 79 y.o. male who is being seen today for the evaluation of pacemaker at the request of [facility]. Presenting today for electrophysiology evaluation.  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    Today, denies symptoms of palpitations, chest pain, shortness of breath, orthopnea, PND, lower extremity edema, claudication, dizziness, presyncope, syncope, bleeding, or neurologic sequela. The patient is tolerating medications without difficulties.  Overall he is doing well.  He has no chest pain or shortness of breath.  Is able to do all his daily activities without restriction.  Past Medical History:  Diagnosis Date  . CLL (chronic lymphocytic leukemia) ([place])   . Sciatic nerve pain    Past Surgical History:  Procedure Laterality Date  . INSERT / REPLACE / REMOVE PACEMAKER     Medtronic     Current Outpatient Medications  Medication Sig Dispense Refill  . AMITIZA 24 MCG capsule Take 24 mcg by mouth 2 (two) times daily.    Marland Kitchen amLODipine (NORVASC) 2.5 MG tablet Take 5 mg by mouth daily.    . finasteride (PROSCAR) 5 MG tablet Take 5 mg by mouth daily.  3  . furosemide (LASIX) 20 MG tablet Take 20 mg by mouth as needed.     . hydrochlorothiazide (MICROZIDE) 12.5 MG capsule Take 12.5 mg by mouth daily.    Marland Kitchen levothyroxine (SYNTHROID) 50 MCG tablet Take 50 mcg by mouth daily before breakfast.    . montelukast (SINGULAIR) 10 MG tablet Take 10 mg by mouth at bedtime.    . nitroGLYCERIN (NITROSTAT) 0.4 MG SL tablet Place 1 tablet (0.4 mg total) under the tongue every 5 (five) minutes as needed. 25 tablet 5  . ondansetron  (ZOFRAN) 8 MG tablet Take 8 mg by mouth as needed for nausea or vomiting.    . pregabalin (LYRICA) 75 MG capsule Take 75 mg by mouth daily.     No current facility-administered medications for this visit.    Allergies:   Isosorbide and Tetracycline   Social History:  The patient  reports that he has never smoked. He has never used smokeless tobacco. He reports previous alcohol use. He reports that he does not use drugs.   Family History:  The patient's family history includes Colon cancer in his brother; Hypertension in his father and mother; Throat cancer in his sister.    ROS:  Please see the history of present illness.   Otherwise, review of systems is positive for none.   All other systems are reviewed and negative.   PHYSICAL EXAM: VS:  BP (!) 150/88   Pulse (!) 53   Ht 5\' 8"  (1.727 m)   Wt 167 lb 12.8 oz (76.1 kg)   SpO2 98%   BMI 25.51 kg/m  , BMI Body mass index is 25.51 kg/m. GEN: Well nourished, well developed, in no acute distress  HEENT: normal  Neck: no JVD, carotid bruits, or masses Cardiac: RRR; no murmurs, rubs, or gallops,no edema  Respiratory:  clear to auscultation bilaterally, normal work of breathing GI: soft, nontender, nondistended, + BS MS: no deformity or atrophy  Skin: warm and dry, device  site well healed Neuro:  Strength and sensation are intact Psych: euthymic mood, full affect  EKG:  EKG is ordered today. Personal review of the ekg ordered shows atrial paced, rate 50  Personal review of the device interrogation today. Results in [place]: 03/27/2019: ALT 10; BUN 16; Creatinine, Ser 1.13; Hemoglobin 10.6; NT-Pro BNP 1,058; Platelets 130; Potassium 4.6; Sodium 142; TSH 9.170    Lipid Panel     Component Value Date/Time   CHOL 217 (H) 05/24/2018 1704   TRIG 217 (H) 05/24/2018 1704   HDL 35 (L) 05/24/2018 1704   CHOLHDL 6.2 (H) 05/24/2018 1704   LDLCALC 139 (H) 05/24/2018 1704     Wt Readings from Last 3 Encounters:   09/10/19 167 lb 12.8 oz (76.1 kg)  05/07/19 172 lb (78 kg)  03/27/19 174 lb 3.2 oz (79 kg)      Other studies Reviewed: Additional studies/ records that were reviewed today include: TTE 03/29/19 1. Left ventricular ejection fraction, by visual estimation, is 60 to  65%. The left ventricle has normal function. Normal left ventricular size.  Left ventricular septal wall thickness was mildly increased. Mildly  increased left ventricular posterior  wall thickness. There is mildly increased left ventricular hypertrophy.  2. Left ventricular diastolic Doppler parameters are consistent with  pseudonormalization pattern of LV diastolic filling.  3. Global right ventricle has normal systolic function.The right  ventricular size is normal. No increase in right ventricular wall  thickness. Pacer wire seen in the right ventricle.  4. Left atrial size was mildly dilated.  5. Right atrial size was normal.  6. The mitral valve is normal in structure. Mild mitral valve  regurgitation. No evidence of mitral stenosis.  7. The tricuspid valve is normal in structure. Tricuspid valve  regurgitation is trivial.  8. Mild aortic valve annular calcification.  9. The aortic valve is normal in structure. Aortic valve regurgitation is  trivial by color flow Doppler. Structurally normal aortic valve, with no  evidence of sclerosis or stenosis.  10. The pulmonic valve was normal in structure. Pulmonic valve  regurgitation is not visualized by color flow Doppler.  11. Mildly elevated pulmonary artery systolic pressure.    ASSESSMENT AND PLAN:  1.  Transient complete heart block: Status post Medtronic dual-chamber pacemaker.  Device functioning appropriately.  He has chronic issues with both his atrial and ventricular leads.  We [name] continue to monitor.  2.  Hypertension: Blood pressure significantly elevated today.  [name] increase his Norvasc to 10 mg.  Current medicines are reviewed at length with  the patient today.   The patient does not have concerns regarding his medicines.  The following changes were made today: Increase Norvasc  Labs/ tests ordered today include:  Orders Placed This Encounter  Procedures  . EKG 12-Lead     Disposition:   FU with Charlina Dwight 1 year  Signed, Valene Villa Meredith Leeds, MD  09/10/2019 12:27 PM     [facility] [address] [place] [address] [phone] (office) [phone] (fax)

## 2019-09-10 ENCOUNTER — Ambulatory Visit (INDEPENDENT_AMBULATORY_CARE_PROVIDER_SITE_OTHER): Payer: Medicare Other | Admitting: Cardiology

## 2019-09-10 ENCOUNTER — Other Ambulatory Visit: Payer: Self-pay

## 2019-09-10 ENCOUNTER — Encounter: Payer: Self-pay | Admitting: Cardiology

## 2019-09-10 VITALS — BP 150/88 | HR 53 | Ht 68.0 in | Wt 167.8 lb

## 2019-09-10 DIAGNOSIS — Z950 Presence of cardiac pacemaker: Secondary | ICD-10-CM

## 2019-09-10 DIAGNOSIS — I442 Atrioventricular block, complete: Secondary | ICD-10-CM | POA: Diagnosis not present

## 2019-09-10 LAB — CUP PACEART INCLINIC DEVICE CHECK
Battery Impedance: 590 Ohm
Battery Remaining Longevity: 104 mo
Battery Voltage: 2.79 V
Brady Statistic AP VP Percent: 2 %
Brady Statistic AP VS Percent: 3 %
Brady Statistic AS VP Percent: 1 %
Brady Statistic AS VS Percent: 95 %
Date Time Interrogation Session: 20210405170446
Implantable Lead Implant Date: 20020322
Implantable Lead Implant Date: 20030322
Implantable Lead Location: 753859
Implantable Lead Location: 753860
Implantable Lead Model: 5092
Implantable Lead Model: 5592
Implantable Pulse Generator Implant Date: 20110526
Lead Channel Impedance Value: 362 Ohm
Lead Channel Impedance Value: 378 Ohm
Lead Channel Pacing Threshold Amplitude: 0.75 V
Lead Channel Pacing Threshold Amplitude: 1 V
Lead Channel Pacing Threshold Pulse Width: 0.4 ms
Lead Channel Pacing Threshold Pulse Width: 0.4 ms
Lead Channel Sensing Intrinsic Amplitude: 1 mV
Lead Channel Sensing Intrinsic Amplitude: 4 mV
Lead Channel Setting Pacing Amplitude: 2 V
Lead Channel Setting Pacing Amplitude: 2 V
Lead Channel Setting Pacing Pulse Width: 0.4 ms
Lead Channel Setting Sensing Sensitivity: 2 mV

## 2019-09-10 MED ORDER — AMLODIPINE BESYLATE 10 MG PO TABS: 10.0000 mg | ORAL_TABLET | Freq: Every day | ORAL | 6 refills | Status: AC

## 2019-09-10 NOTE — Patient Instructions (Addendum)
Medication Instructions:  Your physician has recommended you make the following change in your medication: 1. INCREASE Amlodipine (Norvasc) to 10 mg once daily  *If you need a refill on your cardiac medications before your next appointment, please call your pharmacy*   Lab Work: None ordered If you have labs (blood work) drawn today and your tests are completely normal, you will receive your results only by: Marland Kitchen MyChart Message (if you have MyChart) OR . A paper copy in the mail If you have any lab test that is abnormal or we need to change your treatment, we will call you to review the results.   Testing/Procedures: None ordered   Follow-Up: Remote monitoring is used to monitor your Pacemaker of ICD from home. This monitoring reduces the number of office visits required to check your device to one time per year. It allows [place] to keep an eye on the functioning of your device to ensure it is working properly. You are scheduled for a device check from home on 12/11/2019. You may send your transmission at any time that day. If you have a wireless device, the transmission will be sent automatically. After your physician reviews your transmission, you will receive a postcard with your next transmission date.   At [facility], you and your health needs are our priority.  As part of our continuing mission to provide you with exceptional heart care, we have created designated Provider Care Teams.  These Care Teams include your primary Cardiologist (physician) and Advanced Practice Providers (APPs -  Physician Assistants and Nurse Practitioners) who all work together to provide you with the care you need, when you need it.  We recommend signing up for the patient portal called "MyChart".  Sign up information is provided on this After Visit Summary.  MyChart is used to connect with patients for Virtual Visits (Telemedicine).  Patients are able to view lab/test results, encounter notes, upcoming  appointments, etc.  Non-urgent messages can be sent to your provider as well.   To learn more about what you can do with MyChart, go to [email or web address].    Your next appointment:   1 year(s)  The format for your next appointment:   In Person  Provider:   Allegra Lai, MD   Thank you for choosing [facility]!!   Trinidad Curet, RN [phone]    Other Instructions

## 2019-09-20 DIAGNOSIS — M5432 Sciatica, left side: Secondary | ICD-10-CM | POA: Diagnosis not present

## 2019-09-24 DIAGNOSIS — J449 Chronic obstructive pulmonary disease, unspecified: Secondary | ICD-10-CM | POA: Diagnosis not present

## 2019-10-01 DIAGNOSIS — N261 Atrophy of kidney (terminal): Secondary | ICD-10-CM | POA: Diagnosis not present

## 2019-10-03 DIAGNOSIS — I10 Essential (primary) hypertension: Secondary | ICD-10-CM | POA: Diagnosis not present

## 2019-10-03 DIAGNOSIS — R3129 Other microscopic hematuria: Secondary | ICD-10-CM | POA: Diagnosis not present

## 2019-10-03 DIAGNOSIS — R600 Localized edema: Secondary | ICD-10-CM | POA: Diagnosis not present

## 2019-10-03 DIAGNOSIS — R809 Proteinuria, unspecified: Secondary | ICD-10-CM | POA: Diagnosis not present

## 2019-10-03 DIAGNOSIS — N261 Atrophy of kidney (terminal): Secondary | ICD-10-CM | POA: Diagnosis not present

## 2019-10-04 DIAGNOSIS — R350 Frequency of micturition: Secondary | ICD-10-CM | POA: Diagnosis not present

## 2019-10-09 DIAGNOSIS — D044 Carcinoma in situ of skin of scalp and neck: Secondary | ICD-10-CM | POA: Diagnosis not present

## 2019-10-09 DIAGNOSIS — C4442 Squamous cell carcinoma of skin of scalp and neck: Secondary | ICD-10-CM | POA: Diagnosis not present

## 2019-10-10 IMAGING — CT CT HEAD WITHOUT CONTRAST
3 series · 16 of 46 positions shown, 19 images · non-contrast
Comparison: October 25, 2014

CLINICAL DATA: Slurred speech and weakness.  Confusion.

EXAM:
CT HEAD WITHOUT CONTRAST
TECHNIQUE: Contiguous axial images were obtained from the base of the skull
through the vertex without intravenous contrast.

[Series 2: head wo · axial · 0.39mm/px · z∈[-140,-20]mm · 10 of 29 slices shown, 13 images]
[im 3/29  brain]
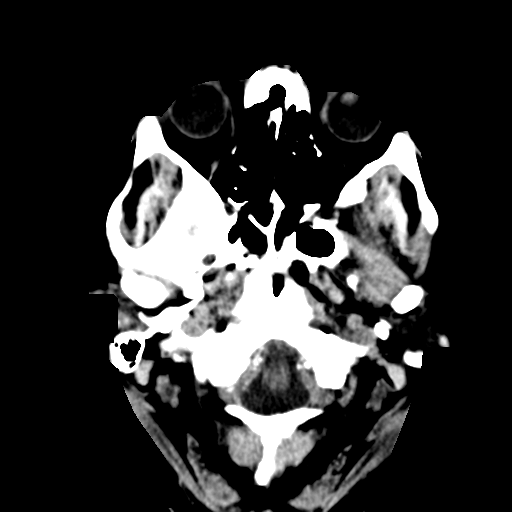
[im 3/29  bone]
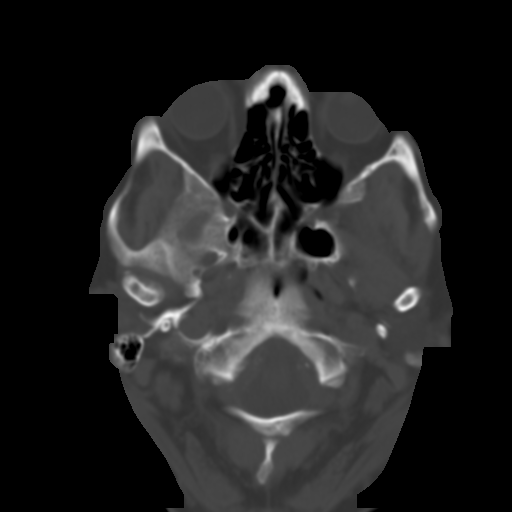
[im 6/29  brain]
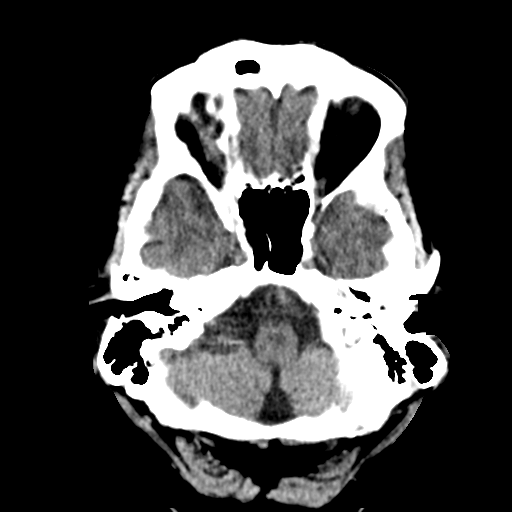
[im 8/29  brain]
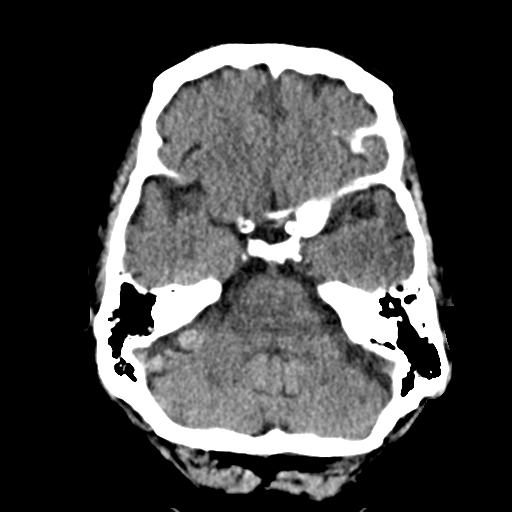
[im 11/29  brain]
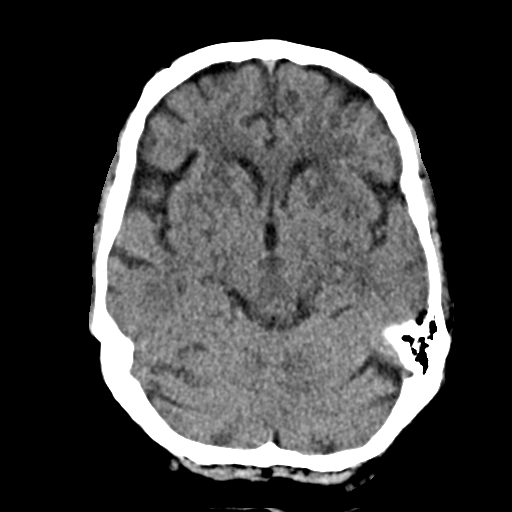
[im 14/29  brain]
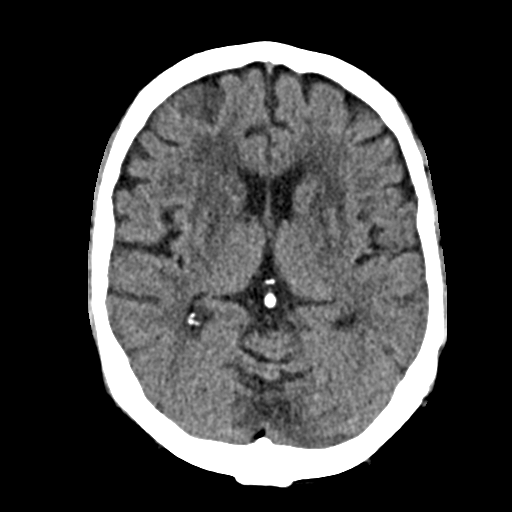
[im 14/29  bone]
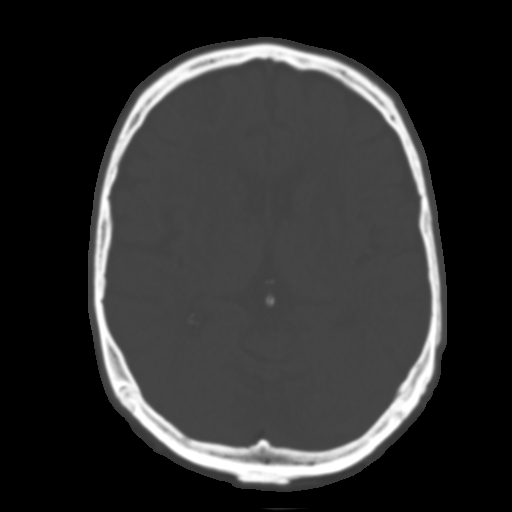
[im 16/29  brain]
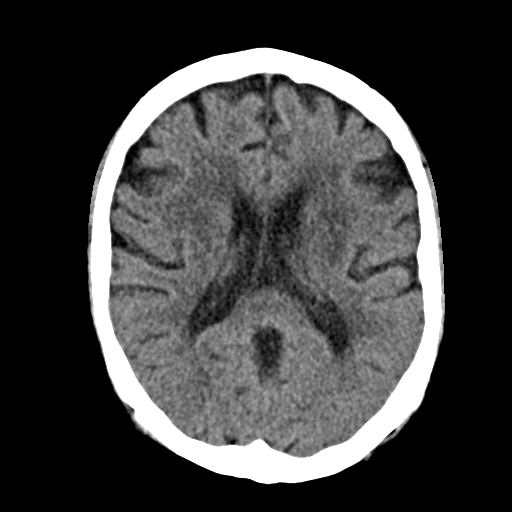
[im 19/29  brain]
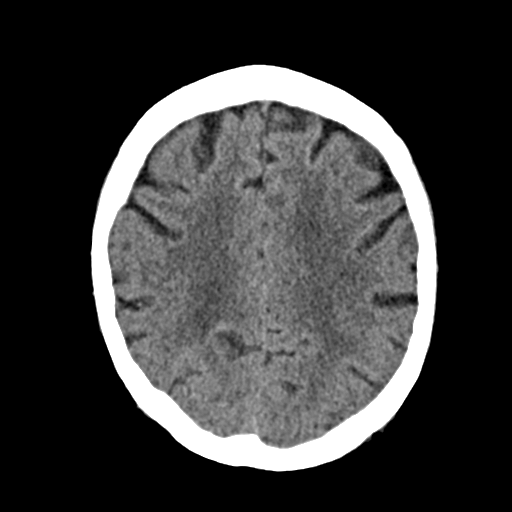
[im 22/29  brain]
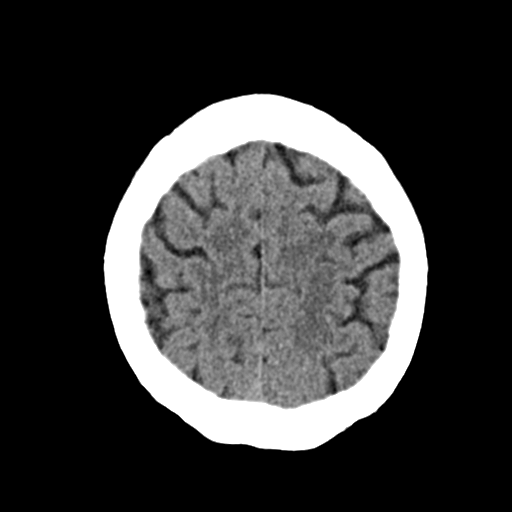
[im 24/29  brain]
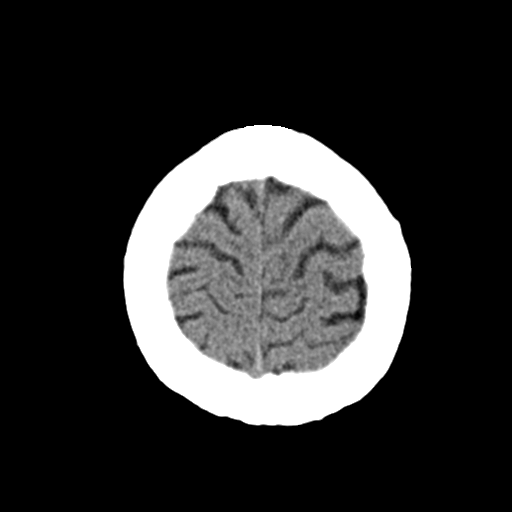
[im 24/29  bone]
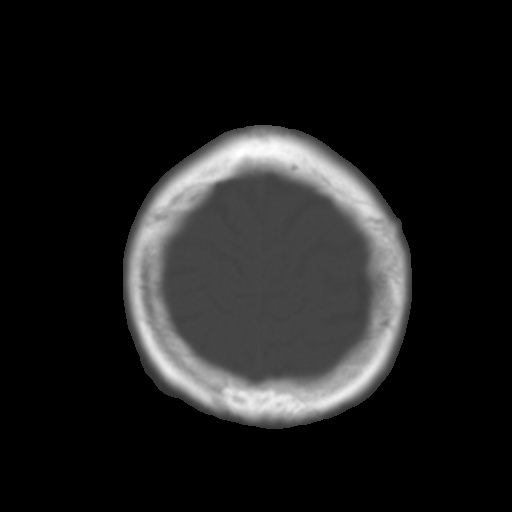
[im 27/29  brain]
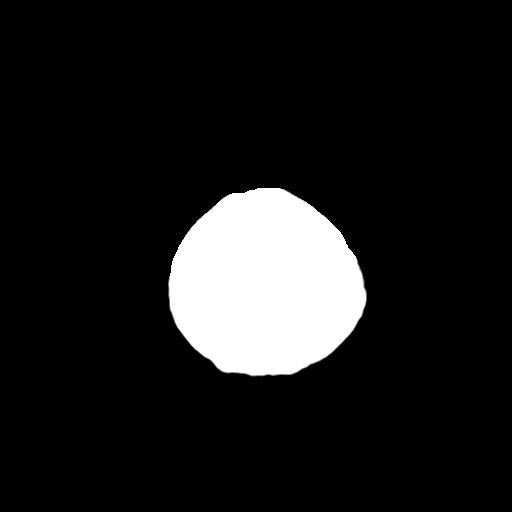

[Series 4: coronal soft · coronal · 0.32mm/px · 3 of 67 slices shown]
[im 23/67  brain]
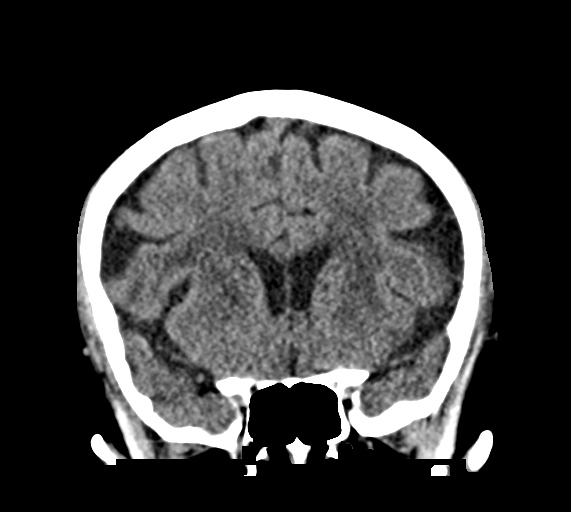
[im 30/67  brain]
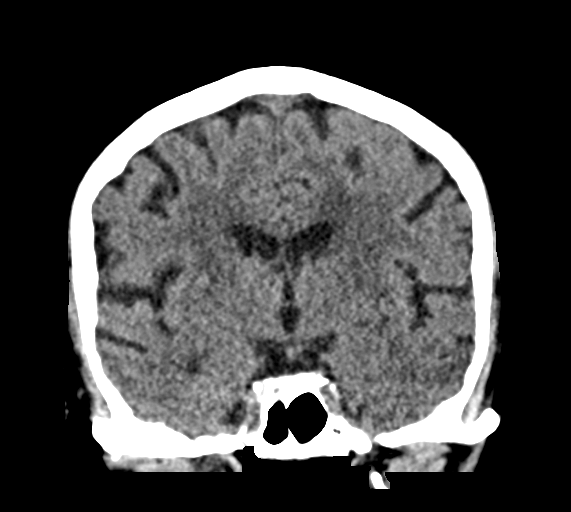
[im 37/67  brain]
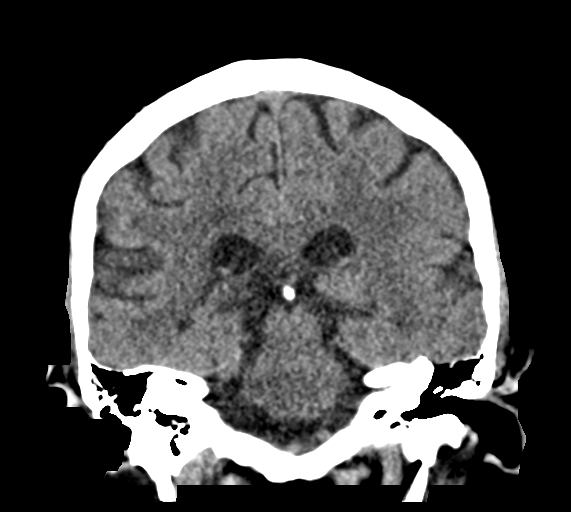

[Series 5: sag soft · sagittal · 0.32mm/px · 3 of 55 slices shown]
[im 19/55  brain]
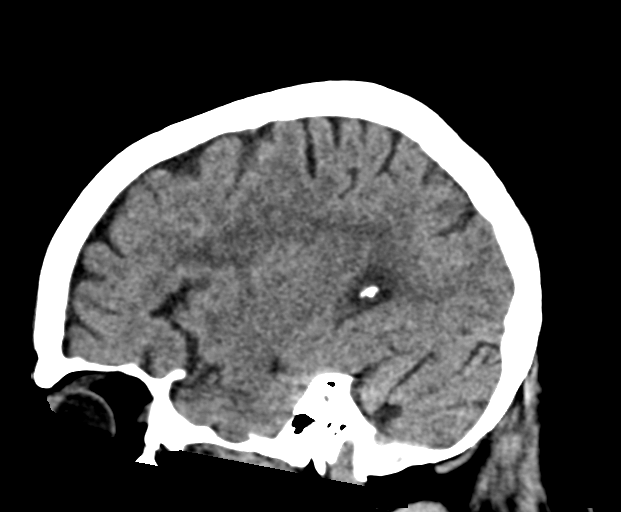
[im 28/55  brain]
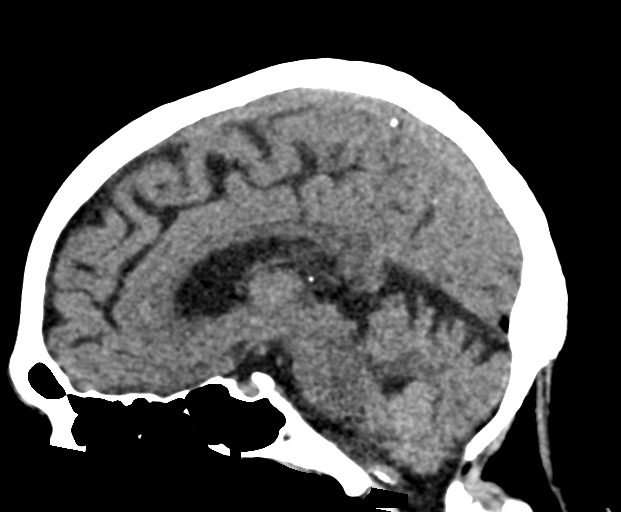
[im 37/55  brain]
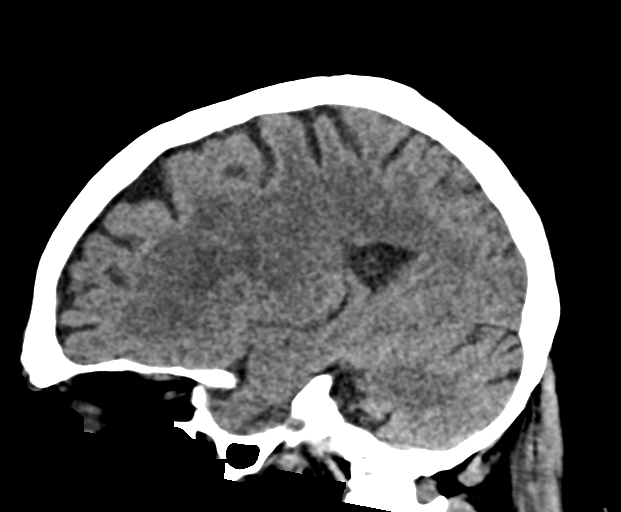

[16 of 46 positions shown; findings below may reference images not displayed]

FINDINGS: Brain: No subdural, epidural, or subarachnoid hemorrhage. Ventricles
and sulci are unremarkable. White matter changes are identified
moderate. No acute cortical ischemia or infarct. No mass effect or
midline shift. Cerebellum, brainstem, and basal cisterns are normal.

Vascular: Calcified atherosclerosis is seen in the intracranial
carotids.

Skull: Normal. Negative for fracture or focal lesion.

Sinuses/Orbits: No acute finding.

Other: None.
IMPRESSION: Chronic white matter changes.  No acute intracranial abnormalities.

## 2019-10-14 DIAGNOSIS — R309 Painful micturition, unspecified: Secondary | ICD-10-CM | POA: Diagnosis not present

## 2019-10-14 DIAGNOSIS — N3001 Acute cystitis with hematuria: Secondary | ICD-10-CM | POA: Diagnosis not present

## 2019-10-14 DIAGNOSIS — R319 Hematuria, unspecified: Secondary | ICD-10-CM | POA: Diagnosis not present

## 2019-10-14 DIAGNOSIS — L03113 Cellulitis of right upper limb: Secondary | ICD-10-CM | POA: Diagnosis not present

## 2019-10-16 DIAGNOSIS — L03113 Cellulitis of right upper limb: Secondary | ICD-10-CM | POA: Diagnosis not present

## 2019-10-22 DIAGNOSIS — R319 Hematuria, unspecified: Secondary | ICD-10-CM | POA: Diagnosis not present

## 2019-10-22 DIAGNOSIS — R300 Dysuria: Secondary | ICD-10-CM | POA: Diagnosis not present

## 2019-10-22 DIAGNOSIS — N3001 Acute cystitis with hematuria: Secondary | ICD-10-CM | POA: Diagnosis not present

## 2019-10-24 DIAGNOSIS — J449 Chronic obstructive pulmonary disease, unspecified: Secondary | ICD-10-CM | POA: Diagnosis not present

## 2019-11-07 DIAGNOSIS — S40011A Contusion of right shoulder, initial encounter: Secondary | ICD-10-CM | POA: Diagnosis not present

## 2019-11-12 DIAGNOSIS — C9110 Chronic lymphocytic leukemia of B-cell type not having achieved remission: Secondary | ICD-10-CM | POA: Diagnosis not present

## 2019-11-12 DIAGNOSIS — C9210 Chronic myeloid leukemia, BCR/ABL-positive, not having achieved remission: Secondary | ICD-10-CM | POA: Diagnosis not present

## 2019-11-24 DIAGNOSIS — J449 Chronic obstructive pulmonary disease, unspecified: Secondary | ICD-10-CM | POA: Diagnosis not present

## 2019-12-03 DIAGNOSIS — R809 Proteinuria, unspecified: Secondary | ICD-10-CM | POA: Diagnosis not present

## 2019-12-03 DIAGNOSIS — I10 Essential (primary) hypertension: Secondary | ICD-10-CM | POA: Diagnosis not present

## 2019-12-03 DIAGNOSIS — L03818 Cellulitis of other sites: Secondary | ICD-10-CM | POA: Diagnosis not present

## 2019-12-03 DIAGNOSIS — M79601 Pain in right arm: Secondary | ICD-10-CM | POA: Diagnosis not present

## 2019-12-03 DIAGNOSIS — M5432 Sciatica, left side: Secondary | ICD-10-CM | POA: Diagnosis not present

## 2019-12-05 DIAGNOSIS — I5030 Unspecified diastolic (congestive) heart failure: Secondary | ICD-10-CM | POA: Diagnosis not present

## 2019-12-05 DIAGNOSIS — R809 Proteinuria, unspecified: Secondary | ICD-10-CM | POA: Diagnosis not present

## 2019-12-05 DIAGNOSIS — R3129 Other microscopic hematuria: Secondary | ICD-10-CM | POA: Diagnosis not present

## 2019-12-05 DIAGNOSIS — N1831 Chronic kidney disease, stage 3a: Secondary | ICD-10-CM

## 2019-12-05 DIAGNOSIS — R600 Localized edema: Secondary | ICD-10-CM | POA: Diagnosis not present

## 2019-12-05 DIAGNOSIS — I130 Hypertensive heart and chronic kidney disease with heart failure and stage 1 through stage 4 chronic kidney disease, or unspecified chronic kidney disease: Secondary | ICD-10-CM | POA: Diagnosis not present

## 2019-12-05 HISTORY — DX: Chronic kidney disease, stage 3a: N18.31

## 2019-12-08 DIAGNOSIS — R21 Rash and other nonspecific skin eruption: Secondary | ICD-10-CM | POA: Diagnosis not present

## 2019-12-11 ENCOUNTER — Ambulatory Visit (INDEPENDENT_AMBULATORY_CARE_PROVIDER_SITE_OTHER): Payer: Medicare Other | Admitting: *Deleted

## 2019-12-11 DIAGNOSIS — I442 Atrioventricular block, complete: Secondary | ICD-10-CM | POA: Diagnosis not present

## 2019-12-12 LAB — CUP PACEART REMOTE DEVICE CHECK
Battery Impedance: 615 Ohm
Battery Remaining Longevity: 102 mo
Battery Voltage: 2.79 V
Brady Statistic AP VP Percent: 3 %
Brady Statistic AP VS Percent: 10 %
Brady Statistic AS VP Percent: 1 %
Brady Statistic AS VS Percent: 87 %
Date Time Interrogation Session: 20210707133334
Implantable Lead Implant Date: 20020322
Implantable Lead Implant Date: 20030322
Implantable Lead Location: 753859
Implantable Lead Location: 753860
Implantable Lead Model: 5092
Implantable Lead Model: 5592
Implantable Pulse Generator Implant Date: 20110526
Lead Channel Impedance Value: 378 Ohm
Lead Channel Impedance Value: 406 Ohm
Lead Channel Pacing Threshold Amplitude: 0.625 V
Lead Channel Pacing Threshold Amplitude: 0.875 V
Lead Channel Pacing Threshold Pulse Width: 0.4 ms
Lead Channel Pacing Threshold Pulse Width: 0.4 ms
Lead Channel Setting Pacing Amplitude: 1.75 V
Lead Channel Setting Pacing Amplitude: 2 V
Lead Channel Setting Pacing Pulse Width: 0.4 ms
Lead Channel Setting Sensing Sensitivity: 2 mV

## 2019-12-13 NOTE — Progress Notes (Signed)
Remote pacemaker transmission.   

## 2019-12-18 DIAGNOSIS — T148XXA Other injury of unspecified body region, initial encounter: Secondary | ICD-10-CM | POA: Diagnosis not present

## 2019-12-18 DIAGNOSIS — Z9181 History of falling: Secondary | ICD-10-CM | POA: Diagnosis not present

## 2019-12-18 DIAGNOSIS — G8929 Other chronic pain: Secondary | ICD-10-CM | POA: Diagnosis not present

## 2019-12-18 DIAGNOSIS — M25542 Pain in joints of left hand: Secondary | ICD-10-CM | POA: Diagnosis not present

## 2019-12-24 DIAGNOSIS — J449 Chronic obstructive pulmonary disease, unspecified: Secondary | ICD-10-CM | POA: Diagnosis not present

## 2019-12-31 DIAGNOSIS — I10 Essential (primary) hypertension: Secondary | ICD-10-CM | POA: Diagnosis not present

## 2019-12-31 DIAGNOSIS — M79641 Pain in right hand: Secondary | ICD-10-CM | POA: Diagnosis not present

## 2019-12-31 DIAGNOSIS — E039 Hypothyroidism, unspecified: Secondary | ICD-10-CM | POA: Diagnosis not present

## 2019-12-31 DIAGNOSIS — M79642 Pain in left hand: Secondary | ICD-10-CM | POA: Diagnosis not present

## 2019-12-31 DIAGNOSIS — Z79899 Other long term (current) drug therapy: Secondary | ICD-10-CM | POA: Diagnosis not present

## 2020-01-14 DIAGNOSIS — N1831 Chronic kidney disease, stage 3a: Secondary | ICD-10-CM | POA: Diagnosis not present

## 2020-01-16 DIAGNOSIS — N1831 Chronic kidney disease, stage 3a: Secondary | ICD-10-CM | POA: Diagnosis not present

## 2020-01-16 DIAGNOSIS — I129 Hypertensive chronic kidney disease with stage 1 through stage 4 chronic kidney disease, or unspecified chronic kidney disease: Secondary | ICD-10-CM | POA: Diagnosis not present

## 2020-01-16 DIAGNOSIS — R3129 Other microscopic hematuria: Secondary | ICD-10-CM | POA: Diagnosis not present

## 2020-01-16 DIAGNOSIS — R809 Proteinuria, unspecified: Secondary | ICD-10-CM | POA: Diagnosis not present

## 2020-01-17 DIAGNOSIS — L03113 Cellulitis of right upper limb: Secondary | ICD-10-CM | POA: Diagnosis not present

## 2020-01-18 DIAGNOSIS — N183 Chronic kidney disease, stage 3 unspecified: Secondary | ICD-10-CM | POA: Diagnosis not present

## 2020-01-18 DIAGNOSIS — L03113 Cellulitis of right upper limb: Secondary | ICD-10-CM | POA: Diagnosis not present

## 2020-01-18 DIAGNOSIS — L0390 Cellulitis, unspecified: Secondary | ICD-10-CM | POA: Insufficient documentation

## 2020-01-18 DIAGNOSIS — C9110 Chronic lymphocytic leukemia of B-cell type not having achieved remission: Secondary | ICD-10-CM | POA: Diagnosis not present

## 2020-01-18 DIAGNOSIS — S51811A Laceration without foreign body of right forearm, initial encounter: Secondary | ICD-10-CM | POA: Diagnosis not present

## 2020-01-18 DIAGNOSIS — D638 Anemia in other chronic diseases classified elsewhere: Secondary | ICD-10-CM | POA: Diagnosis not present

## 2020-01-18 DIAGNOSIS — I160 Hypertensive urgency: Secondary | ICD-10-CM | POA: Diagnosis not present

## 2020-01-18 DIAGNOSIS — Z66 Do not resuscitate: Secondary | ICD-10-CM | POA: Diagnosis not present

## 2020-01-18 DIAGNOSIS — D849 Immunodeficiency, unspecified: Secondary | ICD-10-CM | POA: Diagnosis not present

## 2020-01-18 DIAGNOSIS — D72820 Lymphocytosis (symptomatic): Secondary | ICD-10-CM | POA: Diagnosis not present

## 2020-01-18 DIAGNOSIS — R2231 Localized swelling, mass and lump, right upper limb: Secondary | ICD-10-CM | POA: Diagnosis not present

## 2020-01-18 DIAGNOSIS — I5032 Chronic diastolic (congestive) heart failure: Secondary | ICD-10-CM | POA: Diagnosis not present

## 2020-01-18 DIAGNOSIS — E039 Hypothyroidism, unspecified: Secondary | ICD-10-CM | POA: Diagnosis not present

## 2020-01-18 DIAGNOSIS — I130 Hypertensive heart and chronic kidney disease with heart failure and stage 1 through stage 4 chronic kidney disease, or unspecified chronic kidney disease: Secondary | ICD-10-CM | POA: Diagnosis not present

## 2020-01-18 DIAGNOSIS — Z950 Presence of cardiac pacemaker: Secondary | ICD-10-CM | POA: Diagnosis not present

## 2020-01-18 DIAGNOSIS — D696 Thrombocytopenia, unspecified: Secondary | ICD-10-CM | POA: Diagnosis not present

## 2020-01-18 DIAGNOSIS — I442 Atrioventricular block, complete: Secondary | ICD-10-CM | POA: Diagnosis not present

## 2020-01-18 HISTORY — DX: Cellulitis, unspecified: L03.90

## 2020-01-24 DIAGNOSIS — J449 Chronic obstructive pulmonary disease, unspecified: Secondary | ICD-10-CM | POA: Diagnosis not present

## 2020-01-25 DIAGNOSIS — C9110 Chronic lymphocytic leukemia of B-cell type not having achieved remission: Secondary | ICD-10-CM | POA: Diagnosis not present

## 2020-01-25 DIAGNOSIS — Z7189 Other specified counseling: Secondary | ICD-10-CM | POA: Diagnosis not present

## 2020-01-25 DIAGNOSIS — L03113 Cellulitis of right upper limb: Secondary | ICD-10-CM | POA: Diagnosis not present

## 2020-03-11 ENCOUNTER — Ambulatory Visit (INDEPENDENT_AMBULATORY_CARE_PROVIDER_SITE_OTHER): Payer: Medicare Other

## 2020-03-11 DIAGNOSIS — I442 Atrioventricular block, complete: Secondary | ICD-10-CM

## 2020-03-12 LAB — CUP PACEART REMOTE DEVICE CHECK
Battery Impedance: 664 Ohm
Battery Remaining Longevity: 98 mo
Battery Voltage: 2.78 V
Brady Statistic AP VP Percent: 2 %
Brady Statistic AP VS Percent: 7 %
Brady Statistic AS VP Percent: 1 %
Brady Statistic AS VS Percent: 91 %
Date Time Interrogation Session: 20211005174925
Implantable Lead Implant Date: 20020322
Implantable Lead Implant Date: 20030322
Implantable Lead Location: 753859
Implantable Lead Location: 753860
Implantable Lead Model: 5092
Implantable Lead Model: 5592
Implantable Pulse Generator Implant Date: 20110526
Lead Channel Impedance Value: 371 Ohm
Lead Channel Impedance Value: 373 Ohm
Lead Channel Pacing Threshold Amplitude: 0.75 V
Lead Channel Pacing Threshold Amplitude: 0.875 V
Lead Channel Pacing Threshold Pulse Width: 0.4 ms
Lead Channel Pacing Threshold Pulse Width: 0.4 ms
Lead Channel Setting Pacing Amplitude: 1.75 V
Lead Channel Setting Pacing Amplitude: 2 V
Lead Channel Setting Pacing Pulse Width: 0.4 ms
Lead Channel Setting Sensing Sensitivity: 2 mV

## 2020-03-14 NOTE — Progress Notes (Signed)
Remote pacemaker transmission.   

## 2020-03-24 DIAGNOSIS — N1831 Chronic kidney disease, stage 3a: Secondary | ICD-10-CM | POA: Diagnosis not present

## 2020-03-24 DIAGNOSIS — Z23 Encounter for immunization: Secondary | ICD-10-CM | POA: Diagnosis not present

## 2020-03-25 DIAGNOSIS — J449 Chronic obstructive pulmonary disease, unspecified: Secondary | ICD-10-CM | POA: Diagnosis not present

## 2020-03-26 DIAGNOSIS — N1831 Chronic kidney disease, stage 3a: Secondary | ICD-10-CM | POA: Diagnosis not present

## 2020-03-26 DIAGNOSIS — I130 Hypertensive heart and chronic kidney disease with heart failure and stage 1 through stage 4 chronic kidney disease, or unspecified chronic kidney disease: Secondary | ICD-10-CM | POA: Diagnosis not present

## 2020-03-26 DIAGNOSIS — E559 Vitamin D deficiency, unspecified: Secondary | ICD-10-CM | POA: Insufficient documentation

## 2020-03-26 DIAGNOSIS — I5030 Unspecified diastolic (congestive) heart failure: Secondary | ICD-10-CM | POA: Diagnosis not present

## 2020-03-26 DIAGNOSIS — C9110 Chronic lymphocytic leukemia of B-cell type not having achieved remission: Secondary | ICD-10-CM | POA: Diagnosis not present

## 2020-03-26 HISTORY — DX: Vitamin D deficiency, unspecified: E55.9

## 2020-03-28 ENCOUNTER — Other Ambulatory Visit: Payer: Self-pay | Admitting: Hematology and Oncology

## 2020-03-28 DIAGNOSIS — C9110 Chronic lymphocytic leukemia of B-cell type not having achieved remission: Secondary | ICD-10-CM | POA: Diagnosis not present

## 2020-03-28 LAB — BASIC METABOLIC PANEL
BUN: 29 — AB (ref 4–21)
CO2: 28 — AB (ref 13–22)
Chloride: 106 (ref 99–108)
Creatinine: 1.3 (ref 0.6–1.3)
Glucose: 101
Potassium: 4.1 (ref 3.4–5.3)
Sodium: 140 (ref 137–147)

## 2020-03-28 LAB — CBC: RBC: 3.26 — AB (ref 3.87–5.11)

## 2020-03-28 LAB — HEPATIC FUNCTION PANEL
ALT: 15 (ref 10–40)
AST: 31 (ref 14–40)
Alkaline Phosphatase: 57 (ref 25–125)

## 2020-03-28 LAB — COMPREHENSIVE METABOLIC PANEL
Albumin: 3 — AB (ref 3.5–5.0)
Calcium: 8 — AB (ref 8.7–10.7)

## 2020-03-28 LAB — CBC AND DIFFERENTIAL
HCT: 31 — AB (ref 41–53)
Hemoglobin: 10.3 — AB (ref 13.5–17.5)
Platelets: 141 — AB (ref 150–399)
WBC: 45.5

## 2020-04-16 DIAGNOSIS — Z23 Encounter for immunization: Secondary | ICD-10-CM | POA: Diagnosis not present

## 2020-04-25 DIAGNOSIS — J449 Chronic obstructive pulmonary disease, unspecified: Secondary | ICD-10-CM | POA: Diagnosis not present

## 2020-04-29 DIAGNOSIS — L570 Actinic keratosis: Secondary | ICD-10-CM | POA: Diagnosis not present

## 2020-04-29 DIAGNOSIS — C4442 Squamous cell carcinoma of skin of scalp and neck: Secondary | ICD-10-CM | POA: Diagnosis not present

## 2020-04-30 DIAGNOSIS — R6889 Other general symptoms and signs: Secondary | ICD-10-CM | POA: Diagnosis not present

## 2020-04-30 DIAGNOSIS — Z0000 Encounter for general adult medical examination without abnormal findings: Secondary | ICD-10-CM | POA: Diagnosis not present

## 2020-04-30 DIAGNOSIS — C9110 Chronic lymphocytic leukemia of B-cell type not having achieved remission: Secondary | ICD-10-CM | POA: Diagnosis not present

## 2020-04-30 DIAGNOSIS — J310 Chronic rhinitis: Secondary | ICD-10-CM | POA: Diagnosis not present

## 2020-05-25 DIAGNOSIS — J449 Chronic obstructive pulmonary disease, unspecified: Secondary | ICD-10-CM | POA: Diagnosis not present

## 2020-06-10 ENCOUNTER — Ambulatory Visit (INDEPENDENT_AMBULATORY_CARE_PROVIDER_SITE_OTHER): Payer: Medicare Other

## 2020-06-10 DIAGNOSIS — I442 Atrioventricular block, complete: Secondary | ICD-10-CM

## 2020-06-10 LAB — CUP PACEART REMOTE DEVICE CHECK
Battery Impedance: 769 Ohm
Battery Remaining Longevity: 89 mo
Battery Voltage: 2.79 V
Brady Statistic AP VP Percent: 1 %
Brady Statistic AP VS Percent: 6 %
Brady Statistic AS VP Percent: 1 %
Brady Statistic AS VS Percent: 92 %
Date Time Interrogation Session: 20220104152257
Implantable Lead Implant Date: 20020322
Implantable Lead Implant Date: 20030322
Implantable Lead Location: 753859
Implantable Lead Location: 753860
Implantable Lead Model: 5092
Implantable Lead Model: 5592
Implantable Pulse Generator Implant Date: 20110526
Lead Channel Impedance Value: 212 Ohm
Lead Channel Impedance Value: 353 Ohm
Lead Channel Pacing Threshold Amplitude: 0.75 V
Lead Channel Pacing Threshold Amplitude: 0.875 V
Lead Channel Pacing Threshold Pulse Width: 0.4 ms
Lead Channel Pacing Threshold Pulse Width: 0.4 ms
Lead Channel Setting Pacing Amplitude: 2 V
Lead Channel Setting Pacing Amplitude: 2 V
Lead Channel Setting Pacing Pulse Width: 0.4 ms
Lead Channel Setting Sensing Sensitivity: 2 mV

## 2020-06-11 DIAGNOSIS — J039 Acute tonsillitis, unspecified: Secondary | ICD-10-CM | POA: Diagnosis not present

## 2020-06-11 DIAGNOSIS — J310 Chronic rhinitis: Secondary | ICD-10-CM | POA: Diagnosis not present

## 2020-06-25 DIAGNOSIS — J449 Chronic obstructive pulmonary disease, unspecified: Secondary | ICD-10-CM | POA: Diagnosis not present

## 2020-06-25 NOTE — Progress Notes (Signed)
Remote pacemaker transmission.   

## 2020-07-01 DIAGNOSIS — R21 Rash and other nonspecific skin eruption: Secondary | ICD-10-CM | POA: Diagnosis not present

## 2020-07-13 DIAGNOSIS — S61317A Laceration without foreign body of left little finger with damage to nail, initial encounter: Secondary | ICD-10-CM | POA: Diagnosis not present

## 2020-07-22 DIAGNOSIS — S60419A Abrasion of unspecified finger, initial encounter: Secondary | ICD-10-CM | POA: Diagnosis not present

## 2020-07-22 DIAGNOSIS — L089 Local infection of the skin and subcutaneous tissue, unspecified: Secondary | ICD-10-CM | POA: Diagnosis not present

## 2020-07-26 DIAGNOSIS — J449 Chronic obstructive pulmonary disease, unspecified: Secondary | ICD-10-CM | POA: Diagnosis not present

## 2020-08-11 DIAGNOSIS — N1831 Chronic kidney disease, stage 3a: Secondary | ICD-10-CM | POA: Diagnosis not present

## 2020-08-13 ENCOUNTER — Other Ambulatory Visit: Payer: Self-pay | Admitting: Cardiology

## 2020-08-13 DIAGNOSIS — E559 Vitamin D deficiency, unspecified: Secondary | ICD-10-CM | POA: Diagnosis not present

## 2020-08-13 DIAGNOSIS — R3129 Other microscopic hematuria: Secondary | ICD-10-CM | POA: Diagnosis not present

## 2020-08-13 DIAGNOSIS — N1831 Chronic kidney disease, stage 3a: Secondary | ICD-10-CM | POA: Diagnosis not present

## 2020-08-13 DIAGNOSIS — R809 Proteinuria, unspecified: Secondary | ICD-10-CM | POA: Diagnosis not present

## 2020-08-13 DIAGNOSIS — I130 Hypertensive heart and chronic kidney disease with heart failure and stage 1 through stage 4 chronic kidney disease, or unspecified chronic kidney disease: Secondary | ICD-10-CM | POA: Diagnosis not present

## 2020-08-13 DIAGNOSIS — I5030 Unspecified diastolic (congestive) heart failure: Secondary | ICD-10-CM | POA: Diagnosis not present

## 2020-08-13 NOTE — Telephone Encounter (Signed)
Nitroglycerin approved and sent with 1rst attempt informing to arrange appt for med management. Last seen in 2020

## 2020-09-11 DIAGNOSIS — C9110 Chronic lymphocytic leukemia of B-cell type not having achieved remission: Secondary | ICD-10-CM | POA: Diagnosis not present

## 2020-09-11 DIAGNOSIS — S61012A Laceration without foreign body of left thumb without damage to nail, initial encounter: Secondary | ICD-10-CM | POA: Diagnosis not present

## 2020-09-11 DIAGNOSIS — L03113 Cellulitis of right upper limb: Secondary | ICD-10-CM | POA: Diagnosis not present

## 2020-09-11 DIAGNOSIS — L089 Local infection of the skin and subcutaneous tissue, unspecified: Secondary | ICD-10-CM | POA: Diagnosis not present

## 2020-09-11 DIAGNOSIS — S50811A Abrasion of right forearm, initial encounter: Secondary | ICD-10-CM | POA: Diagnosis not present

## 2020-09-24 ENCOUNTER — Other Ambulatory Visit: Payer: Self-pay | Admitting: Cardiology

## 2020-10-06 ENCOUNTER — Telehealth: Payer: Self-pay | Admitting: Cardiology

## 2020-10-06 NOTE — Telephone Encounter (Signed)
Spoke with patient's wife she states he took the third dose of NTG at 9:30pm last night. His blood pressure was 80/60 after taking all 3 doses of NTG. He went to sleep last night without problems. She states the patient is feeling okay today. She is going to re-check his blood pressure as we are on the phone. Patient was just laying in bed when chest pain started last night. 130/60 currently, patient denies symptoms at this time. When asked about the reasons for not being checked out last night she states "He fell asleep and I did not want to wake him" His wife asked about the patient's pacemaker being checked. After speaking with Sherri, RN it is determined patient will have to come in for a 1 year follow up. Patient's wife verbalized understanding, no further questions at this time.

## 2020-10-06 NOTE — Telephone Encounter (Signed)
[place], can you call and make routine follow up w/ Camnitz Tia Alert pt). Thanks

## 2020-10-06 NOTE — Telephone Encounter (Signed)
Pt c/o of Chest Pain: STAT if CP now or developed within 24 hours  1. Are you having CP right now? No  2. Are you experiencing any other symptoms (ex. SOB, nausea, vomiting, sweating)? NO  3. How long have you been experiencing CP? years  4. Is your CP continuous or coming and going? continpus  5. Have you taken Nitroglycerin? 3 tablets?

## 2020-10-21 ENCOUNTER — Encounter: Payer: Self-pay | Admitting: Cardiology

## 2020-10-28 DIAGNOSIS — L570 Actinic keratosis: Secondary | ICD-10-CM | POA: Diagnosis not present

## 2020-10-28 DIAGNOSIS — C4442 Squamous cell carcinoma of skin of scalp and neck: Secondary | ICD-10-CM | POA: Diagnosis not present

## 2020-10-28 DIAGNOSIS — C44219 Basal cell carcinoma of skin of left ear and external auricular canal: Secondary | ICD-10-CM | POA: Diagnosis not present

## 2020-11-10 DIAGNOSIS — N1831 Chronic kidney disease, stage 3a: Secondary | ICD-10-CM | POA: Diagnosis not present

## 2020-11-13 DIAGNOSIS — R809 Proteinuria, unspecified: Secondary | ICD-10-CM | POA: Diagnosis not present

## 2020-11-13 DIAGNOSIS — I5030 Unspecified diastolic (congestive) heart failure: Secondary | ICD-10-CM | POA: Diagnosis not present

## 2020-11-13 DIAGNOSIS — G8929 Other chronic pain: Secondary | ICD-10-CM | POA: Diagnosis not present

## 2020-11-13 DIAGNOSIS — I130 Hypertensive heart and chronic kidney disease with heart failure and stage 1 through stage 4 chronic kidney disease, or unspecified chronic kidney disease: Secondary | ICD-10-CM | POA: Diagnosis not present

## 2020-11-13 DIAGNOSIS — C9110 Chronic lymphocytic leukemia of B-cell type not having achieved remission: Secondary | ICD-10-CM | POA: Diagnosis not present

## 2020-11-13 DIAGNOSIS — N1831 Chronic kidney disease, stage 3a: Secondary | ICD-10-CM | POA: Diagnosis not present

## 2020-11-13 DIAGNOSIS — Z950 Presence of cardiac pacemaker: Secondary | ICD-10-CM | POA: Diagnosis not present

## 2020-11-28 NOTE — Progress Notes (Signed)
[facility]  [address] [place],  [place]  [address] [phone]  Clinic Day:  12/02/2020  Referring physician: [name], MD  This document serves as a record of services personally performed by Dequincy Macarthur Critchley, MD. It was created on their behalf by [facility], a trained medical scribe. The creation of this record is based on the scribe's personal observations and the provider's statements to them.  HISTORY OF PRESENT ILLNESS:  Michael Aguirre is a 80 y.o. 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  This disease has behaved very indolently to where no intervention has been necessary.  However, his hemoglobin has been gradually falling over the past few years, likely related to his underlying CLL.  He comes in today for routine follow up.  Since his last visit, the patient has been doing okay.  However, he has gotten progressively weaker over these past months.  He denies having any B symptoms or bulky lymphadenopathy which concerns him for catabolic progression of his CLL.  VITALS:  Blood pressure (!) 152/67, pulse 60, temperature 98.5 F (36.9 C), resp. rate 16, height 5\' 7"  (1.702 m), weight 166 lb (75.3 kg), SpO2 100 %.  Wt Readings from Last 3 Encounters:  12/02/20 166 lb (75.3 kg)  09/10/19 167 lb 12.8 oz (76.1 kg)  05/07/19 172 lb (78 kg)    Body mass index is 26 kg/m.  Performance status (ECOG): 2 - Symptomatic, <50% confined to bed  PHYSICAL EXAM:  Physical Exam Constitutional:      Appearance: Normal appearance. He is not ill-appearing.     Comments: He looks weaker versus previous visits  HENT:     Mouth/Throat:     Mouth: Mucous membranes are moist.     Pharynx: Oropharynx is clear. No oropharyngeal exudate or posterior oropharyngeal erythema.  Cardiovascular:     Rate and Rhythm: Normal rate and regular rhythm.     Heart sounds: No murmur heard.    No friction rub. No gallop.  Pulmonary:     Effort: Pulmonary effort is normal. No respiratory distress.     Breath sounds: Normal breath sounds. No wheezing, rhonchi or rales.  Chest:  Breasts:    Right: No axillary adenopathy or supraclavicular adenopathy.     Left: No axillary adenopathy or supraclavicular adenopathy.  Abdominal:     General: Bowel sounds are normal. There is no distension.     Palpations: Abdomen is soft. There is no mass.     Tenderness: There is no abdominal tenderness.  Musculoskeletal:        General: No swelling.     Right lower leg: No edema.     Left lower leg: No edema.  Lymphadenopathy:     Cervical: No cervical adenopathy.     Upper Body:     Right upper body: No supraclavicular or axillary adenopathy.     Left upper body: No supraclavicular or axillary adenopathy.     Lower Body: No right inguinal adenopathy. No left inguinal adenopathy.  Skin:    General: Skin is warm.     Coloration: Skin is not jaundiced.     Findings: No lesion or rash.  Neurological:     General: No focal deficit present.     Mental Status: He is alert and oriented to person, place, and time. Mental status is at baseline.     Cranial Nerves: Cranial nerves are  intact.  Psychiatric:        Mood and Affect: Mood normal.        Behavior: Behavior normal.        Thought Content: Thought content normal.    LABS:   CBC Latest Ref Rng & Units 12/02/2020 03/28/2020 03/27/2019  WBC - 58.4 45.5 45.7(HH)  Hemoglobin 13.5 - 17.5 9.6(A) 10.3(A) 10.6(L)  Hematocrit 41 - 53 30(A) 31(A) 32.9(L)  Platelets 150 - 399 141(A) 141(A) 130(L)   ASSESSMENT & PLAN:  Assessment/Plan:  An 80 year old gentleman with chronic lymphocytic leukemia.  His labs today show that his hemoglobin has fallen below 10.  This represents his disease further impacting his bone marrow to where it is no longer making an ideal amount of red cells.  As this marks him having a severe cytopenia from his CLL, it  relegates him needing to be placed on therapy for his disease.  He will be started on acalabrutinib 100 mg BID.  This medication will be mailed to him via the [facility] outpatient pharmacy.  I will see him back in 6 weeks to reassess his labs to see how well his CLL is responding to therapy.  I will see him back in 6 weeks for repeat clinical assessment.  The patient understands all the plans discussed today and is in agreement with them.     I, [name], am acting as scribe for Marice Potter, MD    I have reviewed this report as typed by the medical scribe, and it is complete and accurate.  Dequincy Macarthur Critchley, MD

## 2020-12-01 ENCOUNTER — Telehealth: Payer: Self-pay | Admitting: Oncology

## 2020-12-01 NOTE — Telephone Encounter (Signed)
Patient's spouse called to verify 6/28 Appt's

## 2020-12-02 ENCOUNTER — Inpatient Hospital Stay: Payer: Medicare Other

## 2020-12-02 ENCOUNTER — Telehealth: Payer: Self-pay | Admitting: Oncology

## 2020-12-02 ENCOUNTER — Other Ambulatory Visit: Payer: Self-pay | Admitting: Oncology

## 2020-12-02 ENCOUNTER — Inpatient Hospital Stay: Payer: Medicare Other | Attending: Oncology | Admitting: Oncology

## 2020-12-02 ENCOUNTER — Other Ambulatory Visit: Payer: Self-pay

## 2020-12-02 ENCOUNTER — Encounter: Payer: Self-pay | Admitting: Oncology

## 2020-12-02 VITALS — BP 152/67 | HR 60 | Temp 98.5°F | Resp 16 | Ht 67.0 in | Wt 166.0 lb

## 2020-12-02 DIAGNOSIS — C9110 Chronic lymphocytic leukemia of B-cell type not having achieved remission: Secondary | ICD-10-CM | POA: Diagnosis not present

## 2020-12-02 DIAGNOSIS — N189 Chronic kidney disease, unspecified: Secondary | ICD-10-CM

## 2020-12-02 DIAGNOSIS — D631 Anemia in chronic kidney disease: Secondary | ICD-10-CM

## 2020-12-02 LAB — CBC AND DIFFERENTIAL
HCT: 30 — AB (ref 41–53)
Hemoglobin: 9.6 — AB (ref 13.5–17.5)
Neutrophils Absolute: 19.86
Platelets: 141 — AB (ref 150–399)
WBC: 58.4

## 2020-12-02 LAB — CBC: RBC: 3.13 — AB (ref 3.87–5.11)

## 2020-12-02 NOTE — Telephone Encounter (Signed)
Per 6/28 LOS, patient scheduled for Aug Appt's.  Gave patient Appt Summary

## 2020-12-09 NOTE — Progress Notes (Signed)
Patient has a high co-pay on his Acalabrutinib, there is open co-pay assistance through [employer]. I called to discuss with patient and he stated that he is concerned about side effects and wants to speak with the doctor again before moving forward. Let him know I would send the Doctor a message and if he decides to move forward to just give me a call.

## 2020-12-10 DIAGNOSIS — H524 Presbyopia: Secondary | ICD-10-CM | POA: Diagnosis not present

## 2020-12-10 DIAGNOSIS — H5203 Hypermetropia, bilateral: Secondary | ICD-10-CM | POA: Diagnosis not present

## 2020-12-10 DIAGNOSIS — H2513 Age-related nuclear cataract, bilateral: Secondary | ICD-10-CM | POA: Diagnosis not present

## 2020-12-17 ENCOUNTER — Ambulatory Visit (INDEPENDENT_AMBULATORY_CARE_PROVIDER_SITE_OTHER): Payer: Medicare Other

## 2020-12-17 DIAGNOSIS — I442 Atrioventricular block, complete: Secondary | ICD-10-CM | POA: Diagnosis not present

## 2020-12-17 LAB — CUP PACEART REMOTE DEVICE CHECK
Battery Impedance: 843 Ohm
Battery Remaining Longevity: 85 mo
Battery Voltage: 2.78 V
Brady Statistic AP VP Percent: 2 %
Brady Statistic AP VS Percent: 6 %
Brady Statistic AS VP Percent: 1 %
Brady Statistic AS VS Percent: 91 %
Date Time Interrogation Session: 20220712164436
Implantable Lead Implant Date: 20020322
Implantable Lead Implant Date: 20030322
Implantable Lead Location: 753859
Implantable Lead Location: 753860
Implantable Lead Model: 5092
Implantable Lead Model: 5592
Implantable Pulse Generator Implant Date: 20110526
Lead Channel Impedance Value: 256 Ohm
Lead Channel Impedance Value: 381 Ohm
Lead Channel Pacing Threshold Amplitude: 0.625 V
Lead Channel Pacing Threshold Amplitude: 0.875 V
Lead Channel Pacing Threshold Pulse Width: 0.4 ms
Lead Channel Pacing Threshold Pulse Width: 0.4 ms
Lead Channel Setting Pacing Amplitude: 2 V
Lead Channel Setting Pacing Amplitude: 2 V
Lead Channel Setting Pacing Pulse Width: 0.4 ms
Lead Channel Setting Sensing Sensitivity: 2 mV

## 2020-12-19 ENCOUNTER — Telehealth: Payer: Self-pay

## 2020-12-19 NOTE — Telephone Encounter (Signed)
Analyce Tavares,RN  12/19/20 @ 2224- Pt's wife called back to ask who she contacts about the delivery of the [place]? She states Dr Bobby Rumpf had called them earlier.   Brae Schaafsma,RN:12/19/20 @ 1400 - I asked Dr Bobby Rumpf if he had time to call the pt yet. He replied, "Not yet".   Ignacio Lowder,RN: 12/19/20 @1205  -I spoke with pt. He is very worried about the side effects. He is concerned with quality of life vs treatment. He has had 3 siblings to die with cancer. He thinks maybe the treatments they had were too drastic. He wants to speak with Dr Bobby Rumpf either making an appt sooner or if Dr Bobby Rumpf wants to call him.  He also wants to know if he could just take the pills and not the immunoglobulin.

## 2020-12-23 NOTE — Progress Notes (Signed)
Patient has decided to take Acalabrutinib, he will need co-pay assistance. I called patient to discuss and had to leave a message asking him to return my call.

## 2020-12-23 NOTE — Progress Notes (Signed)
Spoke with patient and got him enrolled into the [employer]. Member ID: [id number] Gave information to [facility] and asked that they reactivate his Calquence Rx.

## 2020-12-24 NOTE — Progress Notes (Signed)
Patient will get his Calquence from [facility] on 12/25/2020, $0 co-pay.

## 2020-12-25 ENCOUNTER — Telehealth: Payer: Self-pay

## 2020-12-25 NOTE — Telephone Encounter (Addendum)
Pt called Michael Aguirre back, & confirmed appt for chemo ed.  Attempted call to pt, to give him chemo education appt for 12/31/20 @ 230pm.

## 2020-12-29 DIAGNOSIS — R21 Rash and other nonspecific skin eruption: Secondary | ICD-10-CM | POA: Diagnosis not present

## 2020-12-31 ENCOUNTER — Inpatient Hospital Stay: Payer: Medicare Other | Attending: Oncology | Admitting: Hematology and Oncology

## 2020-12-31 ENCOUNTER — Other Ambulatory Visit: Payer: Self-pay

## 2020-12-31 NOTE — Progress Notes (Signed)
The patient is an 80 year old with CLL.  Patient presents to clinic today for chemotherapy education and palliative care consult.  We will start acalabrutinib.  We will send in prescriptions for prochlorperazine and ondansetron.  The patient verbalizes understanding of and agreement to the plan as discussed today.  Provided general information including the following: 1.  Date of education: 12/31/2020 2.  Physician name: Dr. Bobby Rumpf 3.  Diagnosis: CLL 4.  Stage: Unstaged 5.  Control 6.  Chemotherapy plan including drugs and how often: Acalabrutinib 7.  Start date: pending authorization 8.  Other referrals: None at this time 9.  The patient is to call our office with any questions or concerns.  Our office number [phone], if after hours or on the weekend, call the same number and wait for the answering service.  There is always an oncologist on call 10.  Medications prescribed: zofran and compazine 11.  The patient has verbalized understanding of the treatment plan and has no barriers to adherence or understanding.  Obtained signed consent from patient.  Discussed symptoms including 1.  Low blood counts including red blood cells, white blood cells and platelets. 2. Infection including to avoid large crowds, wash hands frequently, and stay away from people who were sick.  If fever develops of 100.4 or higher, call our office. 3.  Mucositis-given instructions on mouth rinse (baking soda and salt mixture).  Keep mouth clean.  Use soft bristle toothbrush.  If mouth sores develop, call our clinic. 4.  Nausea/vomiting-gave prescriptions for ondansetron 4 mg every 4 hours as needed for nausea, may take around the clock if persistent.  Compazine 10 mg every 6 hours, may take around the clock if persistent. 5.  Diarrhea-use over-the-counter Imodium.  Call clinic if not controlled. 6.  Constipation-use senna, 1 to 2 tablets twice a day.  If no BM in 2 to 3 days call the clinic. 7.  Loss of  appetite-try to eat small meals every 2-3 hours.  Call clinic if not eating. 8.  Taste changes-zinc 500 mg daily.  If becomes severe call clinic. 9.  Alcoholic beverages. 10.  Drink 2 to 3 quarts of water per day. 11.  Peripheral neuropathy-patient to call if numbness or tingling in hands or feet is persistent  Gave information on the supportive care team and how to contact them regarding services.  Discussed advanced directives.  The patient does not have their advanced directives but will look at the copy provided in their notebook and will call with any questions. [employer] Social worker Advanced directives  Answered questions to patient satisfaction.  Patient is to call with any further questions or concerns.   The medication prescribed to the patient will be printed out from [email or web address] This will give the following information: Name of your medication Approved uses Dose and schedule Storage and handling Handling body fluids and waste Drug and food interactions Possible side effects and management Pregnancy, sexual activity, and contraception Obtaining medication   Michael Scrape, FNP- [facility]

## 2021-01-01 NOTE — Progress Notes (Signed)
[facility]  [address] [place],  [place]  [address] [phone]  Clinic Day:  01/13/2021  Referring physician: [name], MD  This document serves as a record of services personally performed by [name], MD. It was created on their behalf by [name], a trained medical scribe. The creation of this record is based on the scribe's personal observations and the provider's statements to them.  HISTORY OF PRESENT ILLNESS:  Michael Aguirre is a 80 y.o. 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  Due to his disease leading to significant anemia recently, he was placed on acalabrutinib 100 mg twice daily in late July 2022.  He initially had mouth sores, which have dissipated.  Although he remains apprehensive about taking this medication for his CLL, he understands why it is necessary.  VITALS:  Blood pressure (!) 147/65, pulse 60, temperature 98.2 F (36.8 C), resp. rate 16, height '5\' 7"'$  (1.702 m), weight 159 lb 11.2 oz (72.4 kg), SpO2 100 %.  Wt Readings from Last 3 Encounters:  01/13/21 159 lb 11.2 oz (72.4 kg)  01/05/21 160 lb 12.8 oz (72.9 kg)  12/31/20 162 lb 3.2 oz (73.6 kg)    Body mass index is 25.01 kg/m.  Performance status (ECOG): 2 - Symptomatic, <50% confined to bed  PHYSICAL EXAM:  Physical Exam Constitutional:      Appearance: Normal appearance. He is not ill-appearing.     Comments: He looks weak  HENT:     Mouth/Throat:     Mouth: Mucous membranes are moist.     Pharynx: Oropharynx is clear. No oropharyngeal exudate or posterior oropharyngeal erythema.  Cardiovascular:     Rate and Rhythm: Normal rate and regular rhythm.     Heart sounds: No murmur heard.   No friction rub. No gallop.  Pulmonary:     Effort: Pulmonary effort is normal. No respiratory distress.     Breath sounds: Normal breath sounds. No wheezing, rhonchi or rales.   Abdominal:     General: Bowel sounds are normal. There is no distension.     Palpations: Abdomen is soft. There is no mass.     Tenderness: There is no abdominal tenderness.  Musculoskeletal:        General: No swelling.     Right lower leg: No edema.     Left lower leg: No edema.  Lymphadenopathy:     Cervical: No cervical adenopathy.     Upper Body:     Right upper body: No supraclavicular or axillary adenopathy.     Left upper body: No supraclavicular or axillary adenopathy.     Lower Body: No right inguinal adenopathy. No left inguinal adenopathy.  Skin:    General: Skin is warm.     Coloration: Skin is not jaundiced.     Findings: No lesion or rash.  Neurological:     General: No focal deficit present.     Mental Status: He is alert and oriented to person, place, and time. Mental status is at baseline.     Cranial Nerves: Cranial nerves are intact.  Psychiatric:        Mood and Affect: Mood normal.        Behavior: Behavior normal.        Thought Content: Thought content normal.    LABS:   CBC Latest Ref Rng & Units 01/13/2021 01/05/2021 12/02/2020  WBC -  147.2 70.4 58.4  Hemoglobin 13.5 - 17.5 8.8(A) 8.8(A) 9.6(A)  Hematocrit 41 - 53 28(A) 29(A) 30(A)  Platelets 150 - 399 192 161 141(A)   ASSESSMENT & PLAN:  Assessment/Plan:  An 80 year old gentleman with chronic lymphocytic leukemia.  His labs today show how briskly his white count has risen since being started on acalabruinib recently.  I explained to the patient and his wife that this is what usually happens when BTK therapy is given, before one's white count abruptly begins to fall.  More importantly, the goal of his acalabrutinib therapy is to bring his CLL burden down to where his hemoglobin improves.  There has been a rise in his platelets, which suggests an early sign of his therapy working.  I will see him back in 6 weeks to reassess his labs to see how well his CLL is responding to his acalabrutinib therapy.  The  patient understands all the plans discussed today and is in agreement with them.     I, [name], am acting as scribe for Marice Potter, MD    I have reviewed this report as typed by the medical scribe, and it is complete and accurate.  [name], MD

## 2021-01-03 ENCOUNTER — Other Ambulatory Visit: Payer: Self-pay | Admitting: Hematology and Oncology

## 2021-01-03 MED ORDER — ALPRAZOLAM 0.5 MG PO TABS: 0.5000 mg | ORAL_TABLET | Freq: Two times a day (BID) | ORAL | 0 refills | Status: DC | PRN

## 2021-01-03 MED ORDER — HYDROCOD POLST-CPM POLST ER 10-8 MG/5ML PO SUER: 5.0000 mL | Freq: Two times a day (BID) | ORAL | 0 refills | Status: AC | PRN

## 2021-01-03 NOTE — Progress Notes (Signed)
Call from [place], [place], answering service 7/29, he and wife state he developed mouth sores after 3 doses of alacabrutinib. Advised them to hold until we can see him Monday.Nurse to cal in [facility] solution. Spoke with his wife today, mouth sores seem a bit better, they have not gotten prescription yet. She states she asked Melissa to send in Tussionex cough syrup and alprazolam 0.'5mg'$  at their visit on 7/27, but she doesn't have those prescriptions either. Rx  sent. Call pharmacy, they have order from answering service, getting it ready.

## 2021-01-05 ENCOUNTER — Ambulatory Visit: Payer: Medicare Other | Admitting: Hematology and Oncology

## 2021-01-05 ENCOUNTER — Other Ambulatory Visit: Payer: Self-pay | Admitting: Hematology and Oncology

## 2021-01-05 ENCOUNTER — Inpatient Hospital Stay: Payer: Medicare Other | Attending: Oncology | Admitting: Hematology and Oncology

## 2021-01-05 ENCOUNTER — Inpatient Hospital Stay: Payer: Medicare Other

## 2021-01-05 ENCOUNTER — Telehealth: Payer: Self-pay | Admitting: Hematology and Oncology

## 2021-01-05 VITALS — BP 123/62 | HR 60 | Temp 98.2°F | Resp 18 | Ht 67.0 in | Wt 160.8 lb

## 2021-01-05 DIAGNOSIS — D630 Anemia in neoplastic disease: Secondary | ICD-10-CM | POA: Insufficient documentation

## 2021-01-05 DIAGNOSIS — C9110 Chronic lymphocytic leukemia of B-cell type not having achieved remission: Secondary | ICD-10-CM | POA: Diagnosis not present

## 2021-01-05 LAB — CBC AND DIFFERENTIAL
HCT: 29 — AB (ref 41–53)
Hemoglobin: 8.8 — AB (ref 13.5–17.5)
Neutrophils Absolute: 1.41
Platelets: 161 (ref 150–399)
WBC: 70.4

## 2021-01-05 LAB — CBC: RBC: 3.04 — AB (ref 3.87–5.11)

## 2021-01-05 NOTE — Telephone Encounter (Signed)
Per Michael Aguirre, patient needs to be seen today due to reaction to Medication

## 2021-01-05 NOTE — Progress Notes (Signed)
[facility]  [address] [place],  [place]  [address] [phone]  Clinic Day:  01/06/2021  Referring physician: [name], MD   HISTORY OF PRESENT ILLNESS:  Michael Aguirre is a 80 y.o. 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  He was started on acalabrutinib and completed three doses before experiencing mouth sores and diarrhea over the weekend. He was instructed to hold treatment until evaluated in the clinic today. This afternoon, he states his symptoms have almost resolved. His mouth sores are gone and the diarrhea is improving. He also had headaches, which he states are somewhat better as well. He denies fever, chills, nausea or vomiting. He denies shortness of breath, chest pain or cough. CBC today reveals elevated WBC at 70.4 and hemoglobin 8.8. VITALS:  Blood pressure 123/62, pulse 60, temperature 98.2 F (36.8 C), temperature source Oral, resp. rate 18, height '5\' 7"'$  (1.702 m), weight 160 lb 12.8 oz (72.9 kg), SpO2 97 %.  Wt Readings from Last 3 Encounters:  01/05/21 160 lb 12.8 oz (72.9 kg)  12/31/20 162 lb 3.2 oz (73.6 kg)  12/02/20 166 lb (75.3 kg)    Body mass index is 25.18 kg/m.  Performance status (ECOG): 2 - Symptomatic, <50% confined to bed  PHYSICAL EXAM:  Physical Exam Constitutional:      General: He is not in acute distress.    Appearance: Normal appearance. He is normal weight. He is not ill-appearing, toxic-appearing or diaphoretic.  HENT:     Head: Normocephalic and atraumatic.     Nose: Nose normal. No congestion or rhinorrhea.     Mouth/Throat:     Mouth: Mucous membranes are moist.     Pharynx: Oropharynx is clear. No oropharyngeal exudate or posterior oropharyngeal erythema.  Eyes:     General: No scleral icterus.       Right eye: No discharge.        Left eye: No discharge.     Extraocular Movements: Extraocular movements intact.      Conjunctiva/sclera: Conjunctivae normal.     Pupils: Pupils are equal, round, and reactive to light.  Neck:     Vascular: No carotid bruit.  Cardiovascular:     Rate and Rhythm: Normal rate and regular rhythm.     Heart sounds: No murmur heard.   No friction rub. No gallop.  Pulmonary:     Effort: Pulmonary effort is normal. No respiratory distress.     Breath sounds: Normal breath sounds. No stridor. No wheezing, rhonchi or rales.  Chest:     Chest wall: No tenderness.  Abdominal:     General: Abdomen is flat. Bowel sounds are normal. There is no distension.     Palpations: There is no mass.     Tenderness: There is no abdominal tenderness. There is no right CVA tenderness, left CVA tenderness, guarding or rebound.     Hernia: No hernia is present.  Musculoskeletal:        General: No swelling, tenderness, deformity or signs of injury. Normal range of motion.     Cervical back: Normal range of motion and neck supple. No rigidity or tenderness.     Right lower leg: No edema.     Left lower leg: No edema.  Lymphadenopathy:     Cervical: No cervical adenopathy.  Skin:    General: Skin is warm and dry.     Capillary Refill: Capillary  refill takes less than 2 seconds.     Coloration: Skin is not jaundiced or pale.     Findings: Bruising present. No erythema, lesion or rash.  Neurological:     General: No focal deficit present.     Mental Status: He is alert and oriented to person, place, and time. Mental status is at baseline.     Cranial Nerves: No cranial nerve deficit.     Sensory: No sensory deficit.     Motor: No weakness.     Coordination: Coordination normal.     Gait: Gait normal.     Deep Tendon Reflexes: Reflexes normal.  Psychiatric:        Mood and Affect: Mood normal.        Behavior: Behavior normal.        Thought Content: Thought content normal.        Judgment: Judgment normal.    LABS:   CBC Latest Ref Rng & Units 01/05/2021 12/02/2020 03/28/2020  WBC -  70.4 58.4 45.5  Hemoglobin 13.5 - 17.5 8.8(A) 9.6(A) 10.3(A)  Hematocrit 41 - 53 29(A) 30(A) 31(A)  Platelets 150 - 399 161 141(A) 141(A)   ASSESSMENT & PLAN:  Assessment/Plan:  An 80 year old gentleman with chronic lymphocytic leukemia. As his symptoms have mostly resolved, we will have him start back on his acalabrutinib. We did discuss the patient's decreasing hemoglobin and his choice to not receive blood products. We will continue to monitor and may consider Retocrit injections for him to help increase his hemoglobin. I will have him repeat labs this week prior to the weekend to ensure we can establish treatment if needed.  Both the patient and his wife verbalize understanding of and agreement to the plans discussed today. They know to call the office should any new questions or concerns arise.   Dayton Scrape, FNP- [facility]

## 2021-01-08 ENCOUNTER — Telehealth: Payer: Self-pay

## 2021-01-08 ENCOUNTER — Other Ambulatory Visit: Payer: Self-pay | Admitting: Hematology and Oncology

## 2021-01-08 ENCOUNTER — Inpatient Hospital Stay: Payer: Medicare Other

## 2021-01-08 DIAGNOSIS — C9110 Chronic lymphocytic leukemia of B-cell type not having achieved remission: Secondary | ICD-10-CM

## 2021-01-08 NOTE — Telephone Encounter (Signed)
I spoke with pt. He is taking his Calquence about 9a-930a and then again around 9pm. He missed 5 doses since starting per provider instruction due to mouth sores. He was given MBX and the mouth sores have resolved. Pt denies having any new mouth sores since restarting the Calquence. Pt denies N/V, constipation/diarrhea, fevers, and headache. He does mention dry mouth. Pt reminded of the importance of calling us with temp of 100.4 or higher DAY OR NIGHT. I confirmed next appt with pt.

## 2021-01-08 NOTE — Progress Notes (Signed)
Patient here for labs. Reviewed results with patient and wife. He is currently on acalabrutinib. WBC today is 109.2 and hemoglobin 8.6. Patient does not wish to receive blood products for decreased hemoglobin. Discussed the use of Retacrit with the patient and gave handout. They would like to think about it and discuss with Dr. Bobby Rumpf.

## 2021-01-09 NOTE — Progress Notes (Signed)
Remote pacemaker transmission.   

## 2021-01-13 ENCOUNTER — Inpatient Hospital Stay: Payer: Medicare Other

## 2021-01-13 ENCOUNTER — Other Ambulatory Visit: Payer: Self-pay | Admitting: Oncology

## 2021-01-13 ENCOUNTER — Other Ambulatory Visit: Payer: Self-pay | Admitting: Hematology and Oncology

## 2021-01-13 ENCOUNTER — Inpatient Hospital Stay (INDEPENDENT_AMBULATORY_CARE_PROVIDER_SITE_OTHER): Payer: Medicare Other | Admitting: Oncology

## 2021-01-13 ENCOUNTER — Other Ambulatory Visit: Payer: Self-pay

## 2021-01-13 ENCOUNTER — Telehealth: Payer: Self-pay | Admitting: Oncology

## 2021-01-13 VITALS — BP 147/65 | HR 60 | Temp 98.2°F | Resp 16 | Ht 67.0 in | Wt 159.7 lb

## 2021-01-13 DIAGNOSIS — C9110 Chronic lymphocytic leukemia of B-cell type not having achieved remission: Secondary | ICD-10-CM | POA: Diagnosis not present

## 2021-01-13 LAB — CBC AND DIFFERENTIAL
HCT: 28 — AB (ref 41–53)
Hemoglobin: 8.8 — AB (ref 13.5–17.5)
Neutrophils Absolute: 7.36
Platelets: 192 (ref 150–399)
WBC: 147.2

## 2021-01-13 LAB — CBC: RBC: 2.99 — AB (ref 3.87–5.11)

## 2021-01-13 NOTE — Telephone Encounter (Signed)
Per 8/9 LOS, patient scheduled for 9/20 Labs, Follow Up

## 2021-01-15 ENCOUNTER — Telehealth: Payer: Self-pay

## 2021-01-15 NOTE — Telephone Encounter (Signed)
Pt's wife called to report that Michael Aguirre has developed a large reddish purplish place on the back of his right leg. They found it last night. It is not painful, nor warm to touch. No injury. He also has blisters on his tonsils. He noticed the blisters this morning when putting his dentures in.

## 2021-01-15 NOTE — Telephone Encounter (Addendum)
Kenetra Hildenbrand,RN: I notified pt's wife of Susan's response. She verbalized understating. ----- Message from Juanetta Beets, [facility] sent at 01/15/2021  2:17 PM EDT ----- Regarding: RE: Calquence BID He will probably bruise more easily now that he is on Calquence.  He should notify us or call 911 if he is bleeding anywhere.  Calquence can also increase risk of respiratory infection, so would rule that out, otherwise I don't think it is related to calquence. ----- Message ----- From: Dairl Ponder, RN Sent: 01/15/2021   1:23 PM EDT To: Juanetta Beets, RPH, Melodye Ped, NP Subject: Calquence BID                                  Pt's wife called to report that Francee Piccolo has developed a large reddish purplish place on the back of his right leg. They found it last night. It is not painful, nor warm to touch. No injury. He also has blisters on his tonsils. He noticed the blisters this morning when putting his dentures in. Please advise. It looks like last hgb was 8.8 2-3 days ago.

## 2021-01-19 ENCOUNTER — Telehealth: Payer: Self-pay

## 2021-01-19 ENCOUNTER — Other Ambulatory Visit: Payer: Self-pay | Admitting: Hematology and Oncology

## 2021-01-19 NOTE — Telephone Encounter (Signed)
Per Almira Bar NP.  Continue using the MMW, call if gets worse.  Call if fever, pain or difficulty swallowing.  Pt aware.

## 2021-01-22 ENCOUNTER — Telehealth: Payer: Self-pay

## 2021-01-22 NOTE — Telephone Encounter (Signed)
I spoke with pt and his wife,Valgene. They LVM on triage mail earlier wanting to confirm they understood correctly about the EPO shot. Dr Bobby Rumpf talked to them about ordering the injection if the Calquence didn't turn his disease burden around. Dr Bobby Rumpf feels like the Calquence will treat the CLL, thereby the kidneys will be able to produce more Hgb. He plans to check labs again @ Sept appt to make his decision. However, if pt is more symptomatic (having SOB, lightheadedness/dizziness), I can go ahead and order the injection. I explained the above to pt and his wife. He wants to hold off on injection @ this time.  While I had pt on the phone, I asked about his Calquence dosing & administration. Pt is taking Calquence '100mg'$  po BID, generally at [place], then 9p. He usually takes the medication with a snack. He hasn't missed any doses, since restarting the Calquence after a few days hold due to mouth sores. Pt denies mouth sores, fevers, diarrhea, fevers, and skin reactions (other then bruising easily). He has had a few episodes of intermittent nausea, but states not enough to take a pill for. Also, has had a few intermittent headaches, but nothing that didn't resolve easily. I reminded pt and his wife of the importance of calling us with temp of 100.4 or higher, DAY OR NIGHT. Both verbalized understanding.

## 2021-01-29 ENCOUNTER — Other Ambulatory Visit: Payer: Self-pay | Admitting: Hematology and Oncology

## 2021-01-29 ENCOUNTER — Telehealth: Payer: Self-pay

## 2021-01-29 DIAGNOSIS — C9110 Chronic lymphocytic leukemia of B-cell type not having achieved remission: Secondary | ICD-10-CM

## 2021-01-29 NOTE — Telephone Encounter (Signed)
I called pt back to get more details. 1. Pt states his left tonsil is still enlarged and has red blood on it, with clear white spots. 2. He also still has mouth sores, but they aren't as big as before. He is still talking the Calquence as prescribed. 3. Pt has noticed dark bowel movements. He isn't on any type of iron supplement. I do not see any blood thinners ordered. No BRBPR. No hematuria. Afebrile. Pt requesting your recommendations.

## 2021-01-29 NOTE — Telephone Encounter (Addendum)
Pt notified that he needs to be seen here tomorrow. Appt given.  ----- Message from Marvia Pickles, PA-C sent at 01/29/2021  1:45 PM EDT ----- Regarding: RE: enalrged tonsil, moth sores, dark stools Please add him in tomorrow with me, labs first, thanks!  ----- Message ----- From: Dairl Ponder, RN Sent: 01/29/2021  10:53 AM EDT To: Marice Potter, MD, Marvia Pickles, PA-C Subject: enalrged tonsil, moth sores, dark stools       I called pt back to get more details. 1. Pt states his left tonsil is still enlarged and has red blood on it, with clear white spots. 2. He also still has mouth sores, but they aren't as big as before. He is still talking the Calquence as prescribed. 3. Pt has noticed dark bowel movements. He isn't on any type of iron supplement. I do not see any blood thinners ordered. No BRBPR. No hematuria. Afebrile. Pt requesting your recommendations.

## 2021-01-30 ENCOUNTER — Inpatient Hospital Stay: Payer: Medicare Other

## 2021-01-30 ENCOUNTER — Encounter: Payer: Self-pay | Admitting: Hematology and Oncology

## 2021-01-30 ENCOUNTER — Inpatient Hospital Stay: Payer: Medicare Other | Admitting: Hematology and Oncology

## 2021-01-30 ENCOUNTER — Telehealth: Payer: Self-pay

## 2021-01-30 ENCOUNTER — Other Ambulatory Visit: Payer: Self-pay

## 2021-01-30 VITALS — BP 154/55 | HR 66 | Temp 97.9°F | Resp 18 | Ht 67.0 in | Wt 163.8 lb

## 2021-01-30 DIAGNOSIS — J351 Hypertrophy of tonsils: Secondary | ICD-10-CM | POA: Insufficient documentation

## 2021-01-30 DIAGNOSIS — C9110 Chronic lymphocytic leukemia of B-cell type not having achieved remission: Secondary | ICD-10-CM

## 2021-01-30 DIAGNOSIS — D631 Anemia in chronic kidney disease: Secondary | ICD-10-CM | POA: Insufficient documentation

## 2021-01-30 DIAGNOSIS — N189 Chronic kidney disease, unspecified: Secondary | ICD-10-CM | POA: Insufficient documentation

## 2021-01-30 DIAGNOSIS — D630 Anemia in neoplastic disease: Secondary | ICD-10-CM

## 2021-01-30 HISTORY — DX: Anemia in neoplastic disease: D63.0

## 2021-01-30 HISTORY — DX: Hypertrophy of tonsils: J35.1

## 2021-01-30 HISTORY — DX: Anemia in chronic kidney disease: D63.1

## 2021-01-30 LAB — CBC AND DIFFERENTIAL
HCT: 26 — AB (ref 41–53)
Hemoglobin: 8.1 — AB (ref 13.5–17.5)
Neutrophils Absolute: 4.41
Platelets: 168 (ref 150–399)
WBC: 146.9

## 2021-01-30 LAB — CBC: RBC: 2.75 — AB (ref 3.87–5.11)

## 2021-01-30 MED ORDER — AMOXICILLIN-POT CLAVULANATE 875-125 MG PO TABS: 1.0000 | ORAL_TABLET | Freq: Two times a day (BID) | ORAL | 0 refills | Status: DC

## 2021-01-30 MED ORDER — EPOETIN ALFA-EPBX 40000 UNIT/ML IJ SOLN
40000.0000 [IU] | Freq: Once | INTRAMUSCULAR | Status: AC
  Administered 2021-01-30: 40000 [IU] via SUBCUTANEOUS
  Filled 2021-01-30: qty 1

## 2021-01-30 MED ORDER — ALPRAZOLAM 0.5 MG PO TABS: 0.5000 mg | ORAL_TABLET | Freq: Two times a day (BID) | ORAL | 0 refills | Status: DC | PRN

## 2021-01-30 NOTE — Assessment & Plan Note (Signed)
The patient is tolerating acalabrutinib fairly well.  His total white count remains elevated, which is felt to be due to acalabrutinib. His mouth sores are controlled with Magic mouthwash.  We will plan to see him back in 3 weeks as previously scheduled for repeat clinical assessment.

## 2021-01-30 NOTE — Assessment & Plan Note (Addendum)
Significant enlargement of the left tonsil without exudate, most likely secondary to his chronic lymphocytic leukemia.  I will give him a course of Augmentin and refer him to ENT for consideration of tonsillectomy.

## 2021-01-30 NOTE — Assessment & Plan Note (Addendum)
Worsening anemia felt to be due to CLL. The patient does not take blood products due to his religion. We will initiate Retacrit 40,000 units every 4 weeks. I discussed the rare but serious side effect increased risk for blood clots, as well as presenting signs symptoms. Due to the report of dark stools, I will have him do stool Hemoccults x 3.

## 2021-01-30 NOTE — Telephone Encounter (Signed)
Received critical lab result call from Scottie in the lab. Pt's WBC is 146. Kelli,PA, aware.

## 2021-01-30 NOTE — Patient Instructions (Signed)
Epoetin Alfa injection What is this medication? EPOETIN ALFA (e POE e tin AL fa) helps your body make more red blood cells. This medicine is used to treat anemia caused by chronic kidney disease, cancer chemotherapy, or HIV-therapy. It may also be used before surgery if you have anemia. This medicine may be used for other purposes; ask your health care provider or pharmacist if you have questions. COMMON BRAND NAME(S): Epogen, Procrit, Retacrit What should I tell my care team before I take this medication? They need to know if you have any of these conditions: cancer heart disease high blood pressure history of blood clots history of stroke low levels of folate, iron, or vitamin B12 in the blood seizures an unusual or allergic reaction to erythropoietin, albumin, benzyl alcohol, hamster proteins, other medicines, foods, dyes, or preservatives pregnant or trying to get pregnant breast-feeding How should I use this medication? This medicine is for injection into a vein or under the skin. It is usually given by a health care professional in a hospital or clinic setting. If you get this medicine at home, you will be taught how to prepare and give this medicine. Use exactly as directed. Take your medicine at regular intervals. Do not take your medicine more often than directed. It is important that you put your used needles and syringes in a special sharps container. Do not put them in a trash can. If you do not have a sharps container, call your pharmacist or healthcare provider to get one. A special MedGuide will be given to you by the pharmacist with each prescription and refill. Be sure to read this information carefully each time. Talk to your pediatrician regarding the use of this medicine in children. While this drug may be prescribed for selected conditions, precautions do apply. Overdosage: If you think you have taken too much of this medicine contact a poison control center or emergency  room at once. NOTE: This medicine is only for you. Do not share this medicine with others. What if I miss a dose? If you miss a dose, take it as soon as you can. If it is almost time for your next dose, take only that dose. Do not take double or extra doses. What may interact with this medication? Interactions have not been studied. This list may not describe all possible interactions. Give your health care provider a list of all the medicines, herbs, non-prescription drugs, or dietary supplements you use. Also tell them if you smoke, drink alcohol, or use illegal drugs. Some items may interact with your medicine. What should I watch for while using this medication? Your condition will be monitored carefully while you are receiving this medicine. You may need blood work done while you are taking this medicine. This medicine may cause a decrease in vitamin B6. You should make sure that you get enough vitamin B6 while you are taking this medicine. Discuss the foods you eat and the vitamins you take with your health care professional. What side effects may I notice from receiving this medication? Side effects that you should report to your doctor or health care professional as soon as possible: allergic reactions like skin rash, itching or hives, swelling of the face, lips, or tongue seizures signs and symptoms of a blood clot such as breathing problems; changes in vision; chest pain; severe, sudden headache; pain, swelling, warmth in the leg; trouble speaking; sudden numbness or weakness of the face, arm or leg signs and symptoms of a stroke like   changes in vision; confusion; trouble speaking or understanding; severe headaches; sudden numbness or weakness of the face, arm or leg; trouble walking; dizziness; loss of balance or coordination Side effects that usually do not require medical attention (report to your doctor or health care professional if they continue or are  bothersome): chills cough dizziness fever headaches joint pain muscle cramps muscle pain nausea, vomiting pain, redness, or irritation at site where injected This list may not describe all possible side effects. Call your doctor for medical advice about side effects. You may report side effects to FDA at 1-800-FDA-1088. Where should I keep my medication? Keep out of the reach of children. Store in a refrigerator between 2 and 8 degrees C (36 and 46 degrees F). Do not freeze or shake. Throw away any unused portion if using a single-dose vial. Multi-dose vials can be kept in the refrigerator for up to 21 days after the initial dose. Throw away unused medicine. NOTE: This sheet is a summary. It may not cover all possible information. If you have questions about this medicine, talk to your doctor, pharmacist, or health care provider.  2022 Elsevier/Gold Standard (2016-12-31 08:35:19)  

## 2021-01-30 NOTE — Progress Notes (Signed)
1510: PT STABLE AT TIME OF DISCHARGE  

## 2021-01-30 NOTE — Progress Notes (Signed)
[facility]  [address] 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 The patient does not take blood products due to his religion. We will initiate Retacrit 40,000 units every 4 weeks. I discussed the rare but serious side effect increased risk for blood clots, as well as presenting signs symptoms. Due to the report of dark stools, I will have him do stool Hemoccults x 3.  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  I will give him a course of Augmentin and refer him to ENT for consideration of tonsillectomy.  CLL (chronic lymphocytic leukemia) ([place]) The patient is tolerating acalabrutinib fairly well.  His total white count remains elevated, which is felt to be due to acalabrutinib. His mouth sores are controlled with Magic mouthwash.  We will plan to see him back in 3 weeks as previously scheduled for repeat clinical assessment.   The patient understands the plans discussed today and is in agreement with them.  He knows to contact our office if he develops concerns prior to his next appointment.   I provided 40 minutes of face-to-face time during this encounter and > 50% was spent counseling as documented under my assessment and plan.    Marvia Pickles, PA-C   Orders Placed This Encounter  Procedures   Ferritin    Standing Status:   Future    Standing Expiration Date:   01/30/2022   Iron and TIBC    Standing Status:   Future    Standing Expiration Date:   01/30/2022   Folate    Standing Status:   Future    Standing Expiration Date:   01/30/2022   Vitamin B12    Standing Status:   Future    Standing Expiration Date:   01/30/2022      CHIEF COMPLAINT:  CC:  Left tonsillar  enlargement  Current Treatment:   Acalabrutinib for CLL   HISTORY OF PRESENT ILLNESS:   Oncology History  CLL (chronic lymphocytic leukemia) ([place])  2002 Initial Diagnosis   CLL (chronic lymphocytic leukemia) ([place])   12/23/2020 -  Chemotherapy   Acalabrutinib 100 mg twice daily          INTERVAL HISTORY:  Michael Aguirre is added to the schedule today his wife telephoned reporting swelling of his left tonsil.  He noticed fullness in his throat when swallowing. He denies pain in the area, sore throat, difficulty swallowing or difficulty breathing. He denies fevers or chills. He denies progressive fatigue concerning for worsening anemia.  He has noted intermittent dark stool, but no bright red blood in the stool. 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 His appetite is decreased, but he forces himself to eat. His weight has increased 3 pounds over last 3 weeks . We have him on alprazolam 0.5 mg twice daily as needed for anxiety or sleep.  REVIEW OF SYSTEMS:  Review of Systems  Constitutional:  Positive for appetite change and fatigue. Negative for chills, fever and unexpected weight change.  HENT:   Positive for mouth sores. Negative for lump/mass, sore throat, trouble swallowing and voice change.   Respiratory:  Negative for cough and shortness of breath.   Cardiovascular:  Negative for chest pain and leg swelling.  Gastrointestinal:  Negative for abdominal pain, constipation, diarrhea, nausea and vomiting.  Genitourinary:  Negative for difficulty urinating, dysuria, frequency and hematuria.   Musculoskeletal:  Negative for arthralgias and myalgias.  Skin:  Negative for itching, rash and wound.  Neurological:  Negative for dizziness, extremity weakness, headaches, light-headedness and numbness.  Hematological:  Negative for adenopathy.  Psychiatric/Behavioral:  Negative for depression and sleep disturbance. The patient is not nervous/anxious.     VITALS:  Blood pressure 140/63, pulse 67,  temperature 98.3 F (36.8 C), temperature source Oral, resp. rate 18, height '5\' 7"'$  (1.702 m), weight 163 lb 6.4 oz (74.1 kg), SpO2 98 %.  Wt Readings from Last 3 Encounters:  01/30/21 163 lb 12 oz (74.3 kg)  01/30/21 163 lb 6.4 oz (74.1 kg)  01/13/21 159 lb 11.2 oz (72.4 kg)    Body mass index is 25.59 kg/m.  Performance status (ECOG): 1 - Symptomatic but completely ambulatory  PHYSICAL EXAM:  Physical Exam Vitals and nursing note reviewed.  Constitutional:      General: He is not in acute distress.    Appearance: Normal appearance. He is normal weight.  HENT:     Head: Normocephalic and atraumatic.     Mouth/Throat:     Mouth: Mucous membranes are moist.     Pharynx: No oropharyngeal exudate or posterior oropharyngeal erythema.     Tonsils: No tonsillar exudate or tonsillar abscesses.     Comments: Significant swelling of the left tonsil Eyes:     General: No scleral icterus.    Extraocular Movements: Extraocular movements intact.     Conjunctiva/sclera: Conjunctivae normal.     Pupils: Pupils are equal, round, and reactive to light.  Cardiovascular:     Rate and Rhythm: Normal rate and regular rhythm.     Heart sounds: Normal heart sounds. No murmur heard.   No friction rub. No gallop.  Pulmonary:     Effort: Pulmonary effort is normal.     Breath sounds: Normal breath sounds. No wheezing, rhonchi or rales.  Abdominal:     General: Bowel sounds are normal. There is no distension.     Palpations: Abdomen is soft. There is no hepatomegaly, splenomegaly or mass.     Tenderness: There is no abdominal tenderness.  Musculoskeletal:        General: Normal range of motion.     Cervical back: Normal range of motion and neck supple. No tenderness.     Right lower leg: No edema.     Left lower leg: No edema.  Lymphadenopathy:     Cervical: No cervical adenopathy.     Upper Body:     Right upper body: No supraclavicular or axillary adenopathy.     Left upper body: No  supraclavicular or axillary adenopathy.     Lower Body: No right inguinal adenopathy. No left inguinal adenopathy.  Skin:    General: Skin is warm and dry.     Coloration: Skin is not jaundiced.     Findings: No rash.  Neurological:     Mental Status: He is alert and oriented to person, place, and time.     Cranial Nerves: No cranial nerve deficit.  Psychiatric:        Mood and Affect: Mood normal.        Behavior: Behavior normal.        Thought Content: Thought content normal.    LABS:   CBC Latest Ref Rng & Units 01/30/2021 01/13/2021 01/05/2021  WBC - 146.9 147.2 70.4  Hemoglobin 13.5 - 17.5 8.1(A) 8.8(A) 8.8(A)  Hematocrit 41 - 53 26(A) 28(A) 29(A)  Platelets 150 - 399 168 192 161   No results found for: CEA1 / No results found for: CEA1 No results found for: PSA1 No results found for: [id number] No results found for: CAN125  No results found for: TOTALPROTELP, ALBUMINELP, A1GS, A2GS, BETS, BETA2SER, GAMS, MSPIKE, SPEI No results found for: TIBC, FERRITIN, IRONPCTSAT No results found for: LDH  STUDIES:  No results found.    HISTORY:   Past Medical History:  Diagnosis Date   CLL (chronic lymphocytic leukemia) ([facility])    Sciatic nerve pain     Past Surgical History:  Procedure Laterality Date   INSERT / REPLACE / REMOVE PACEMAKER     [employer]    Family History  Problem Relation Age of Onset   Hypertension Mother    Hypertension Father    Throat cancer Sister    Colon cancer Brother     Social History:  reports that he has never smoked. He has never used smokeless tobacco. He reports that he does not currently use alcohol. He reports that he does not use drugs.The patient is accompanied by his wife today.  Allergies:  Allergies  Allergen Reactions   Tetracyclines & Related Hives    "Eyes swelling " "Eyes swelling "   Isosorbide Other (See Comments) and Itching    Headaches Headaches   Tetracycline Hives and Swelling    "Eyes swelling "   Other     Other  reaction(s): Other (See Comments) other Other reaction(s): Other (See Comments) "IV DYE"    Current Medications: Current Outpatient Medications  Medication Sig Dispense Refill   amoxicillin-clavulanate (AUGMENTIN) 875-125 MG tablet Take 1 tablet by mouth 2 (two) times daily. 10 tablet 0   lisinopril (ZESTRIL) 40 MG tablet Take 1 tablet by mouth daily.     ALPRAZolam (XANAX) 0.5 MG tablet Take 1 tablet (0.5 mg total) by mouth 2 (two) times daily as needed for anxiety. 60 tablet 0   AMITIZA 24 MCG capsule Take 24 mcg by mouth 2 (two) times daily.     amLODipine (NORVASC) 10 MG tablet Take 1 tablet (10 mg total) by mouth daily. 30 tablet 6   CALQUENCE 100 MG capsule Take by mouth.     chlorpheniramine-HYDROcodone (TUSSIONEX) 10-8 MG/5ML SUER Take 5 mLs by mouth every 12 (twelve) hours as needed for cough. 140 mL 0   [employer] ANTACID/ANTI-GAS 200-200-20 MG/5ML suspension Take by mouth.     finasteride (PROSCAR) 5 MG tablet Take 5 mg by mouth daily.  3   fluconazole (DIFLUCAN) 150 MG tablet Take by mouth.     furosemide (LASIX) 20 MG tablet Take 20 mg by mouth as needed.      hydrochlorothiazide (MICROZIDE) 12.5 MG capsule Take 12.5 mg by mouth daily.     levothyroxine (SYNTHROID) 50 MCG tablet Take 50 mcg by mouth daily before breakfast.     montelukast (SINGULAIR) 10 MG tablet Take 10 mg by mouth at bedtime.     mupirocin ointment (BACTROBAN) 2 % Apply 1 application topically 2 (two) times daily.     nitroGLYCERIN (NITROSTAT) 0.4 MG SL tablet Place 1 tablet (0.4 mg total) under the tongue every 5 (five) minutes as needed for chest pain. Patient needs an appointment for further refills. 2 nd attempt 15 tablet 0   ondansetron (ZOFRAN) 8 MG tablet Take 8 mg by mouth as needed for nausea or vomiting.  pregabalin (LYRICA) 75 MG capsule Take 75 mg by mouth daily.     tamsulosin (FLOMAX) 0.4 MG CAPS capsule Take 0.8 mg by mouth daily.     No current facility-administered medications for this visit.

## 2021-02-02 ENCOUNTER — Encounter: Payer: Self-pay | Admitting: Hematology and Oncology

## 2021-02-02 ENCOUNTER — Other Ambulatory Visit: Payer: Self-pay | Admitting: Hematology and Oncology

## 2021-02-02 MED ORDER — ALPRAZOLAM 0.5 MG PO TABS: 0.5000 mg | ORAL_TABLET | Freq: Two times a day (BID) | ORAL | 0 refills | Status: DC | PRN

## 2021-02-03 ENCOUNTER — Other Ambulatory Visit: Payer: Self-pay | Admitting: Hematology and Oncology

## 2021-02-04 ENCOUNTER — Inpatient Hospital Stay: Payer: Medicare Other

## 2021-02-04 ENCOUNTER — Other Ambulatory Visit: Payer: Self-pay | Admitting: Hematology and Oncology

## 2021-02-04 ENCOUNTER — Other Ambulatory Visit: Payer: Self-pay

## 2021-02-04 DIAGNOSIS — C9110 Chronic lymphocytic leukemia of B-cell type not having achieved remission: Secondary | ICD-10-CM | POA: Diagnosis not present

## 2021-02-04 DIAGNOSIS — K921 Melena: Secondary | ICD-10-CM

## 2021-02-04 NOTE — Progress Notes (Unsigned)
c 

## 2021-02-05 ENCOUNTER — Other Ambulatory Visit: Payer: Self-pay | Admitting: Hematology and Oncology

## 2021-02-05 ENCOUNTER — Telehealth: Payer: Self-pay

## 2021-02-05 ENCOUNTER — Inpatient Hospital Stay: Payer: Medicare Other | Attending: Oncology

## 2021-02-05 DIAGNOSIS — N189 Chronic kidney disease, unspecified: Secondary | ICD-10-CM | POA: Diagnosis not present

## 2021-02-05 DIAGNOSIS — C9110 Chronic lymphocytic leukemia of B-cell type not having achieved remission: Secondary | ICD-10-CM

## 2021-02-05 DIAGNOSIS — D631 Anemia in chronic kidney disease: Secondary | ICD-10-CM | POA: Diagnosis not present

## 2021-02-05 DIAGNOSIS — Z79899 Other long term (current) drug therapy: Secondary | ICD-10-CM | POA: Diagnosis not present

## 2021-02-05 DIAGNOSIS — D630 Anemia in neoplastic disease: Secondary | ICD-10-CM

## 2021-02-05 DIAGNOSIS — J351 Hypertrophy of tonsils: Secondary | ICD-10-CM | POA: Insufficient documentation

## 2021-02-05 LAB — CBC
MCV: 97 — AB (ref 80–94)
RBC: 2.99 — AB (ref 3.87–5.11)

## 2021-02-05 LAB — IRON AND TIBC
Iron: 48 ug/dL (ref 45–182)
Saturation Ratios: 17 % — ABNORMAL LOW (ref 17.9–39.5)
TIBC: 287 ug/dL (ref 250–450)
UIBC: 239 ug/dL

## 2021-02-05 LAB — CBC AND DIFFERENTIAL
HCT: 29 — AB (ref 41–53)
Hemoglobin: 8.7 — AB (ref 13.5–17.5)
Neutrophils Absolute: 2.57
Platelets: 154 (ref 150–399)
WBC: 128.7

## 2021-02-05 LAB — FOLATE: Folate: 9.5 ng/mL (ref >5.9)

## 2021-02-05 LAB — VITAMIN B12: Vitamin B-12: 456 pg/mL (ref 180–914)

## 2021-02-05 LAB — FERRITIN: Ferritin: 29 ng/mL (ref 24–336)

## 2021-02-05 NOTE — Telephone Encounter (Signed)
error 

## 2021-02-05 NOTE — Telephone Encounter (Addendum)
Melissa,NP, spoke with pt. She told him that his labs were good. Dr Bobby Rumpf told her to tell pt that he is responding to treatment and that it would just take some time.   I spoke with pt's wife, Dickie La. She will bring him in @ 3pm for labs.I told her to wait for results. She verbalized understanding.   @ 1256- Per Kelli,PA  - This is purpura, so we need to check his blood, his platelets were ok last week. Did he heve an injury to his arm? Have them draw all the labs ordered for his next visit. He needs to wait on the CBC, even though he won't take blood. Dr Bobby Rumpf may just need to eyeball him.  Mosher, Thalia Bloodgood, PA-C  Globe, Indiah Heyden W, RN His injection was in the abdomen, so not likely from the Retacrit. Let me decide what to do when we get the picture. Does she think the tonsil is worse?   I reported below information to [facility]. She states pt is on antibiotic @ present. She asked me to confim that pt doesn't have pain to the arm. I spoke with Valgene. Roger denies pain to the arm.     NEW FINDING: Left forearm red and swollen; 3/4 inch larger than right; Received: Today Dairl Ponder, RN  Mosher, Thalia Bloodgood, PA-C Pt's wife states, "We noticed yesterday it looked a little pink. This morning it looks worse. It is red and 3/4 inch larger than left arm. I can send a picture to someone if you'd like".  She denies the forearm feeling warm to touch. Afebrile   She also reports that his tonsil looks more pink today. ENT cant see him until 02/22/21.     I also questioned her regarding Rogers tolerance of the Calquence. She states he takes it @ 53m and 9 pm with food. Denies missed doses. No N/V. Afebrile. No constipation/diarrhea.  Mouth sores are better with use of MMW.

## 2021-02-13 ENCOUNTER — Telehealth: Payer: Self-pay

## 2021-02-13 NOTE — Telephone Encounter (Addendum)
Pt's wife notified and verbalized understanding of below.  ----- Message from Marvia Pickles, PA-C sent at 02/13/2021 10:06 AM EDT ----- Regarding: RE: Bruising on forearm, blister area on eye; COVID booster Yes, ma'am, thanks ----- Message ----- From: Dairl Ponder, RN Sent: 02/13/2021   9:54 AM EDT To: Marvia Pickles, PA-C Subject: RE: Bruising on forearm, blister area on eye#  Raylee. Did yo get to take a look at the pictures? So I can tell her  ----- Message ----- From: Marvia Pickles, PA-C Sent: 02/13/2021   9:48 AM EDT To: Dairl Ponder, RN Subject: RE: Bruising on forearm, blister area on eye#  I just feel like this is normal with his disease and treatment. Is he taking any aspirin or aspirin containing products? Of course, she should not hesitate to contact us though. The treatment appears to be working because his WBC was finally lower last week and his HgB had improved. OK to get the NEW COVID booster (may not be available yet). Thanks! ----- Message ----- From: Dairl Ponder, RN Sent: 02/13/2021   9:07 AM EDT To: Marvia Pickles, PA-C Subject: Bruising on forearm, blister area on eye; CO#  Pt's wife,Valgene, called to report that Roger's forearm has redness all around it now (larger than last week). He also has blister under his eye, & area on other hand. I have forwarded the pics to you that she sent me. She is also asking if he should go ahead and get the COVID booster?

## 2021-02-17 NOTE — Progress Notes (Signed)
[facility]  [address] [place],  [place]  [address] [phone]  Clinic Day:  02/24/2021  Referring physician: [name], MD  This document serves as a record of services personally performed by Dequincy Macarthur Critchley, MD. It was created on their behalf by [facility] E, a trained medical scribe. The creation of this record is based on the scribe's personal observations and the provider's statements to them.  HISTORY OF PRESENT ILLNESS:  The patient is an 80 y.o. 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  Due to his disease leading to significant anemia, he was placed on acalabrutinib 100 mg twice daily in late July 2022.  He comes in today to reassess his peripheral counts.  Since his last visit, the patient has been doing okay.  He is still fatigued, but it is not as significant as it was before.  Of note, he has been receiving monthly Retacrit injections for his hemoglobin being less than 10.  He also brings to my attention that he has an enlarged left tonsil for which he is contemplating having a tonsillectomy performed.    VITALS:  Blood pressure (!) 172/71, pulse 62, temperature 98 F (36.7 C), resp. rate 16, height '5\' 7"'$  (1.702 m), weight 166 lb 4.8 oz (75.4 kg), SpO2 97 %.  Wt Readings from Last 3 Encounters:  02/24/21 166 lb 4.8 oz (75.4 kg)  01/30/21 163 lb 12 oz (74.3 kg)  01/30/21 163 lb 6.4 oz (74.1 kg)    Body mass index is 26.05 kg/m.  Performance status (ECOG): 2 - Symptomatic, <50% confined to bed  PHYSICAL EXAM:  Physical Exam Constitutional:      General: He is not in acute distress.    Appearance: Normal appearance. He is normal weight.  HENT:     Head: Normocephalic and atraumatic.     Mouth/Throat:     Tonsils: 3+ on the left.     Comments: Asymmetrically enlarged left tonsil Eyes:     General: No scleral icterus.    Extraocular Movements:  Extraocular movements intact.     Conjunctiva/sclera: Conjunctivae normal.     Pupils: Pupils are equal, round, and reactive to light.  Cardiovascular:     Rate and Rhythm: Normal rate and regular rhythm.     Pulses: Normal pulses.     Heart sounds: Normal heart sounds. No murmur heard.   No friction rub. No gallop.  Pulmonary:     Effort: Pulmonary effort is normal. No respiratory distress.     Breath sounds: Normal breath sounds.  Abdominal:     General: Bowel sounds are normal. There is no distension.     Palpations: Abdomen is soft. There is no hepatomegaly, splenomegaly or mass.     Tenderness: There is no abdominal tenderness.  Musculoskeletal:        General: Normal range of motion.     Cervical back: Normal range of motion and neck supple.     Right lower leg: No edema.     Left lower leg: No edema.  Lymphadenopathy:     Cervical: No cervical adenopathy.  Skin:    General: Skin is warm and dry.  Neurological:     General: No focal deficit present.     Mental Status: He is alert and oriented to person, place, and time. Mental status is at baseline.  Psychiatric:        Mood and  Affect: Mood normal.        Behavior: Behavior normal.        Thought Content: Thought content normal.        Judgment: Judgment normal.    LABS:    ASSESSMENT & PLAN:  Assessment/Plan:  An 80 year old gentleman with chronic lymphocytic leukemia.  His labs today show how that his white count is starting to fall.  Furthermore, his hemoglobin is improving.  It appears he is starting to derive the benefits from his acalabrutinib therapy.  As his hemoglobin is still less than 10, he will continue to receive Retacrit 40,000 units monthly.  His CBC will continue to be checked on a monthly basis to see if his hemoglobin and white count are continuing to move in the right direction.  With respect to his enlarged left tonsil, I will leave it up to the patient and his ENT physician with respect to the  urgency/need to undergo a tonsillectomy.  I would not be surprised if his enlarged left tonsil is related to his CLL.  Personally, I would not recommend a tonsillectomy unless his swallowing or breathing becomes significantly impaired by his enlarged left tonsil.  Overall, he does appear to be improving.  I will see him back in 2 months for repeat clinical assessment.  The patient understands all the plans discussed today and is in agreement with them.    I, [name], am acting as scribe for Marice Potter, MD    I have reviewed this report as typed by the medical scribe, and it is complete and accurate.  Dequincy Macarthur Critchley, MD

## 2021-02-23 DIAGNOSIS — C9110 Chronic lymphocytic leukemia of B-cell type not having achieved remission: Secondary | ICD-10-CM | POA: Diagnosis not present

## 2021-02-23 DIAGNOSIS — R599 Enlarged lymph nodes, unspecified: Secondary | ICD-10-CM | POA: Diagnosis not present

## 2021-02-23 DIAGNOSIS — D490 Neoplasm of unspecified behavior of digestive system: Secondary | ICD-10-CM | POA: Diagnosis not present

## 2021-02-23 DIAGNOSIS — H6122 Impacted cerumen, left ear: Secondary | ICD-10-CM | POA: Diagnosis not present

## 2021-02-23 DIAGNOSIS — J343 Hypertrophy of nasal turbinates: Secondary | ICD-10-CM | POA: Diagnosis not present

## 2021-02-23 DIAGNOSIS — J351 Hypertrophy of tonsils: Secondary | ICD-10-CM | POA: Diagnosis not present

## 2021-02-23 DIAGNOSIS — D649 Anemia, unspecified: Secondary | ICD-10-CM | POA: Diagnosis not present

## 2021-02-23 DIAGNOSIS — R0981 Nasal congestion: Secondary | ICD-10-CM | POA: Diagnosis not present

## 2021-02-23 DIAGNOSIS — Z5111 Encounter for antineoplastic chemotherapy: Secondary | ICD-10-CM | POA: Diagnosis not present

## 2021-02-23 DIAGNOSIS — J342 Deviated nasal septum: Secondary | ICD-10-CM | POA: Diagnosis not present

## 2021-02-24 ENCOUNTER — Other Ambulatory Visit: Payer: Self-pay | Admitting: Hematology and Oncology

## 2021-02-24 ENCOUNTER — Other Ambulatory Visit: Payer: Self-pay | Admitting: Oncology

## 2021-02-24 ENCOUNTER — Inpatient Hospital Stay: Payer: Medicare Other

## 2021-02-24 ENCOUNTER — Inpatient Hospital Stay (INDEPENDENT_AMBULATORY_CARE_PROVIDER_SITE_OTHER): Payer: Medicare Other | Admitting: Oncology

## 2021-02-24 VITALS — BP 172/71 | HR 62 | Temp 98.0°F | Resp 16 | Ht 67.0 in | Wt 166.3 lb

## 2021-02-24 DIAGNOSIS — C9110 Chronic lymphocytic leukemia of B-cell type not having achieved remission: Secondary | ICD-10-CM

## 2021-02-24 DIAGNOSIS — J351 Hypertrophy of tonsils: Secondary | ICD-10-CM

## 2021-02-24 LAB — CBC AND DIFFERENTIAL
HCT: 30 — AB (ref 41–53)
Hemoglobin: 9.8 — AB (ref 13.5–17.5)
Neutrophils Absolute: 3.36
Platelets: 110 — AB (ref 150–399)
WBC: 84

## 2021-02-24 LAB — CBC
Absolute Lymphocytes: 80.64 — AB (ref 0.65–4.75)
MCV: 96 — AB (ref 80–94)
RBC: 3.18 — AB (ref 3.87–5.11)

## 2021-02-25 ENCOUNTER — Telehealth: Payer: Self-pay

## 2021-02-25 NOTE — Telephone Encounter (Signed)
02/25/21 @ 1052 - I asked Dr Bobby Rumpf his opinion regarding below. He replied, "all of that is fine with me".  I called pt back and gave him Dr Bobby Rumpf' response.    02/25/21 2 1003 -Pt's wife LVM on nurse triage line asking if it is ok for Michael Aguirre to get his COVID booster (5th shot), then 2 weeks later get his flu vaccine?

## 2021-02-27 ENCOUNTER — Other Ambulatory Visit: Payer: Self-pay | Admitting: Pharmacist

## 2021-02-27 ENCOUNTER — Telehealth: Payer: Self-pay | Admitting: Oncology

## 2021-02-27 NOTE — Telephone Encounter (Signed)
Called patient's spouse concerning scheduled Sept, Oct, Nov, Dec Inj, Labs, Follow Up Appt's.  She confirmed

## 2021-03-02 ENCOUNTER — Inpatient Hospital Stay: Payer: Medicare Other

## 2021-03-02 ENCOUNTER — Other Ambulatory Visit: Payer: Self-pay

## 2021-03-02 VITALS — BP 161/54 | HR 60 | Temp 98.1°F | Resp 18 | Ht 67.0 in | Wt 165.0 lb

## 2021-03-02 DIAGNOSIS — D631 Anemia in chronic kidney disease: Secondary | ICD-10-CM

## 2021-03-02 DIAGNOSIS — Z23 Encounter for immunization: Secondary | ICD-10-CM | POA: Diagnosis not present

## 2021-03-02 DIAGNOSIS — N189 Chronic kidney disease, unspecified: Secondary | ICD-10-CM

## 2021-03-02 DIAGNOSIS — J351 Hypertrophy of tonsils: Secondary | ICD-10-CM | POA: Diagnosis not present

## 2021-03-02 DIAGNOSIS — C9110 Chronic lymphocytic leukemia of B-cell type not having achieved remission: Secondary | ICD-10-CM | POA: Diagnosis not present

## 2021-03-02 DIAGNOSIS — Z79899 Other long term (current) drug therapy: Secondary | ICD-10-CM | POA: Diagnosis not present

## 2021-03-02 DIAGNOSIS — D630 Anemia in neoplastic disease: Secondary | ICD-10-CM | POA: Diagnosis not present

## 2021-03-02 MED ORDER — EPOETIN ALFA-EPBX 40000 UNIT/ML IJ SOLN
40000.0000 [IU] | Freq: Once | INTRAMUSCULAR | Status: AC
  Administered 2021-03-02: 40000 [IU] via SUBCUTANEOUS
  Filled 2021-03-02: qty 1

## 2021-03-02 NOTE — Patient Instructions (Signed)
Epoetin Alfa injection What is this medication? EPOETIN ALFA (e POE e tin AL fa) helps your body make more red blood cells. This medicine is used to treat anemia caused by chronic kidney disease, cancer chemotherapy, or HIV-therapy. It may also be used before surgery if you have anemia. This medicine may be used for other purposes; ask your health care provider or pharmacist if you have questions. COMMON BRAND NAME(S): Epogen, Procrit, Retacrit What should I tell my care team before I take this medication? They need to know if you have any of these conditions: cancer heart disease high blood pressure history of blood clots history of stroke low levels of folate, iron, or vitamin B12 in the blood seizures an unusual or allergic reaction to erythropoietin, albumin, benzyl alcohol, hamster proteins, other medicines, foods, dyes, or preservatives pregnant or trying to get pregnant breast-feeding How should I use this medication? This medicine is for injection into a vein or under the skin. It is usually given by a health care professional in a hospital or clinic setting. If you get this medicine at home, you will be taught how to prepare and give this medicine. Use exactly as directed. Take your medicine at regular intervals. Do not take your medicine more often than directed. It is important that you put your used needles and syringes in a special sharps container. Do not put them in a trash can. If you do not have a sharps container, call your pharmacist or healthcare provider to get one. A special MedGuide will be given to you by the pharmacist with each prescription and refill. Be sure to read this information carefully each time. Talk to your pediatrician regarding the use of this medicine in children. While this drug may be prescribed for selected conditions, precautions do apply. Overdosage: If you think you have taken too much of this medicine contact a poison control center or emergency  room at once. NOTE: This medicine is only for you. Do not share this medicine with others. What if I miss a dose? If you miss a dose, take it as soon as you can. If it is almost time for your next dose, take only that dose. Do not take double or extra doses. What may interact with this medication? Interactions have not been studied. This list may not describe all possible interactions. Give your health care provider a list of all the medicines, herbs, non-prescription drugs, or dietary supplements you use. Also tell them if you smoke, drink alcohol, or use illegal drugs. Some items may interact with your medicine. What should I watch for while using this medication? Your condition will be monitored carefully while you are receiving this medicine. You may need blood work done while you are taking this medicine. This medicine may cause a decrease in vitamin B6. You should make sure that you get enough vitamin B6 while you are taking this medicine. Discuss the foods you eat and the vitamins you take with your health care professional. What side effects may I notice from receiving this medication? Side effects that you should report to your doctor or health care professional as soon as possible: allergic reactions like skin rash, itching or hives, swelling of the face, lips, or tongue seizures signs and symptoms of a blood clot such as breathing problems; changes in vision; chest pain; severe, sudden headache; pain, swelling, warmth in the leg; trouble speaking; sudden numbness or weakness of the face, arm or leg signs and symptoms of a stroke like   changes in vision; confusion; trouble speaking or understanding; severe headaches; sudden numbness or weakness of the face, arm or leg; trouble walking; dizziness; loss of balance or coordination Side effects that usually do not require medical attention (report to your doctor or health care professional if they continue or are  bothersome): chills cough dizziness fever headaches joint pain muscle cramps muscle pain nausea, vomiting pain, redness, or irritation at site where injected This list may not describe all possible side effects. Call your doctor for medical advice about side effects. You may report side effects to FDA at 1-800-FDA-1088. Where should I keep my medication? Keep out of the reach of children. Store in a refrigerator between 2 and 8 degrees C (36 and 46 degrees F). Do not freeze or shake. Throw away any unused portion if using a single-dose vial. Multi-dose vials can be kept in the refrigerator for up to 21 days after the initial dose. Throw away unused medicine. NOTE: This sheet is a summary. It may not cover all possible information. If you have questions about this medicine, talk to your doctor, pharmacist, or health care provider.  2022 Elsevier/Gold Standard (2016-12-31 08:35:19)  

## 2021-03-09 ENCOUNTER — Telehealth: Payer: Self-pay

## 2021-03-09 NOTE — Telephone Encounter (Addendum)
Pt's wife notified of below.   ----- Message from Melodye Ped, NP sent at 03/09/2021  3:44 PM EDT ----- Regarding: RE: Calquence update Could be the medicine itself.We can add something if it becomes an issue. I would just encourage protein shakes when not hungry. I will send xanax. ----- Message ----- From: Dairl Ponder, RN Sent: 03/09/2021   2:33 PM EDT To: Melodye Ped, NP Subject: Calquence update                               03/09/21 @ 1417 - I spoke with pt and his wife. He is doing pretty good. He is taking his Calquence at [place], and 9p daily with a little snack. He missed 1 dose last week. He denies N/V, diarrhea, and fevers. No mouth sores @ present. He is 3 days into his new bottle. Pt does report that the tonsil feels like it is still growing. It is harder to swallow, but not painful. Appetite isn't good per pt. "I just don't want anything to eat". I reminded pt of the importance in calling us with temp of 100.4 or higher, DAY OR NIGHT. He verbalized understanding.  He needs refill of Xanax 90 days fill to [facility]. What do you think about his appetite?

## 2021-03-09 NOTE — Telephone Encounter (Signed)
03/09/21 @ 1417 - I spoke with pt and his wife. He is doing pretty good. He is taking his Calquence at [place], and 9p daily with a little snack. He missed 1 dose last week. He denies N/V, diarrhea, and fevers. No mouth sores @ present. He is 3 days into his new bottle. Pt does report that the tonsil feels like it is still growing. It is harder to swallow, but not painful. Appetite isn't good per pt. "I just don't want anything to eat". I reminded pt of the importance in calling us with temp of 100.4 or higher, DAY OR NIGHT. He verbalized understanding.

## 2021-03-13 DIAGNOSIS — N1831 Chronic kidney disease, stage 3a: Secondary | ICD-10-CM | POA: Diagnosis not present

## 2021-03-17 DIAGNOSIS — I129 Hypertensive chronic kidney disease with stage 1 through stage 4 chronic kidney disease, or unspecified chronic kidney disease: Secondary | ICD-10-CM | POA: Diagnosis not present

## 2021-03-17 DIAGNOSIS — Z950 Presence of cardiac pacemaker: Secondary | ICD-10-CM | POA: Diagnosis not present

## 2021-03-17 DIAGNOSIS — N1831 Chronic kidney disease, stage 3a: Secondary | ICD-10-CM | POA: Diagnosis not present

## 2021-03-17 DIAGNOSIS — E559 Vitamin D deficiency, unspecified: Secondary | ICD-10-CM | POA: Diagnosis not present

## 2021-03-18 ENCOUNTER — Telehealth: Payer: Self-pay

## 2021-03-18 NOTE — Telephone Encounter (Signed)
Pt's wife called to notify us that Francee Piccolo saw the nephrologist in [facility]. His kidney function results were all good. The only thing that was low, was his Vit D. The nephrologist asked them to call Dr Bobby Rumpf to make sure it is ok to restart taking Vit D. I asked Dr Bobby Rumpf for his recommendation. Dr Bobby Rumpf is fine with pt taking Vit D. I called pt's wife back and told her Dr Bobby Rumpf' response.

## 2021-03-19 DIAGNOSIS — J358 Other chronic diseases of tonsils and adenoids: Secondary | ICD-10-CM | POA: Diagnosis not present

## 2021-03-19 DIAGNOSIS — Z531 Procedure and treatment not carried out because of patient's decision for reasons of belief and group pressure: Secondary | ICD-10-CM

## 2021-03-19 DIAGNOSIS — IMO0001 Reserved for inherently not codable concepts without codable children: Secondary | ICD-10-CM

## 2021-03-19 DIAGNOSIS — Z789 Other specified health status: Secondary | ICD-10-CM | POA: Insufficient documentation

## 2021-03-19 HISTORY — DX: Reserved for inherently not codable concepts without codable children: IMO0001

## 2021-03-19 HISTORY — DX: Other specified health status: Z78.9

## 2021-03-20 ENCOUNTER — Telehealth: Payer: Self-pay

## 2021-03-20 NOTE — Telephone Encounter (Signed)
The patient states his loose lead is shocking him. He is supposed to have his tonsils removed soon, and was wondering if the lead needs to be fixed,  or removed before having his tonsils out? I let him speak with Benjamine Mola, rn.

## 2021-03-20 NOTE — Telephone Encounter (Signed)
See other phone encounter 03/20/21.

## 2021-03-30 ENCOUNTER — Other Ambulatory Visit: Payer: Self-pay | Admitting: Hematology and Oncology

## 2021-03-30 ENCOUNTER — Other Ambulatory Visit: Payer: Self-pay

## 2021-03-30 ENCOUNTER — Inpatient Hospital Stay: Payer: Medicare Other

## 2021-03-30 ENCOUNTER — Inpatient Hospital Stay: Payer: Medicare Other | Attending: Oncology

## 2021-03-30 VITALS — BP 132/48 | HR 50 | Temp 97.8°F | Resp 18 | Ht 67.0 in | Wt 164.8 lb

## 2021-03-30 DIAGNOSIS — C9110 Chronic lymphocytic leukemia of B-cell type not having achieved remission: Secondary | ICD-10-CM | POA: Diagnosis not present

## 2021-03-30 DIAGNOSIS — D649 Anemia, unspecified: Secondary | ICD-10-CM | POA: Insufficient documentation

## 2021-03-30 DIAGNOSIS — N189 Chronic kidney disease, unspecified: Secondary | ICD-10-CM

## 2021-03-30 DIAGNOSIS — D631 Anemia in chronic kidney disease: Secondary | ICD-10-CM

## 2021-03-30 LAB — CBC AND DIFFERENTIAL
HCT: 32 — AB (ref 41–53)
Hemoglobin: 10 — AB (ref 13.5–17.5)
Neutrophils Absolute: 2.72
Platelets: 125 — AB (ref 150–399)
WBC: 68.1

## 2021-03-30 LAB — CBC: RBC: 3.39 — AB (ref 3.87–5.11)

## 2021-03-30 MED ORDER — EPOETIN ALFA-EPBX 40000 UNIT/ML IJ SOLN
40000.0000 [IU] | Freq: Once | INTRAMUSCULAR | Status: AC
  Administered 2021-03-30: 40000 [IU] via SUBCUTANEOUS
  Filled 2021-03-30: qty 1

## 2021-03-30 NOTE — Patient Instructions (Signed)
Epoetin Alfa injection What is this medication? EPOETIN ALFA (e POE e tin AL fa) helps your body make more red blood cells. This medicine is used to treat anemia caused by chronic kidney disease, cancer chemotherapy, or HIV-therapy. It may also be used before surgery if you have anemia. This medicine may be used for other purposes; ask your health care provider or pharmacist if you have questions. COMMON BRAND NAME(S): Epogen, Procrit, Retacrit What should I tell my care team before I take this medication? They need to know if you have any of these conditions: cancer heart disease high blood pressure history of blood clots history of stroke low levels of folate, iron, or vitamin B12 in the blood seizures an unusual or allergic reaction to erythropoietin, albumin, benzyl alcohol, hamster proteins, other medicines, foods, dyes, or preservatives pregnant or trying to get pregnant breast-feeding How should I use this medication? This medicine is for injection into a vein or under the skin. It is usually given by a health care professional in a hospital or clinic setting. If you get this medicine at home, you will be taught how to prepare and give this medicine. Use exactly as directed. Take your medicine at regular intervals. Do not take your medicine more often than directed. It is important that you put your used needles and syringes in a special sharps container. Do not put them in a trash can. If you do not have a sharps container, call your pharmacist or healthcare provider to get one. A special MedGuide will be given to you by the pharmacist with each prescription and refill. Be sure to read this information carefully each time. Talk to your pediatrician regarding the use of this medicine in children. While this drug may be prescribed for selected conditions, precautions do apply. Overdosage: If you think you have taken too much of this medicine contact a poison control center or emergency  room at once. NOTE: This medicine is only for you. Do not share this medicine with others. What if I miss a dose? If you miss a dose, take it as soon as you can. If it is almost time for your next dose, take only that dose. Do not take double or extra doses. What may interact with this medication? Interactions have not been studied. This list may not describe all possible interactions. Give your health care provider a list of all the medicines, herbs, non-prescription drugs, or dietary supplements you use. Also tell them if you smoke, drink alcohol, or use illegal drugs. Some items may interact with your medicine. What should I watch for while using this medication? Your condition will be monitored carefully while you are receiving this medicine. You may need blood work done while you are taking this medicine. This medicine may cause a decrease in vitamin B6. You should make sure that you get enough vitamin B6 while you are taking this medicine. Discuss the foods you eat and the vitamins you take with your health care professional. What side effects may I notice from receiving this medication? Side effects that you should report to your doctor or health care professional as soon as possible: allergic reactions like skin rash, itching or hives, swelling of the face, lips, or tongue seizures signs and symptoms of a blood clot such as breathing problems; changes in vision; chest pain; severe, sudden headache; pain, swelling, warmth in the leg; trouble speaking; sudden numbness or weakness of the face, arm or leg signs and symptoms of a stroke like   changes in vision; confusion; trouble speaking or understanding; severe headaches; sudden numbness or weakness of the face, arm or leg; trouble walking; dizziness; loss of balance or coordination Side effects that usually do not require medical attention (report to your doctor or health care professional if they continue or are  bothersome): chills cough dizziness fever headaches joint pain muscle cramps muscle pain nausea, vomiting pain, redness, or irritation at site where injected This list may not describe all possible side effects. Call your doctor for medical advice about side effects. You may report side effects to FDA at 1-800-FDA-1088. Where should I keep my medication? Keep out of the reach of children. Store in a refrigerator between 2 and 8 degrees C (36 and 46 degrees F). Do not freeze or shake. Throw away any unused portion if using a single-dose vial. Multi-dose vials can be kept in the refrigerator for up to 21 days after the initial dose. Throw away unused medicine. NOTE: This sheet is a summary. It may not cover all possible information. If you have questions about this medicine, talk to your doctor, pharmacist, or health care provider.  2022 Elsevier/Gold Standard (2016-12-31 08:35:19)  

## 2021-03-30 NOTE — Progress Notes (Addendum)
The patient had labs prior to retacrit. His wife notified me that he is to have a tonsillectomy at [facility] on November 2nd. Due to this, I will proceed with retacrit today with a HbG of 10. I advised the patient and his wife to hold Calquence from 10/25 to at least 11/9, and gave them a written note, due to increased risk for bleeding associated with this. He may resume Calquence 11/10 if he is doing well. If he develops symptoms of worsening anemia, they know to contact our office. Both the patient and his wife verbalized understanding.

## 2021-04-01 DIAGNOSIS — G459 Transient cerebral ischemic attack, unspecified: Secondary | ICD-10-CM | POA: Diagnosis not present

## 2021-04-01 DIAGNOSIS — C9110 Chronic lymphocytic leukemia of B-cell type not having achieved remission: Secondary | ICD-10-CM | POA: Diagnosis not present

## 2021-04-01 DIAGNOSIS — Z9189 Other specified personal risk factors, not elsewhere classified: Secondary | ICD-10-CM | POA: Diagnosis not present

## 2021-04-01 DIAGNOSIS — D649 Anemia, unspecified: Secondary | ICD-10-CM | POA: Diagnosis not present

## 2021-04-01 DIAGNOSIS — N179 Acute kidney failure, unspecified: Secondary | ICD-10-CM | POA: Diagnosis not present

## 2021-04-01 DIAGNOSIS — Z139 Encounter for screening, unspecified: Secondary | ICD-10-CM | POA: Diagnosis not present

## 2021-04-01 DIAGNOSIS — Z950 Presence of cardiac pacemaker: Secondary | ICD-10-CM | POA: Diagnosis not present

## 2021-04-01 DIAGNOSIS — N1831 Chronic kidney disease, stage 3a: Secondary | ICD-10-CM | POA: Diagnosis not present

## 2021-04-01 DIAGNOSIS — Z01818 Encounter for other preprocedural examination: Secondary | ICD-10-CM | POA: Diagnosis not present

## 2021-04-01 DIAGNOSIS — R5381 Other malaise: Secondary | ICD-10-CM | POA: Diagnosis not present

## 2021-04-03 ENCOUNTER — Encounter: Payer: Self-pay | Admitting: Hematology and Oncology

## 2021-04-03 MED FILL — Ferumoxytol Inj 510 MG/17ML (30 MG/ML) (Elemental Fe): INTRAVENOUS | Qty: 17 | Status: AC

## 2021-04-06 ENCOUNTER — Inpatient Hospital Stay: Payer: Medicare Other

## 2021-04-06 ENCOUNTER — Encounter: Payer: Self-pay | Admitting: Cardiology

## 2021-04-06 ENCOUNTER — Ambulatory Visit (INDEPENDENT_AMBULATORY_CARE_PROVIDER_SITE_OTHER): Payer: Medicare Other | Admitting: Cardiology

## 2021-04-06 ENCOUNTER — Other Ambulatory Visit: Payer: Self-pay

## 2021-04-06 VITALS — BP 136/76 | HR 65 | Ht 67.0 in | Wt 166.0 lb

## 2021-04-06 VITALS — BP 158/67 | HR 58 | Temp 98.1°F | Resp 18 | Ht 67.0 in | Wt 165.0 lb

## 2021-04-06 DIAGNOSIS — C9110 Chronic lymphocytic leukemia of B-cell type not having achieved remission: Secondary | ICD-10-CM | POA: Diagnosis not present

## 2021-04-06 DIAGNOSIS — N189 Chronic kidney disease, unspecified: Secondary | ICD-10-CM

## 2021-04-06 DIAGNOSIS — D649 Anemia, unspecified: Secondary | ICD-10-CM | POA: Diagnosis not present

## 2021-04-06 DIAGNOSIS — I442 Atrioventricular block, complete: Secondary | ICD-10-CM | POA: Diagnosis not present

## 2021-04-06 MED ORDER — SODIUM CHLORIDE 0.9 % IV SOLN
510.0000 mg | Freq: Once | INTRAVENOUS | Status: AC
  Administered 2021-04-06: 510 mg via INTRAVENOUS
  Filled 2021-04-06: qty 17

## 2021-04-06 MED ORDER — SODIUM CHLORIDE 0.9 % IV SOLN: Freq: Once | INTRAVENOUS | Status: AC

## 2021-04-06 NOTE — Progress Notes (Signed)
Electrophysiology Office Note   Date:  04/06/2021   ID:  Michael, Aguirre 05-Nov-1940, MRN [id number]  PCP:  [name], MD  Cardiologist:  Agustin Cree Primary Electrophysiologist:  Lenoir Facchini Meredith Leeds, MD    No chief complaint on file.    History of Present Illness: Michael Aguirre is a 80 y.o. male who is being seen today for the evaluation of pacemaker at the request of [facility]. Presenting today for electrophysiology evaluation.    He has a history significant for transient complete heart block.  He is status post Medtronic dual-chamber pacemaker.  Today, denies symptoms of palpitations, chest pain, shortness of breath, orthopnea, PND, lower extremity edema, claudication, dizziness, presyncope, syncope, bleeding, or neurologic sequela. The patient is tolerating medications without difficulties.  He presents today complaining of twitching at his pacemaker pocket site.  This is been going on for many years.  Is gotten worse over the last few months.  Otherwise he feels well and has no complaints.  Past Medical History:  Diagnosis Date   CLL (chronic lymphocytic leukemia) ([place])    Sciatic nerve pain    Past Surgical History:  Procedure Laterality Date   INSERT / REPLACE / REMOVE PACEMAKER     Medtronic     Current Outpatient Medications  Medication Sig Dispense Refill   ALPRAZolam (XANAX) 0.5 MG tablet Take 1 tablet (0.5 mg total) by mouth 2 (two) times daily as needed for anxiety. 60 tablet 0   AMITIZA 24 MCG capsule Take 24 mcg by mouth 2 (two) times daily.     amLODipine (NORVASC) 10 MG tablet Take 1 tablet (10 mg total) by mouth daily. 30 tablet 6   CALQUENCE 100 MG capsule Take by mouth.     chlorpheniramine-HYDROcodone (TUSSIONEX) 10-8 MG/5ML SUER Take 5 mLs by mouth every 12 (twelve) hours as needed for cough. 140 mL 0   [employer] ANTACID/ANTI-GAS 200-200-20 MG/5ML suspension Take by mouth.     finasteride (PROSCAR) 5 MG tablet Take 5 mg by mouth  daily.  3   fluconazole (DIFLUCAN) 150 MG tablet Take by mouth.     furosemide (LASIX) 20 MG tablet Take 20 mg by mouth as needed.      hydrochlorothiazide (MICROZIDE) 12.5 MG capsule Take 12.5 mg by mouth daily.     levothyroxine (SYNTHROID) 50 MCG tablet Take 50 mcg by mouth daily before breakfast.     lisinopril (ZESTRIL) 40 MG tablet Take 1 tablet by mouth daily.     montelukast (SINGULAIR) 10 MG tablet Take 10 mg by mouth at bedtime.     mupirocin ointment (BACTROBAN) 2 % Apply 1 application topically 2 (two) times daily.     nitroGLYCERIN (NITROSTAT) 0.4 MG SL tablet Place 1 tablet (0.4 mg total) under the tongue every 5 (five) minutes as needed for chest pain. Patient needs an appointment for further refills. 2 nd attempt 15 tablet 0   ondansetron (ZOFRAN) 8 MG tablet Take 8 mg by mouth as needed for nausea or vomiting.     pregabalin (LYRICA) 75 MG capsule Take 75 mg by mouth daily.     tamsulosin (FLOMAX) 0.4 MG CAPS capsule Take 0.8 mg by mouth daily.     No current facility-administered medications for this visit.    Allergies:   Tetracyclines & related, Isosorbide, Tetracycline, and Other   Social History:  The patient  reports that he has never smoked. He has never used smokeless tobacco. He reports that he does not currently  use alcohol. He reports that he does not use drugs.   Family History:  The patient's family history includes Colon cancer in his brother; Hypertension in his father and mother; Throat cancer in his sister.   ROS:  Please see the history of present illness.   Otherwise, review of systems is positive for none.   All other systems are reviewed and negative.   PHYSICAL EXAM: VS:  BP 136/76   Pulse 65   Ht 5\' 7"  (1.702 m)   Wt 166 lb (75.3 kg)   SpO2 97%   BMI 26.00 kg/m  , BMI Body mass index is 26 kg/m. GEN: Well nourished, well developed, in no acute distress  HEENT: normal  Neck: no JVD, carotid bruits, or masses Cardiac: RRR; no murmurs, rubs, or  gallops,no edema  Respiratory:  clear to auscultation bilaterally, normal work of breathing GI: soft, nontender, nondistended, + BS MS: no deformity or atrophy  Skin: warm and dry, device site well healed Neuro:  Strength and sensation are intact Psych: euthymic mood, full affect  EKG:  EKG is ordered today. Personal review of the ekg ordered shows sinus rhythm, rate 65  Personal review of the device interrogation today. Results in [place]: 03/30/2021: Hemoglobin 10.0; Platelets 125    Lipid Panel     Component Value Date/Time   CHOL 217 (H) 05/24/2018 1704   TRIG 217 (H) 05/24/2018 1704   HDL 35 (L) 05/24/2018 1704   CHOLHDL 6.2 (H) 05/24/2018 1704   LDLCALC 139 (H) 05/24/2018 1704     Wt Readings from Last 3 Encounters:  04/06/21 166 lb (75.3 kg)  04/06/21 165 lb (74.8 kg)  03/30/21 164 lb 12 oz (74.7 kg)      Other studies Reviewed: Additional studies/ records that were reviewed today include: TTE 03/29/19 1. Left ventricular ejection fraction, by visual estimation, is 60 to  65%. The left ventricle has normal function. Normal left ventricular size.  Left ventricular septal wall thickness was mildly increased. Mildly  increased left ventricular posterior  wall thickness. There is mildly increased left ventricular hypertrophy.   2. Left ventricular diastolic Doppler parameters are consistent with  pseudonormalization pattern of LV diastolic filling.   3. Global right ventricle has normal systolic function.The right  ventricular size is normal. No increase in right ventricular wall  thickness. Pacer wire seen in the right ventricle.   4. Left atrial size was mildly dilated.   5. Right atrial size was normal.   6. The mitral valve is normal in structure. Mild mitral valve  regurgitation. No evidence of mitral stenosis.   7. The tricuspid valve is normal in structure. Tricuspid valve  regurgitation is trivial.   8. Mild aortic valve annular  calcification.   9. The aortic valve is normal in structure. Aortic valve regurgitation is  trivial by color flow Doppler. Structurally normal aortic valve, with no  evidence of sclerosis or stenosis.  10. The pulmonic valve was normal in structure. Pulmonic valve  regurgitation is not visualized by color flow Doppler.  11. Mildly elevated pulmonary artery systolic pressure.    ASSESSMENT AND PLAN:  1.  Transient third-degree AV block: Status post Medtronic dual-chamber pacemaker.  Device functioning appropriately.  He is having some pocket stimulation.  He is set to unipolar both pacing and sensing.  He has a history of issues with his leads when sets to bipolar.  We have reduced his pacing rate to DDD 40 from DDD  73.  2.  Hypertension: Currently well controlled  Current medicines are reviewed at length with the patient today.   The patient does not have concerns regarding his medicines.  The following changes were made today: None  Labs/ tests ordered today include:  Orders Placed This Encounter  Procedures   EKG 12-Lead      Disposition:   FU with Kelten Enochs 1 year  Signed, Dajiah Kooi Meredith Leeds, MD  04/06/2021 4:08 PM     [address] [place] [address] [phone] (office) [phone] (fax)

## 2021-04-06 NOTE — Patient Instructions (Signed)
Medication Instructions:  °Your physician recommends that you continue on your current medications as directed. Please refer to the Current Medication list given to you today. ° °*If you need a refill on your cardiac medications before your next appointment, please call your pharmacy* ° ° °Lab Work: °None ordered ° ° °Testing/Procedures: °None ordered ° ° °Follow-Up: °At [facility], you and your health needs are our priority.  As part of our continuing mission to provide you with exceptional heart care, we have created designated Provider Care Teams.  These Care Teams include your primary Cardiologist (physician) and Advanced Practice Providers (APPs -  Physician Assistants and Nurse Practitioners) who all work together to provide you with the care you need, when you need it. ° °Your next appointment:   °1 year(s) ° °The format for your next appointment:   °In Person ° °Provider:   °Will Camnitz, MD ° ° ° °Thank you for choosing [facility]!! ° ° °Saliou Barnier, RN °[phone] ° ° ° °

## 2021-04-06 NOTE — Patient Instructions (Signed)
Ferumoxytol Injection What is this medication? FERUMOXYTOL (FER ue MOX i tol) treats low levels of iron in your body (iron deficiency anemia). Iron is a mineral that plays an important role in making red blood cells, which carry oxygen from your lungs to the rest of your body. This medicine may be used for other purposes; ask your health care provider or pharmacist if you have questions. COMMON BRAND NAME(S): Feraheme What should I tell my care team before I take this medication? They need to know if you have any of these conditions: Anemia not caused by low iron levels High levels of iron in the blood Magnetic resonance imaging (MRI) test scheduled An unusual or allergic reaction to iron, other medications, foods, dyes, or preservatives Pregnant or trying to get pregnant Breast-feeding How should I use this medication? This medication is for injection into a vein. It is given in a hospital or clinic setting. Talk to your care team the use of this medication in children. Special care may be needed. Overdosage: If you think you have taken too much of this medicine contact a poison control center or emergency room at once. NOTE: This medicine is only for you. Do not share this medicine with others. What if I miss a dose? It is important not to miss your dose. Call your care team if you are unable to keep an appointment. What may interact with this medication? Other iron products This list may not describe all possible interactions. Give your health care provider a list of all the medicines, herbs, non-prescription drugs, or dietary supplements you use. Also tell them if you smoke, drink alcohol, or use illegal drugs. Some items may interact with your medicine. What should I watch for while using this medication? Visit your care team regularly. Tell your care team if your symptoms do not start to get better or if they get worse. You may need blood work done while you are taking this  medication. You may need to follow a special diet. Talk to your care team. Foods that contain iron include: whole grains/cereals, dried fruits, beans, or peas, leafy green vegetables, and organ meats (liver, kidney). What side effects may I notice from receiving this medication? Side effects that you should report to your care team as soon as possible: Allergic reactions-skin rash, itching, hives, swelling of the face, lips, tongue, or throat Low blood pressure-dizziness, feeling faint or lightheaded, blurry vision Shortness of breath Side effects that usually do not require medical attention (report to your care team if they continue or are bothersome): Flushing Headache Joint pain Muscle pain Nausea Pain, redness, or irritation at injection site This list may not describe all possible side effects. Call your doctor for medical advice about side effects. You may report side effects to FDA at 1-800-FDA-1088. Where should I keep my medication? This medication is given in a hospital or clinic and will not be stored at home. NOTE: This sheet is a summary. It may not cover all possible information. If you have questions about this medicine, talk to your doctor, pharmacist, or health care provider.  2022 Elsevier/Gold Standard (2020-10-10 15:35:12)  

## 2021-04-08 DIAGNOSIS — J351 Hypertrophy of tonsils: Secondary | ICD-10-CM | POA: Diagnosis not present

## 2021-04-08 DIAGNOSIS — N183 Chronic kidney disease, stage 3 unspecified: Secondary | ICD-10-CM | POA: Diagnosis not present

## 2021-04-08 DIAGNOSIS — Z20822 Contact with and (suspected) exposure to covid-19: Secondary | ICD-10-CM | POA: Diagnosis not present

## 2021-04-08 DIAGNOSIS — I129 Hypertensive chronic kidney disease with stage 1 through stage 4 chronic kidney disease, or unspecified chronic kidney disease: Secondary | ICD-10-CM | POA: Diagnosis not present

## 2021-04-08 DIAGNOSIS — C8599 Non-Hodgkin lymphoma, unspecified, extranodal and solid organ sites: Secondary | ICD-10-CM | POA: Diagnosis not present

## 2021-04-08 DIAGNOSIS — Z79899 Other long term (current) drug therapy: Secondary | ICD-10-CM | POA: Diagnosis not present

## 2021-04-08 DIAGNOSIS — J3501 Chronic tonsillitis: Secondary | ICD-10-CM | POA: Diagnosis not present

## 2021-04-08 DIAGNOSIS — E039 Hypothyroidism, unspecified: Secondary | ICD-10-CM | POA: Diagnosis not present

## 2021-04-08 DIAGNOSIS — I442 Atrioventricular block, complete: Secondary | ICD-10-CM | POA: Diagnosis not present

## 2021-04-08 DIAGNOSIS — C9110 Chronic lymphocytic leukemia of B-cell type not having achieved remission: Secondary | ICD-10-CM | POA: Diagnosis not present

## 2021-04-08 DIAGNOSIS — Z950 Presence of cardiac pacemaker: Secondary | ICD-10-CM | POA: Diagnosis not present

## 2021-04-13 ENCOUNTER — Encounter: Payer: Self-pay | Admitting: Hematology and Oncology

## 2021-04-13 MED FILL — Ferumoxytol Inj 510 MG/17ML (30 MG/ML) (Elemental Fe): INTRAVENOUS | Qty: 17 | Status: AC

## 2021-04-13 NOTE — Addendum Note (Signed)
Addended by: Juanetta Beets on: 04/13/2021 10:02 AM   Modules accepted: Orders

## 2021-04-14 ENCOUNTER — Inpatient Hospital Stay: Payer: Medicare Other | Attending: Oncology

## 2021-04-14 ENCOUNTER — Other Ambulatory Visit: Payer: Self-pay

## 2021-04-14 VITALS — BP 133/50 | HR 84 | Temp 98.0°F | Resp 20 | Ht 67.0 in | Wt 163.0 lb

## 2021-04-14 DIAGNOSIS — D631 Anemia in chronic kidney disease: Secondary | ICD-10-CM

## 2021-04-14 DIAGNOSIS — N189 Chronic kidney disease, unspecified: Secondary | ICD-10-CM

## 2021-04-14 DIAGNOSIS — C9110 Chronic lymphocytic leukemia of B-cell type not having achieved remission: Secondary | ICD-10-CM | POA: Insufficient documentation

## 2021-04-14 MED ORDER — SODIUM CHLORIDE 0.9 % IV SOLN
Freq: Once | INTRAVENOUS | Status: AC
Start: 2021-04-14 — End: 2021-04-14

## 2021-04-14 MED ORDER — SODIUM CHLORIDE 0.9 % IV SOLN
510.0000 mg | Freq: Once | INTRAVENOUS | Status: AC
  Administered 2021-04-14: 510 mg via INTRAVENOUS
  Filled 2021-04-14: qty 510

## 2021-04-14 NOTE — Progress Notes (Signed)
Discharged home, stable  

## 2021-04-14 NOTE — Patient Instructions (Signed)
feraFerumoxytol Injection What is this medication? FERUMOXYTOL (FER ue MOX i tol) treats low levels of iron in your body (iron deficiency anemia). Iron is a mineral that plays an important role in making red blood cells, which carry oxygen from your lungs to the rest of your body. This medicine may be used for other purposes; ask your health care provider or pharmacist if you have questions. COMMON BRAND NAME(S): Feraheme What should I tell my care team before I take this medication? They need to know if you have any of these conditions: Anemia not caused by low iron levels High levels of iron in the blood Magnetic resonance imaging (MRI) test scheduled An unusual or allergic reaction to iron, other medications, foods, dyes, or preservatives Pregnant or trying to get pregnant Breast-feeding How should I use this medication? This medication is for injection into a vein. It is given in a hospital or clinic setting. Talk to your care team the use of this medication in children. Special care may be needed. Overdosage: If you think you have taken too much of this medicine contact a poison control center or emergency room at once. NOTE: This medicine is only for you. Do not share this medicine with others. What if I miss a dose? It is important not to miss your dose. Call your care team if you are unable to keep an appointment. What may interact with this medication? Other iron products This list may not describe all possible interactions. Give your health care provider a list of all the medicines, herbs, non-prescription drugs, or dietary supplements you use. Also tell them if you smoke, drink alcohol, or use illegal drugs. Some items may interact with your medicine. What should I watch for while using this medication? Visit your care team regularly. Tell your care team if your symptoms do not start to get better or if they get worse. You may need blood work done while you are taking this  medication. You may need to follow a special diet. Talk to your care team. Foods that contain iron include: whole grains/cereals, dried fruits, beans, or peas, leafy green vegetables, and organ meats (liver, kidney). What side effects may I notice from receiving this medication? Side effects that you should report to your care team as soon as possible: Allergic reactions--skin rash, itching, hives, swelling of the face, lips, tongue, or throat Low blood pressure--dizziness, feeling faint or lightheaded, blurry vision Shortness of breath Side effects that usually do not require medical attention (report to your care team if they continue or are bothersome): Flushing Headache Joint pain Muscle pain Nausea Pain, redness, or irritation at injection site This list may not describe all possible side effects. Call your doctor for medical advice about side effects. You may report side effects to FDA at 1-800-FDA-1088. Where should I keep my medication? This medication is given in a hospital or clinic and will not be stored at home. NOTE: This sheet is a summary. It may not cover all possible information. If you have questions about this medicine, talk to your doctor, pharmacist, or health care provider.  2022 Elsevier/Gold Standard (2020-10-17 00:00:00)

## 2021-04-23 NOTE — Progress Notes (Signed)
[place]  [address] [place],  [place]  [address] [phone]  Clinic Day:  05/04/2021  Referring physician: [name], MD  This document serves as a record of services personally performed by Tanyla Stege Macarthur Critchley, MD. It was created on their behalf by [facility] E, a trained medical scribe. The creation of this record is based on the scribe's personal observations and the provider's statements to them.  HISTORY OF PRESENT ILLNESS:  The patient is an 80 y.o. 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  Due to his disease leading to significant anemia, he was placed on acalabrutinib 100 mg twice daily in late July 2022.  He comes in today to reassess his peripheral counts.  Since his last visit, the patient has been doing okay.  He claims his fatigue is getting better.  He has been receiving monthly Retacrit injections for his hemoglobin being less than 10.   Of note, he did undergo a tonsillectomy at [facility] earlier this month.  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  Since his tonsillectomy, the patient has been doing fine.  He believes his posterior tongue feels thicker and believes white spots are on it as well.   He denies having any B symptoms or bulky lymphadenopathy which concerns him for catabolic disease progression while on acalabrutinib.    VITALS:  Blood pressure (!) 153/61, pulse 65, temperature 98.1 F (36.7 C), resp. rate 16, height 5\' 7"  (1.702 m), weight 160 lb 12.8 oz (72.9 kg), SpO2 98 %.  Wt Readings from Last 3 Encounters:  05/04/21 160 lb 12.8 oz (72.9 kg)  04/14/21 163 lb 0.6 oz (74 kg)  04/06/21 166 lb (75.3 kg)    Body mass index is 25.18 kg/m.  Performance status (ECOG): 2 - Symptomatic, <50% confined to bed  PHYSICAL EXAM:  Physical Exam Constitutional:      General: He is not in  acute distress.    Appearance: Normal appearance. He is normal weight.  HENT:     Head: Normocephalic and atraumatic.     Mouth/Throat:     Mouth: Mucous membranes are moist. No oral lesions.     Tongue: No lesions.     Comments: Tonsillectomy regions are well healed  Eyes:     General: No scleral icterus.    Extraocular Movements: Extraocular movements intact.     Conjunctiva/sclera: Conjunctivae normal.     Pupils: Pupils are equal, round, and reactive to light.  Cardiovascular:     Rate and Rhythm: Normal rate and regular rhythm.     Pulses: Normal pulses.     Heart sounds: Normal heart sounds. No murmur heard.   No friction rub. No gallop.  Pulmonary:     Effort: Pulmonary effort is normal. No respiratory distress.     Breath sounds: Normal breath sounds.  Abdominal:     General: Bowel sounds are normal. There is no distension.     Palpations: Abdomen is soft. There is no hepatomegaly, splenomegaly or mass.     Tenderness: There is no abdominal tenderness.  Musculoskeletal:        General: Normal range of motion.     Cervical back: Normal range of motion and neck supple.     Right lower leg: No edema.     Left lower leg: No edema.  Lymphadenopathy:     Cervical: No  cervical adenopathy.  Skin:    General: Skin is warm and dry.  Neurological:     General: No focal deficit present.     Mental Status: He is alert and oriented to person, place, and time. Mental status is at baseline.  Psychiatric:        Mood and Affect: Mood normal.        Behavior: Behavior normal.        Thought Content: Thought content normal.        Judgment: Judgment normal.    LABS:    Latest Reference Range & Units 05/04/21 00:00  WBC  43.1  RBC 3.87 - 5.11  3.41 !  Hemoglobin 13.5 - 17.5  10.3 !  HCT 41 - 53  32 !  Platelets 150 - 399  129 !  LYMPH%  95 !  LYMPH#  40.95 !  NEUT#  1.72    ASSESSMENT & PLAN:  Assessment/Plan:  An 80 year old gentleman with chronic lymphocytic leukemia.   His labs today show how that his white count continues to fall.  Furthermore, his hemoglobin is now above 10.  These findings reflect the continued benefits from his acalabrutinib therapy.  As his hemoglobin is above 10, Retacrit is not necessary.  I reassured him that his tonsillar region is well healed from his recent surgery; furthermore, he has no abnormal tongue lesions.  Clinically, he is doing better.  I will see him back in 2 months for repeat clinical assessment.  The patient understands all the plans discussed today and is in agreement with them.    I, [name], am acting as scribe for Marice Potter, MD    I have reviewed this report as typed by the medical scribe, and it is complete and accurate.  Leen Tworek Macarthur Critchley, MD

## 2021-04-24 ENCOUNTER — Ambulatory Visit: Payer: Medicare Other | Admitting: Oncology

## 2021-04-24 ENCOUNTER — Other Ambulatory Visit: Payer: Medicare Other

## 2021-04-27 ENCOUNTER — Other Ambulatory Visit: Payer: Self-pay | Admitting: Pharmacist

## 2021-04-29 ENCOUNTER — Other Ambulatory Visit: Payer: Self-pay | Admitting: Hematology and Oncology

## 2021-04-29 DIAGNOSIS — F411 Generalized anxiety disorder: Secondary | ICD-10-CM

## 2021-05-04 ENCOUNTER — Inpatient Hospital Stay: Payer: Medicare Other

## 2021-05-04 ENCOUNTER — Other Ambulatory Visit: Payer: Self-pay

## 2021-05-04 ENCOUNTER — Other Ambulatory Visit: Payer: Self-pay | Admitting: Oncology

## 2021-05-04 ENCOUNTER — Encounter: Payer: Self-pay | Admitting: Oncology

## 2021-05-04 ENCOUNTER — Inpatient Hospital Stay (INDEPENDENT_AMBULATORY_CARE_PROVIDER_SITE_OTHER): Payer: Medicare Other | Admitting: Oncology

## 2021-05-04 VITALS — BP 153/61 | HR 65 | Temp 98.1°F | Resp 16 | Ht 67.0 in | Wt 160.8 lb

## 2021-05-04 DIAGNOSIS — C9110 Chronic lymphocytic leukemia of B-cell type not having achieved remission: Secondary | ICD-10-CM

## 2021-05-04 LAB — CBC AND DIFFERENTIAL
HCT: 32 — AB (ref 41–53)
Hemoglobin: 10.3 — AB (ref 13.5–17.5)
Neutrophils Absolute: 1.72
Platelets: 129 — AB (ref 150–399)
WBC: 43.1

## 2021-05-04 LAB — CBC: RBC: 3.41 — AB (ref 3.87–5.11)

## 2021-05-08 DIAGNOSIS — C44212 Basal cell carcinoma of skin of right ear and external auricular canal: Secondary | ICD-10-CM | POA: Diagnosis not present

## 2021-05-08 DIAGNOSIS — L570 Actinic keratosis: Secondary | ICD-10-CM | POA: Diagnosis not present

## 2021-05-12 DIAGNOSIS — R053 Chronic cough: Secondary | ICD-10-CM | POA: Diagnosis not present

## 2021-05-12 DIAGNOSIS — E039 Hypothyroidism, unspecified: Secondary | ICD-10-CM | POA: Diagnosis not present

## 2021-05-12 DIAGNOSIS — C9110 Chronic lymphocytic leukemia of B-cell type not having achieved remission: Secondary | ICD-10-CM | POA: Diagnosis not present

## 2021-05-12 DIAGNOSIS — Z6824 Body mass index (BMI) 24.0-24.9, adult: Secondary | ICD-10-CM | POA: Diagnosis not present

## 2021-05-12 DIAGNOSIS — Z9181 History of falling: Secondary | ICD-10-CM | POA: Diagnosis not present

## 2021-05-12 DIAGNOSIS — Z79899 Other long term (current) drug therapy: Secondary | ICD-10-CM | POA: Diagnosis not present

## 2021-05-12 DIAGNOSIS — I10 Essential (primary) hypertension: Secondary | ICD-10-CM | POA: Diagnosis not present

## 2021-05-12 DIAGNOSIS — Z0000 Encounter for general adult medical examination without abnormal findings: Secondary | ICD-10-CM | POA: Diagnosis not present

## 2021-05-26 ENCOUNTER — Encounter: Payer: Self-pay | Admitting: Hematology and Oncology

## 2021-06-02 ENCOUNTER — Other Ambulatory Visit: Payer: Self-pay

## 2021-06-02 ENCOUNTER — Inpatient Hospital Stay: Payer: Medicare Other | Attending: Oncology

## 2021-06-02 ENCOUNTER — Inpatient Hospital Stay: Payer: Medicare Other

## 2021-06-02 ENCOUNTER — Other Ambulatory Visit: Payer: Self-pay | Admitting: Hematology and Oncology

## 2021-06-02 VITALS — BP 159/57 | HR 55 | Temp 98.4°F | Resp 18 | Ht 67.0 in | Wt 163.8 lb

## 2021-06-02 DIAGNOSIS — C9110 Chronic lymphocytic leukemia of B-cell type not having achieved remission: Secondary | ICD-10-CM | POA: Insufficient documentation

## 2021-06-02 DIAGNOSIS — N189 Chronic kidney disease, unspecified: Secondary | ICD-10-CM

## 2021-06-02 DIAGNOSIS — D630 Anemia in neoplastic disease: Secondary | ICD-10-CM | POA: Insufficient documentation

## 2021-06-02 LAB — CBC AND DIFFERENTIAL
HCT: 31 — AB (ref 41–53)
Hemoglobin: 10 — AB (ref 13.5–17.5)
Neutrophils Absolute: 2.97
Platelets: 140 — AB (ref 150–399)
WBC: 42.4

## 2021-06-02 LAB — CBC: RBC: 3.23 — AB (ref 3.87–5.11)

## 2021-06-02 MED ORDER — EPOETIN ALFA-EPBX 40000 UNIT/ML IJ SOLN
40000.0000 [IU] | Freq: Once | INTRAMUSCULAR | Status: AC
  Administered 2021-06-02: 40000 [IU] via SUBCUTANEOUS
  Filled 2021-06-02: qty 1

## 2021-06-02 NOTE — Patient Instructions (Signed)
Epoetin Alfa injection °What is this medication? °EPOETIN ALFA (e POE e tin AL fa) helps your body make more red blood cells. This medicine is used to treat anemia caused by chronic kidney disease, cancer chemotherapy, or HIV-therapy. It may also be used before surgery if you have anemia. °This medicine may be used for other purposes; ask your health care provider or pharmacist if you have questions. °COMMON BRAND NAME(S): Epogen, Procrit, Retacrit °What should I tell my care team before I take this medication? °They need to know if you have any of these conditions: °cancer °heart disease °high blood pressure °history of blood clots °history of stroke °low levels of folate, iron, or vitamin B12 in the blood °seizures °an unusual or allergic reaction to erythropoietin, albumin, benzyl alcohol, hamster proteins, other medicines, foods, dyes, or preservatives °pregnant or trying to get pregnant °breast-feeding °How should I use this medication? °This medicine is for injection into a vein or under the skin. It is usually given by a health care professional in a hospital or clinic setting. °If you get this medicine at home, you will be taught how to prepare and give this medicine. Use exactly as directed. Take your medicine at regular intervals. Do not take your medicine more often than directed. °It is important that you put your used needles and syringes in a special sharps container. Do not put them in a trash can. If you do not have a sharps container, call your pharmacist or healthcare provider to get one. °A special MedGuide will be given to you by the pharmacist with each prescription and refill. Be sure to read this information carefully each time. °Talk to your pediatrician regarding the use of this medicine in children. While this drug may be prescribed for selected conditions, precautions do apply. °Overdosage: If you think you have taken too much of this medicine contact a poison control center or emergency  room at once. °NOTE: This medicine is only for you. Do not share this medicine with others. °What if I miss a dose? °If you miss a dose, take it as soon as you can. If it is almost time for your next dose, take only that dose. Do not take double or extra doses. °What may interact with this medication? °Interactions have not been studied. °This list may not describe all possible interactions. Give your health care provider a list of all the medicines, herbs, non-prescription drugs, or dietary supplements you use. Also tell them if you smoke, drink alcohol, or use illegal drugs. Some items may interact with your medicine. °What should I watch for while using this medication? °Your condition will be monitored carefully while you are receiving this medicine. °You may need blood work done while you are taking this medicine. °This medicine may cause a decrease in vitamin B6. You should make sure that you get enough vitamin B6 while you are taking this medicine. Discuss the foods you eat and the vitamins you take with your health care professional. °What side effects may I notice from receiving this medication? °Side effects that you should report to your doctor or health care professional as soon as possible: °allergic reactions like skin rash, itching or hives, swelling of the face, lips, or tongue °seizures °signs and symptoms of a blood clot such as breathing problems; changes in vision; chest pain; severe, sudden headache; pain, swelling, warmth in the leg; trouble speaking; sudden numbness or weakness of the face, arm or leg °signs and symptoms of a stroke like   changes in vision; confusion; trouble speaking or understanding; severe headaches; sudden numbness or weakness of the face, arm or leg; trouble walking; dizziness; loss of balance or coordination °Side effects that usually do not require medical attention (report to your doctor or health care professional if they continue or are  bothersome): °chills °cough °dizziness °fever °headaches °joint pain °muscle cramps °muscle pain °nausea, vomiting °pain, redness, or irritation at site where injected °This list may not describe all possible side effects. Call your doctor for medical advice about side effects. You may report side effects to FDA at 1-800-FDA-1088. °Where should I keep my medication? °Keep out of the reach of children. °Store in a refrigerator between 2 and 8 degrees C (36 and 46 degrees F). Do not freeze or shake. Throw away any unused portion if using a single-dose vial. Multi-dose vials can be kept in the refrigerator for up to 21 days after the initial dose. Throw away unused medicine. °NOTE: This sheet is a summary. It may not cover all possible information. If you have questions about this medicine, talk to your doctor, pharmacist, or health care provider. °© 2022 [employer]vier/Gold Standard (2017-01-25 00:00:00) ° °

## 2021-06-02 NOTE — Progress Notes (Signed)
1525:PT STABLE AT TIME OF DISCHARGE

## 2021-06-04 ENCOUNTER — Other Ambulatory Visit: Payer: Self-pay | Admitting: Hematology and Oncology

## 2021-06-04 DIAGNOSIS — F411 Generalized anxiety disorder: Secondary | ICD-10-CM

## 2021-06-05 ENCOUNTER — Encounter: Payer: Self-pay | Admitting: Hematology and Oncology

## 2021-06-07 ENCOUNTER — Other Ambulatory Visit: Payer: Self-pay | Admitting: Hematology and Oncology

## 2021-06-07 DIAGNOSIS — F411 Generalized anxiety disorder: Secondary | ICD-10-CM

## 2021-06-09 ENCOUNTER — Encounter: Payer: Self-pay | Admitting: Hematology and Oncology

## 2021-06-17 ENCOUNTER — Ambulatory Visit (INDEPENDENT_AMBULATORY_CARE_PROVIDER_SITE_OTHER): Payer: Medicare Other

## 2021-06-17 DIAGNOSIS — I442 Atrioventricular block, complete: Secondary | ICD-10-CM | POA: Diagnosis not present

## 2021-06-19 LAB — CUP PACEART REMOTE DEVICE CHECK
Battery Impedance: 994 Ohm
Battery Remaining Longevity: 82 mo
Battery Voltage: 2.78 V
Brady Statistic AP VP Percent: 3 %
Brady Statistic AP VS Percent: 1 %
Brady Statistic AS VP Percent: 1 %
Brady Statistic AS VS Percent: 95 %
Date Time Interrogation Session: 20230112162817
Implantable Lead Implant Date: 20020322
Implantable Lead Implant Date: 20030322
Implantable Lead Location: 753859
Implantable Lead Location: 753860
Implantable Lead Model: 5092
Implantable Lead Model: 5592
Implantable Pulse Generator Implant Date: 20110526
Lead Channel Impedance Value: 378 Ohm
Lead Channel Impedance Value: 398 Ohm
Lead Channel Pacing Threshold Amplitude: 0.625 V
Lead Channel Pacing Threshold Amplitude: 0.75 V
Lead Channel Pacing Threshold Pulse Width: 0.4 ms
Lead Channel Pacing Threshold Pulse Width: 0.4 ms
Lead Channel Setting Pacing Amplitude: 1.75 V
Lead Channel Setting Pacing Amplitude: 2 V
Lead Channel Setting Pacing Pulse Width: 0.4 ms
Lead Channel Setting Sensing Sensitivity: 2 mV

## 2021-06-29 NOTE — Progress Notes (Signed)
Remote pacemaker transmission.   

## 2021-06-29 NOTE — Progress Notes (Signed)
[facility]  [address] [place],  [place]  [address] [phone]  Clinic Day:  07/03/2021  Referring physician: [name], MD  This document serves as a record of services personally performed by Anhar Mcdermott Macarthur Critchley, MD. It was created on their behalf by [facility] E, a trained medical scribe. The creation of this record is based on the scribe's personal observations and the provider's statements to them.  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  Due to his disease leading to significant anemia, he was placed on acalabrutinib 100 mg twice daily in late July 2022.  He comes in today to reassess his peripheral counts.  Since his last visit, the patient has been doing okay.  He claims his fatigue is getting better, as is his overall daily quality of life.  He denies having any B symptoms or bulky lymphadenopathy which concerns him for catabolic disease progression while on acalabrutinib.    VITALS:  Blood pressure 138/75, pulse (!) 58, temperature 98.2 F (36.8 C), resp. rate 16, height 5\' 7"  (1.702 m), weight 167 lb 1.6 oz (75.8 kg), SpO2 97 %.  Wt Readings from Last 3 Encounters:  07/03/21 167 lb 1.6 oz (75.8 kg)  06/02/21 163 lb 12 oz (74.3 kg)  05/04/21 160 lb 12.8 oz (72.9 kg)    Body mass index is 26.17 kg/m.  Performance status (ECOG): 2 - Symptomatic, <50% confined to bed  PHYSICAL EXAM:  Physical Exam Constitutional:      General: He is not in acute distress.    Appearance: Normal appearance. He is normal weight.  HENT:     Head: Normocephalic and atraumatic.  Eyes:     General: No scleral icterus.    Extraocular Movements: Extraocular movements intact.     Conjunctiva/sclera: Conjunctivae normal.     Pupils: Pupils are equal, round, and reactive to light.  Cardiovascular:     Rate and Rhythm: Regular  rhythm. Bradycardia present.     Pulses: Normal pulses.     Heart sounds: Normal heart sounds. No murmur heard.   No friction rub. No gallop.  Pulmonary:     Effort: Pulmonary effort is normal. No respiratory distress.     Breath sounds: Normal breath sounds.  Abdominal:     General: Bowel sounds are normal. There is no distension.     Palpations: Abdomen is soft. There is no hepatomegaly, splenomegaly or mass.     Tenderness: There is no abdominal tenderness.  Musculoskeletal:        General: Normal range of motion.     Cervical back: Normal range of motion and neck supple.     Right lower leg: No edema.     Left lower leg: No edema.  Lymphadenopathy:     Cervical: No cervical adenopathy.  Skin:    General: Skin is warm and dry.  Neurological:     General: No focal deficit present.     Mental Status: He is alert and oriented to person, place, and time. Mental status is at baseline.  Psychiatric:        Mood and Affect: Mood normal.        Behavior: Behavior normal.        Thought Content: Thought content normal.        Judgment: Judgment normal.    LABS:  Latest Reference Range & Units 07/03/21 00:00  WBC  45.7 (E)  RBC 3.87 - 5.11  3.49 ! (E)  Hemoglobin 13.5 - 17.5  11.3 ! (E)  HCT 41 - 53  34 ! (E)  Platelets 150 - 399  120 ! (E)  NEUT#  3.20 (E)    ASSESSMENT & PLAN:  Assessment/Plan:  An 81 year old gentleman with chronic lymphocytic leukemia.  His labs today show how that his white count is essentially stable.  I am pleased as his hemoglobin continues to rise.  These findings reflect the continued benefits from his acalabrutinib therapy.  As his hemoglobin remains above 10, Retacrit is not necessary.  Clinically, he is doing better.  I will see him back in 4 months for repeat clinical assessment.  The patient understands all the plans discussed today and is in agreement with them.    I, [name], am acting as scribe for Marice Potter, MD    I have  reviewed this report as typed by the medical scribe, and it is complete and accurate.  Michael Sia Macarthur Critchley, MD

## 2021-07-03 ENCOUNTER — Inpatient Hospital Stay: Payer: Medicare Other

## 2021-07-03 ENCOUNTER — Other Ambulatory Visit: Payer: Self-pay | Admitting: Oncology

## 2021-07-03 ENCOUNTER — Other Ambulatory Visit: Payer: Self-pay

## 2021-07-03 ENCOUNTER — Inpatient Hospital Stay: Payer: Medicare Other | Attending: Oncology | Admitting: Oncology

## 2021-07-03 ENCOUNTER — Other Ambulatory Visit: Payer: Self-pay | Admitting: Hematology and Oncology

## 2021-07-03 ENCOUNTER — Telehealth: Payer: Self-pay | Admitting: Oncology

## 2021-07-03 VITALS — BP 138/75 | HR 58 | Temp 98.2°F | Resp 16 | Ht 67.0 in | Wt 167.1 lb

## 2021-07-03 DIAGNOSIS — C9110 Chronic lymphocytic leukemia of B-cell type not having achieved remission: Secondary | ICD-10-CM

## 2021-07-03 LAB — CBC AND DIFFERENTIAL
HCT: 34 — AB (ref 41–53)
Hemoglobin: 11.3 — AB (ref 13.5–17.5)
Neutrophils Absolute: 3.2
Platelets: 120 — AB (ref 150–399)
WBC: 45.7

## 2021-07-03 LAB — CBC: RBC: 3.49 — AB (ref 3.87–5.11)

## 2021-07-03 NOTE — Telephone Encounter (Signed)
Patient has been scheduled for follow-up visit per 07/03/21 los. Pt given an appt calendar with date and time. ° ° °

## 2021-07-27 ENCOUNTER — Other Ambulatory Visit: Payer: Self-pay | Admitting: Hematology and Oncology

## 2021-07-27 DIAGNOSIS — F411 Generalized anxiety disorder: Secondary | ICD-10-CM

## 2021-07-31 ENCOUNTER — Other Ambulatory Visit: Payer: Self-pay | Admitting: Hematology and Oncology

## 2021-07-31 DIAGNOSIS — C9110 Chronic lymphocytic leukemia of B-cell type not having achieved remission: Secondary | ICD-10-CM

## 2021-08-04 DIAGNOSIS — C9110 Chronic lymphocytic leukemia of B-cell type not having achieved remission: Secondary | ICD-10-CM | POA: Diagnosis not present

## 2021-08-04 DIAGNOSIS — E039 Hypothyroidism, unspecified: Secondary | ICD-10-CM | POA: Diagnosis not present

## 2021-08-07 DIAGNOSIS — C9110 Chronic lymphocytic leukemia of B-cell type not having achieved remission: Secondary | ICD-10-CM | POA: Diagnosis not present

## 2021-08-07 DIAGNOSIS — R58 Hemorrhage, not elsewhere classified: Secondary | ICD-10-CM | POA: Diagnosis not present

## 2021-08-10 ENCOUNTER — Telehealth: Payer: Self-pay

## 2021-08-10 NOTE — Telephone Encounter (Signed)
I spoke with pt's wife. I told her that Dr Bobby Rumpf recommends he see his PCP for f/u of the knots on hand and penis. Pt's wife said they saw Dr Lin Landsman (Dr Humphrey Rolls is PCP), but he didn't order anything. He just told them he would send note to Dr Bobby Rumpf. They also called Dr Ara Kussmaul office on Friday and were told to go to ER. Michael Aguirre would not go to emergency room for that. I encouraged her to call and for ask for appt with Dr Humphrey Rolls. She replied, I will. ? ? ? ?Ive discussed pt's symptoms with Dr Bobby Rumpf. He doesn't believe the knots are side effects from the [place]. He recommends pt see his PCP. ? ?RE: Side effects of Calquence? ?Received: Today ?Melodye Ped, NP  Dairl Ponder, RN ?If Michael Aguirre's leave starts this week, I won't be able to see him until Friday. Does Dr. Bobby Rumpf have any openings or thoughts?   ?  ?   ?Previous Messages ? ? ? ?Pt called to report that he has some issues that have to be taken care of. He has had knots come up on his left hand. He has a knot/sore on the inside of his mouth @ denture line. He has been using the MMW, but not helping.  He stopped Calquence yesterday because he also "has knot on my penis and that has to be taken care of". Afebrile. No bruising. No N/V. I sent above message to [facility] P,NP. ?

## 2021-09-03 DIAGNOSIS — M5432 Sciatica, left side: Secondary | ICD-10-CM | POA: Diagnosis not present

## 2021-09-03 DIAGNOSIS — M79672 Pain in left foot: Secondary | ICD-10-CM | POA: Diagnosis not present

## 2021-09-03 DIAGNOSIS — M722 Plantar fascial fibromatosis: Secondary | ICD-10-CM | POA: Diagnosis not present

## 2021-09-16 ENCOUNTER — Ambulatory Visit (INDEPENDENT_AMBULATORY_CARE_PROVIDER_SITE_OTHER): Payer: Medicare Other

## 2021-09-16 DIAGNOSIS — I442 Atrioventricular block, complete: Secondary | ICD-10-CM

## 2021-09-16 LAB — CUP PACEART REMOTE DEVICE CHECK
Battery Impedance: 1129 Ohm
Battery Remaining Longevity: 76 mo
Battery Voltage: 2.77 V
Brady Statistic AP VP Percent: 3 %
Brady Statistic AP VS Percent: 1 %
Brady Statistic AS VP Percent: 1 %
Brady Statistic AS VS Percent: 95 %
Date Time Interrogation Session: 20230412095632
Implantable Lead Implant Date: 20020322
Implantable Lead Implant Date: 20030322
Implantable Lead Location: 753859
Implantable Lead Location: 753860
Implantable Lead Model: 5092
Implantable Lead Model: 5592
Implantable Pulse Generator Implant Date: 20110526
Lead Channel Impedance Value: 378 Ohm
Lead Channel Impedance Value: 379 Ohm
Lead Channel Pacing Threshold Amplitude: 0.625 V
Lead Channel Pacing Threshold Amplitude: 0.75 V
Lead Channel Pacing Threshold Pulse Width: 0.4 ms
Lead Channel Pacing Threshold Pulse Width: 0.4 ms
Lead Channel Setting Pacing Amplitude: 1.5 V
Lead Channel Setting Pacing Amplitude: 2 V
Lead Channel Setting Pacing Pulse Width: 0.4 ms
Lead Channel Setting Sensing Sensitivity: 2 mV

## 2021-09-26 ENCOUNTER — Other Ambulatory Visit: Payer: Self-pay | Admitting: Hematology and Oncology

## 2021-09-26 DIAGNOSIS — F411 Generalized anxiety disorder: Secondary | ICD-10-CM

## 2021-09-28 ENCOUNTER — Encounter: Payer: Self-pay | Admitting: Hematology and Oncology

## 2021-09-29 ENCOUNTER — Other Ambulatory Visit: Payer: Self-pay | Admitting: Hematology and Oncology

## 2021-09-29 DIAGNOSIS — F411 Generalized anxiety disorder: Secondary | ICD-10-CM

## 2021-09-30 ENCOUNTER — Encounter: Payer: Self-pay | Admitting: Hematology and Oncology

## 2021-10-02 NOTE — Progress Notes (Signed)
Remote pacemaker transmission.   

## 2021-10-21 ENCOUNTER — Telehealth: Payer: Self-pay

## 2021-10-21 NOTE — Telephone Encounter (Addendum)
Pt notified of below and verbalized understanding. ? ? ?RE: Asking if need to hold Calquence for dermatology appt ?Received: Today ?Melodye Ped, NP  Dairl Ponder, RN ?I do not think he needs to hold his meds.   ?  ?   ? ? ?Pt's wife called to ask if they need to hold his Calquence before his dermatology appt? The dermatologist has some areas ( area on face, scalp, and side of head) he is going to remove that are skin cancers? Message sent to [facility] P,NP, @ [id number]-awc ?

## 2021-10-24 ENCOUNTER — Other Ambulatory Visit: Payer: Self-pay | Admitting: Hematology and Oncology

## 2021-10-24 DIAGNOSIS — F411 Generalized anxiety disorder: Secondary | ICD-10-CM

## 2021-10-26 ENCOUNTER — Encounter: Payer: Self-pay | Admitting: Hematology and Oncology

## 2021-10-29 DIAGNOSIS — L578 Other skin changes due to chronic exposure to nonionizing radiation: Secondary | ICD-10-CM | POA: Diagnosis not present

## 2021-10-29 DIAGNOSIS — L570 Actinic keratosis: Secondary | ICD-10-CM | POA: Diagnosis not present

## 2021-10-29 DIAGNOSIS — L814 Other melanin hyperpigmentation: Secondary | ICD-10-CM | POA: Diagnosis not present

## 2021-10-29 DIAGNOSIS — C44219 Basal cell carcinoma of skin of left ear and external auricular canal: Secondary | ICD-10-CM | POA: Diagnosis not present

## 2021-11-02 NOTE — Progress Notes (Signed)
[place]  [address] [place],  [place]  [address] [phone]  Clinic Day:  11/03/2021  Referring physician: [name], MD  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  Due to his disease leading to significant anemia, he was placed on acalabrutinib 100 mg twice daily in late July 2022.  He comes in today to reassess his peripheral counts.  Since his last visit, the patient has been doing okay.  He claims his fatigue is getting better, as is his overall daily quality of life.  He denies having any B symptoms or bulky lymphadenopathy which concerns him for catabolic disease progression while on acalabrutinib.    VITALS:  Blood pressure (!) 183/76, pulse (!) 59, temperature 97.9 F (36.6 C), resp. rate 16, height '5\' 7"'$  (1.702 m), weight 177 lb 11.2 oz (80.6 kg), SpO2 97 %.  Wt Readings from Last 3 Encounters:  11/03/21 177 lb 11.2 oz (80.6 kg)  07/03/21 167 lb 1.6 oz (75.8 kg)  06/02/21 163 lb 12 oz (74.3 kg)    Body mass index is 27.83 kg/m.  Performance status (ECOG): 2 - Symptomatic, <50% confined to bed  PHYSICAL EXAM:  Physical Exam Constitutional:      General: He is not in acute distress.    Appearance: Normal appearance. He is normal weight.  HENT:     Head: Normocephalic and atraumatic.  Eyes:     General: No scleral icterus.    Extraocular Movements: Extraocular movements intact.     Conjunctiva/sclera: Conjunctivae normal.     Pupils: Pupils are equal, round, and reactive to light.  Cardiovascular:     Rate and Rhythm: Regular rhythm. Bradycardia present.     Pulses: Normal pulses.     Heart sounds: Normal heart sounds. No murmur heard.   No friction rub. No gallop.  Pulmonary:     Effort: Pulmonary effort is normal. No respiratory distress.     Breath sounds: Normal breath sounds.  Abdominal:      General: Bowel sounds are normal. There is no distension.     Palpations: Abdomen is soft. There is no hepatomegaly, splenomegaly or mass.     Tenderness: There is no abdominal tenderness.  Musculoskeletal:        General: Normal range of motion.     Cervical back: Normal range of motion and neck supple.     Right lower leg: No edema.     Left lower leg: No edema.  Lymphadenopathy:     Cervical: No cervical adenopathy.  Skin:    General: Skin is warm and dry.  Neurological:     General: No focal deficit present.     Mental Status: He is alert and oriented to person, place, and time. Mental status is at baseline.  Psychiatric:        Mood and Affect: Mood normal.        Behavior: Behavior normal.        Thought Content: Thought content normal.        Judgment: Judgment normal.    LABS:    ASSESSMENT & PLAN:  Assessment/Plan:  An 81 year old gentleman with chronic lymphocytic leukemia.  I am pleased as his white count has fallen since his last visit.  Although slightly lower, his hemoglobin remains above 10.  There is nothing per his labs or physical exam  which concerns me for progression of his CLL.  He knows to continue taking his acalabrutinib at 100 mg twice daily.  Clinically, he is doing well.  I will see him back in 4 months for repeat clinical assessment.  The patient understands all the plans discussed today and is in agreement with them.    Pretty Weltman Macarthur Critchley, MD

## 2021-11-03 ENCOUNTER — Inpatient Hospital Stay: Payer: Medicare Other | Admitting: Oncology

## 2021-11-03 ENCOUNTER — Other Ambulatory Visit: Payer: Self-pay | Admitting: Oncology

## 2021-11-03 ENCOUNTER — Inpatient Hospital Stay: Payer: Medicare Other | Attending: Oncology

## 2021-11-03 VITALS — BP 183/76 | HR 59 | Temp 97.9°F | Resp 16 | Ht 67.0 in | Wt 177.7 lb

## 2021-11-03 DIAGNOSIS — C9110 Chronic lymphocytic leukemia of B-cell type not having achieved remission: Secondary | ICD-10-CM

## 2021-11-03 LAB — CBC AND DIFFERENTIAL
HCT: 33 — AB (ref 41–53)
Hemoglobin: 10.5 — AB (ref 13.5–17.5)
Neutrophils Absolute: 3.09
Platelets: 141 10*3/uL — AB (ref 150–400)
WBC: 34.3

## 2021-11-03 LAB — CBC: RBC: 3.34 — AB (ref 3.87–5.11)

## 2021-11-24 DIAGNOSIS — C44219 Basal cell carcinoma of skin of left ear and external auricular canal: Secondary | ICD-10-CM | POA: Diagnosis not present

## 2021-12-09 NOTE — Progress Notes (Signed)
Patient called in, his co-pay assistance for Calqence through [facility] is ending 12/22/2021. There are no open co-pay funds at this time, they will be in tomorrow to sign for free medication.

## 2021-12-10 NOTE — Progress Notes (Signed)
Sent in paperwork for free Calquence to [employer].

## 2021-12-15 NOTE — Progress Notes (Addendum)
Renewed patients co-pay assistance through [facility], funding opened up he will no longer need free medication. PANF Member ID: [id number] Group ID: [id number] PCN: [id number] Eligibility Start date: 12/23/2021 Eligibility End Date: 12/23/2022 Amount: [phone]  Called and provided the information to [employer].

## 2021-12-16 ENCOUNTER — Ambulatory Visit (INDEPENDENT_AMBULATORY_CARE_PROVIDER_SITE_OTHER): Payer: Medicare Other

## 2021-12-16 DIAGNOSIS — I442 Atrioventricular block, complete: Secondary | ICD-10-CM | POA: Diagnosis not present

## 2021-12-18 LAB — CUP PACEART REMOTE DEVICE CHECK
Battery Impedance: 1154 Ohm
Battery Remaining Longevity: 74 mo
Battery Voltage: 2.78 V
Brady Statistic AP VP Percent: 3 %
Brady Statistic AP VS Percent: 0 %
Brady Statistic AS VP Percent: 1 %
Brady Statistic AS VS Percent: 96 %
Date Time Interrogation Session: 20230713183858
Implantable Lead Implant Date: 20020322
Implantable Lead Implant Date: 20030322
Implantable Lead Location: 753859
Implantable Lead Location: 753860
Implantable Lead Model: 5092
Implantable Lead Model: 5592
Implantable Pulse Generator Implant Date: 20110526
Lead Channel Impedance Value: 388 Ohm
Lead Channel Impedance Value: 397 Ohm
Lead Channel Pacing Threshold Amplitude: 0.75 V
Lead Channel Pacing Threshold Amplitude: 1 V
Lead Channel Pacing Threshold Pulse Width: 0.4 ms
Lead Channel Pacing Threshold Pulse Width: 0.4 ms
Lead Channel Setting Pacing Amplitude: 2 V
Lead Channel Setting Pacing Amplitude: 2 V
Lead Channel Setting Pacing Pulse Width: 0.4 ms
Lead Channel Setting Sensing Sensitivity: 2 mV

## 2021-12-21 ENCOUNTER — Telehealth: Payer: Self-pay

## 2021-12-21 NOTE — Telephone Encounter (Signed)
Pt's nipple on left side is "sore, & little hard". Pt states its been there for couple weeks now. [facility] states nipple isn't swollen (same size as nipple on right), & no discharge. It maybe a little redder than the right side. I told them both I would report to provider and return call.   Dr Bobby Rumpf notified of above, he recommends pt see his PCP. I called pts wife and gave her Dr Bobby Rumpf recommendation. She verbalized understanding.

## 2021-12-22 DIAGNOSIS — C9110 Chronic lymphocytic leukemia of B-cell type not having achieved remission: Secondary | ICD-10-CM | POA: Diagnosis not present

## 2021-12-22 DIAGNOSIS — N6320 Unspecified lump in the left breast, unspecified quadrant: Secondary | ICD-10-CM | POA: Diagnosis not present

## 2021-12-22 DIAGNOSIS — R053 Chronic cough: Secondary | ICD-10-CM | POA: Diagnosis not present

## 2022-01-05 NOTE — Progress Notes (Signed)
Remote pacemaker transmission.   

## 2022-01-07 DIAGNOSIS — R922 Inconclusive mammogram: Secondary | ICD-10-CM | POA: Diagnosis not present

## 2022-01-07 DIAGNOSIS — N62 Hypertrophy of breast: Secondary | ICD-10-CM | POA: Diagnosis not present

## 2022-01-28 ENCOUNTER — Other Ambulatory Visit: Payer: Self-pay | Admitting: Hematology and Oncology

## 2022-01-28 DIAGNOSIS — F411 Generalized anxiety disorder: Secondary | ICD-10-CM

## 2022-02-11 DIAGNOSIS — E039 Hypothyroidism, unspecified: Secondary | ICD-10-CM | POA: Diagnosis not present

## 2022-02-11 DIAGNOSIS — I10 Essential (primary) hypertension: Secondary | ICD-10-CM | POA: Diagnosis not present

## 2022-02-11 DIAGNOSIS — C9110 Chronic lymphocytic leukemia of B-cell type not having achieved remission: Secondary | ICD-10-CM | POA: Diagnosis not present

## 2022-02-11 DIAGNOSIS — N1831 Chronic kidney disease, stage 3a: Secondary | ICD-10-CM | POA: Diagnosis not present

## 2022-03-07 NOTE — Progress Notes (Signed)
[place] [facility]  [address] [place],  [place]  [address] [phone]  Clinic Day:  03/08/2022  Referring physician: [name], MD  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  Due to his disease leading to significant anemia, he was placed on acalabrutinib 100 mg twice daily in late July 2022.  He comes in today to reassess his peripheral counts.  Since his last visit, the patient has been doing okay.  He claims his fatigue continues to improve.  He denies having any B symptoms or bulky lymphadenopathy which concerns him for catabolic disease progression while on acalabrutinib.    VITALS:  Blood pressure (!) 210/77, pulse 69, temperature 98.3 F (36.8 C), resp. rate 16, height '5\' 7"'$  (1.702 m), weight 181 lb 8 oz (82.3 kg), SpO2 92 %.  Wt Readings from Last 3 Encounters:  03/08/22 181 lb 8 oz (82.3 kg)  11/03/21 177 lb 11.2 oz (80.6 kg)  07/03/21 167 lb 1.6 oz (75.8 kg)    Body mass index is 28.43 kg/m.  Performance status (ECOG): 2 - Symptomatic, <50% confined to bed  PHYSICAL EXAM:  Physical Exam Constitutional:      General: He is not in acute distress.    Appearance: Normal appearance. He is normal weight.  HENT:     Head: Normocephalic and atraumatic.  Eyes:     General: No scleral icterus.    Extraocular Movements: Extraocular movements intact.     Conjunctiva/sclera: Conjunctivae normal.     Pupils: Pupils are equal, round, and reactive to light.  Cardiovascular:     Rate and Rhythm: Regular rhythm. Bradycardia present.     Pulses: Normal pulses.     Heart sounds: Normal heart sounds. No murmur heard.    No friction rub. No gallop.  Pulmonary:     Effort: Pulmonary effort is normal. No respiratory distress.     Breath sounds: Normal breath sounds.  Abdominal:     General: Bowel sounds are normal. There is  no distension.     Palpations: Abdomen is soft. There is no hepatomegaly, splenomegaly or mass.     Tenderness: There is no abdominal tenderness.  Musculoskeletal:        General: Normal range of motion.     Cervical back: Normal range of motion and neck supple.     Right lower leg: No edema.     Left lower leg: No edema.  Lymphadenopathy:     Cervical: No cervical adenopathy.  Skin:    General: Skin is warm and dry.  Neurological:     General: No focal deficit present.     Mental Status: He is alert and oriented to person, place, and time. Mental status is at baseline.  Psychiatric:        Mood and Affect: Mood normal.        Behavior: Behavior normal.        Thought Content: Thought content normal.        Judgment: Judgment normal.    LABS:    ASSESSMENT & PLAN:  Assessment/Plan:  An 81 year old gentleman with chronic lymphocytic leukemia.  I am pleased as his white count continues to fall.  Furthermore, his hemoglobin his risen back above 11.  There is nothing per his labs or physical exam which concerns me for progression of his CLL.  He knows  to continue taking his acalabrutinib at 100 mg twice daily.  Clinically, he is doing well.  I will see him back in 4 months for repeat clinical assessment.  The patient understands all the plans discussed today and is in agreement with them.    Reynald Woods Macarthur Critchley, MD

## 2022-03-08 ENCOUNTER — Other Ambulatory Visit: Payer: Self-pay | Admitting: Oncology

## 2022-03-08 ENCOUNTER — Inpatient Hospital Stay: Payer: Medicare Other

## 2022-03-08 ENCOUNTER — Inpatient Hospital Stay: Payer: Medicare Other | Attending: Oncology | Admitting: Oncology

## 2022-03-08 ENCOUNTER — Telehealth: Payer: Self-pay | Admitting: Oncology

## 2022-03-08 VITALS — BP 210/77 | HR 69 | Temp 98.3°F | Resp 16 | Ht 67.0 in | Wt 181.5 lb

## 2022-03-08 DIAGNOSIS — C9110 Chronic lymphocytic leukemia of B-cell type not having achieved remission: Secondary | ICD-10-CM

## 2022-03-08 DIAGNOSIS — Z23 Encounter for immunization: Secondary | ICD-10-CM | POA: Diagnosis not present

## 2022-03-08 LAB — CBC: RBC: 3.48 — AB (ref 3.87–5.11)

## 2022-03-08 LAB — CBC AND DIFFERENTIAL
HCT: 33 — AB (ref 41–53)
Hemoglobin: 11.1 — AB (ref 13.5–17.5)
Neutrophils Absolute: 2.64
Platelets: 123 10*3/uL — AB (ref 150–400)
WBC: 24

## 2022-03-08 NOTE — Telephone Encounter (Signed)
03/08/22 Next appt scheduled and confirmed with patient

## 2022-03-17 ENCOUNTER — Encounter: Payer: Self-pay | Admitting: Oncology

## 2022-03-17 ENCOUNTER — Ambulatory Visit (INDEPENDENT_AMBULATORY_CARE_PROVIDER_SITE_OTHER): Payer: Medicare Other

## 2022-03-17 DIAGNOSIS — I442 Atrioventricular block, complete: Secondary | ICD-10-CM | POA: Diagnosis not present

## 2022-03-18 LAB — CUP PACEART REMOTE DEVICE CHECK
Battery Impedance: 1289 Ohm
Battery Remaining Longevity: 69 mo
Battery Voltage: 2.78 V
Brady Statistic AP VP Percent: 2 %
Brady Statistic AP VS Percent: 0 %
Brady Statistic AS VP Percent: 1 %
Brady Statistic AS VS Percent: 96 %
Date Time Interrogation Session: 20231012113623
Implantable Lead Implant Date: 20020322
Implantable Lead Implant Date: 20030322
Implantable Lead Location: 753859
Implantable Lead Location: 753860
Implantable Lead Model: 5092
Implantable Lead Model: 5592
Implantable Pulse Generator Implant Date: 20110526
Lead Channel Impedance Value: 392 Ohm
Lead Channel Impedance Value: 409 Ohm
Lead Channel Pacing Threshold Amplitude: 0.75 V
Lead Channel Pacing Threshold Amplitude: 0.875 V
Lead Channel Pacing Threshold Pulse Width: 0.4 ms
Lead Channel Pacing Threshold Pulse Width: 0.4 ms
Lead Channel Sensing Intrinsic Amplitude: 5.6 mV
Lead Channel Setting Pacing Amplitude: 1.75 V
Lead Channel Setting Pacing Amplitude: 2 V
Lead Channel Setting Pacing Pulse Width: 0.4 ms
Lead Channel Setting Sensing Sensitivity: 2 mV

## 2022-03-30 NOTE — Progress Notes (Signed)
Remote pacemaker transmission.   

## 2022-05-04 DIAGNOSIS — L578 Other skin changes due to chronic exposure to nonionizing radiation: Secondary | ICD-10-CM | POA: Diagnosis not present

## 2022-05-04 DIAGNOSIS — L570 Actinic keratosis: Secondary | ICD-10-CM | POA: Diagnosis not present

## 2022-05-04 DIAGNOSIS — C44219 Basal cell carcinoma of skin of left ear and external auricular canal: Secondary | ICD-10-CM | POA: Diagnosis not present

## 2022-05-04 DIAGNOSIS — C44311 Basal cell carcinoma of skin of nose: Secondary | ICD-10-CM | POA: Diagnosis not present

## 2022-05-04 DIAGNOSIS — L821 Other seborrheic keratosis: Secondary | ICD-10-CM | POA: Diagnosis not present

## 2022-05-10 ENCOUNTER — Ambulatory Visit: Payer: Medicare Other | Attending: Cardiology | Admitting: Cardiology

## 2022-05-10 ENCOUNTER — Encounter: Payer: Self-pay | Admitting: Cardiology

## 2022-05-10 VITALS — BP 150/78 | HR 62 | Ht 67.0 in | Wt 181.0 lb

## 2022-05-10 DIAGNOSIS — I442 Atrioventricular block, complete: Secondary | ICD-10-CM | POA: Diagnosis not present

## 2022-05-10 LAB — CUP PACEART INCLINIC DEVICE CHECK
Battery Impedance: 1231 Ohm
Battery Remaining Longevity: 72 mo
Battery Voltage: 2.77 V
Brady Statistic AP VP Percent: 2 %
Brady Statistic AP VS Percent: 0 %
Brady Statistic AS VP Percent: 1 %
Brady Statistic AS VS Percent: 96 %
Date Time Interrogation Session: 20231204163048
Implantable Lead Connection Status: 753985
Implantable Lead Connection Status: 753985
Implantable Lead Implant Date: 20020322
Implantable Lead Implant Date: 20030322
Implantable Lead Location: 753859
Implantable Lead Location: 753860
Implantable Lead Model: 5092
Implantable Lead Model: 5592
Implantable Pulse Generator Implant Date: 20110526
Lead Channel Impedance Value: 388 Ohm
Lead Channel Impedance Value: 406 Ohm
Lead Channel Pacing Threshold Amplitude: 0.625 V
Lead Channel Pacing Threshold Amplitude: 0.875 V
Lead Channel Pacing Threshold Pulse Width: 0.4 ms
Lead Channel Pacing Threshold Pulse Width: 0.4 ms
Lead Channel Sensing Intrinsic Amplitude: 1 mV
Lead Channel Sensing Intrinsic Amplitude: 4 mV
Lead Channel Setting Pacing Amplitude: 1.75 V
Lead Channel Setting Pacing Amplitude: 2 V
Lead Channel Setting Pacing Pulse Width: 0.4 ms
Lead Channel Setting Sensing Sensitivity: 2 mV
Zone Setting Status: 755011
Zone Setting Status: 755011

## 2022-05-10 NOTE — Progress Notes (Signed)
Electrophysiology Office Note   Date:  05/10/2022   ID:  Michael, Aguirre 01-Oct-1940, MRN [id number]  PCP:  [name], MD  Cardiologist:  Agustin Cree Primary Electrophysiologist:  Nanette Wirsing Meredith Leeds, MD    No chief complaint on file.     History of Present Illness: Michael Aguirre is a 81 y.o. male who is being seen today for the evaluation of pacemaker at the request of Michael Aguirre. Presenting today for electrophysiology evaluation.    He has a history significant for transient complete heart block and hypertension.  He is post Medtronic dual-chamber pacemaker.  Today, denies symptoms of palpitations, chest pain, shortness of breath, orthopnea, PND, lower extremity edema, claudication, dizziness, presyncope, syncope, bleeding, or neurologic sequela. The patient is tolerating medications without difficulties.  His main complaint today is fatigue.  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  Feels that this may be contributing.  Past Medical History:  Diagnosis Date   CLL (chronic lymphocytic leukemia) ([place])    Sciatic nerve pain    Past Surgical History:  Procedure Laterality Date   INSERT / REPLACE / REMOVE PACEMAKER     Medtronic     Current Outpatient Medications  Medication Sig Dispense Refill   ALPRAZolam (XANAX) 0.5 MG tablet TAKE 1 TABLET BY MOUTH TWICE  DAILY AS NEEDED FOR ANXIETY 60 tablet 0   AMITIZA 24 MCG capsule Take 24 mcg by mouth 2 (two) times daily.     amLODipine (NORVASC) 10 MG tablet Take 1 tablet (10 mg total) by mouth daily. 30 tablet 6   CALQUENCE 100 MG capsule Take by mouth.     chlorpheniramine-HYDROcodone (TUSSIONEX) 10-8 MG/5ML SUER Take 5 mLs by mouth every 12 (twelve) hours as needed for cough. 140 mL 0   [employer] ANTACID/ANTI-GAS 200-200-20 MG/5ML suspension Take by mouth.     finasteride (PROSCAR) 5 MG tablet Take 5 mg by mouth daily.  3   fluconazole (DIFLUCAN) 150 MG tablet Take by mouth.     furosemide (LASIX) 20 MG tablet Take 20 mg by mouth as  needed.      hydrochlorothiazide (MICROZIDE) 12.5 MG capsule Take 12.5 mg by mouth daily.     levothyroxine (SYNTHROID) 50 MCG tablet Take 50 mcg by mouth daily before breakfast.     lisinopril (ZESTRIL) 40 MG tablet Take 1 tablet by mouth daily.     montelukast (SINGULAIR) 10 MG tablet Take 10 mg by mouth at bedtime.     mupirocin ointment (BACTROBAN) 2 % Apply 1 application topically 2 (two) times daily.     nitroGLYCERIN (NITROSTAT) 0.4 MG SL tablet Place 1 tablet (0.4 mg total) under the tongue every 5 (five) minutes as needed for chest pain. Patient needs an appointment for further refills. 2 nd attempt 15 tablet 0   ondansetron (ZOFRAN) 8 MG tablet Take 8 mg by mouth as needed for nausea or vomiting.     pregabalin (LYRICA) 75 MG capsule Take 75 mg by mouth daily.     tamsulosin (FLOMAX) 0.4 MG CAPS capsule Take 0.8 mg by mouth daily.     No current facility-administered medications for this visit.    Allergies:   Tetracyclines & related, Isosorbide, Tetracycline, and Other   Social History:  The patient  reports that he has never smoked. He has never used smokeless tobacco. He reports that he does not currently use alcohol. He reports that he does not use drugs.   Family History:  The patient's family history includes Colon cancer  in his brother; Hypertension in his father and mother; Throat cancer in his sister.   ROS:  Please see the history of present illness.   Otherwise, review of systems is positive for none.   All other systems are reviewed and negative.   PHYSICAL EXAM: VS:  BP (!) 150/78   Pulse 62   Ht '5\' 7"'$  (1.702 m)   Wt 181 lb (82.1 kg)   SpO2 95%   BMI 28.35 kg/m  , BMI Body mass index is 28.35 kg/m. GEN: Well nourished, well developed, in no acute distress  HEENT: normal  Neck: no JVD, carotid bruits, or masses Cardiac: RRR; no murmurs, rubs, or gallops,no edema  Respiratory:  clear to auscultation bilaterally, normal work of breathing GI: soft, nontender,  nondistended, + BS MS: no deformity or atrophy  Skin: warm and dry, device site well healed Neuro:  Strength and sensation are intact Psych: euthymic mood, full affect  EKG:  EKG is ordered today. Personal review of the ekg ordered shows sinus rhythm, rate 62  Personal review of the device interrogation today. Results in [place]: 03/08/2022: Hemoglobin 11.1; Platelets 123    Lipid Panel     Component Value Date/Time   CHOL 217 (H) 05/24/2018 1704   TRIG 217 (H) 05/24/2018 1704   HDL 35 (L) 05/24/2018 1704   CHOLHDL 6.2 (H) 05/24/2018 1704   LDLCALC 139 (H) 05/24/2018 1704     Wt Readings from Last 3 Encounters:  05/10/22 181 lb (82.1 kg)  03/08/22 181 lb 8 oz (82.3 kg)  11/03/21 177 lb 11.2 oz (80.6 kg)      Other studies Reviewed: Additional studies/ records that were reviewed today include: TTE 03/29/19 1. Left ventricular ejection fraction, by visual estimation, is 60 to  65%. The left ventricle has normal function. Normal left ventricular size.  Left ventricular septal wall thickness was mildly increased. Mildly  increased left ventricular posterior  wall thickness. There is mildly increased left ventricular hypertrophy.   2. Left ventricular diastolic Doppler parameters are consistent with  pseudonormalization pattern of LV diastolic filling.   3. Global right ventricle has normal systolic function.The right  ventricular size is normal. No increase in right ventricular wall  thickness. Pacer wire seen in the right ventricle.   4. Left atrial size was mildly dilated.   5. Right atrial size was normal.   6. The mitral valve is normal in structure. Mild mitral valve  regurgitation. No evidence of mitral stenosis.   7. The tricuspid valve is normal in structure. Tricuspid valve  regurgitation is trivial.   8. Mild aortic valve annular calcification.   9. The aortic valve is normal in structure. Aortic valve regurgitation is  trivial by color flow  Doppler. Structurally normal aortic valve, with no  evidence of sclerosis or stenosis.  10. The pulmonic valve was normal in structure. Pulmonic valve  regurgitation is not visualized by color flow Doppler.  11. Mildly elevated pulmonary artery systolic pressure.    ASSESSMENT AND PLAN:  1.  Transient third-degree AV block: Status post Medtronic dual-chamber pacemaker.  Device functioning appropriately. Pacing rate reduced to DDD 40 as he was having pocket stimulation.  No other changes.  2.  Hypertension: Elevated today.  We do not know his current home medications.  We Fordyce Lepak allow his primary physician to make adjustments to his blood pressure.  Current medicines are reviewed at length with the patient today.   The patient does not  have concerns regarding his medicines.  The following changes were made today: none  Labs/ tests ordered today include:  Orders Placed This Encounter  Procedures   EKG 12-Lead      Disposition:   FU with Myson Levi 1 year  Signed, Sirenia Whitis Meredith Leeds, MD  05/10/2022 3:52 PM     [place] [address] [phone] (office) [phone] (fax)

## 2022-05-10 NOTE — Patient Instructions (Signed)
Medication Instructions:  Your physician recommends that you continue on your current medications as directed. Please refer to the Current Medication list given to you today.  *If you need a refill on your cardiac medications before your next appointment, please call your pharmacy*   Lab Work: None ordered   Testing/Procedures: None ordered   Follow-Up: At [facility], you and your health needs are our priority.  As part of our continuing mission to provide you with exceptional heart care, we have created designated Provider Care Teams.  These Care Teams include your primary Cardiologist (physician) and Advanced Practice Providers (APPs -  Physician Assistants and Nurse Practitioners) who all work together to provide you with the care you need, when you need it.  Remote monitoring is used to monitor your Pacemaker or ICD from home. This monitoring reduces the number of office visits required to check your device to one time per year. It allows [place] to keep an eye on the functioning of your device to ensure it is working properly. You are scheduled for a device check from home on 06/16/2022. You may send your transmission at any time that day. If you have a wireless device, the transmission will be sent automatically. After your physician reviews your transmission, you will receive a postcard with your next transmission date.  Your next appointment:   1 year(s)  The format for your next appointment:   In Person  Provider:   Allegra Lai, MD    Thank you for choosing [facility]!!   Trinidad Curet, RN [phone]    Other Instructions  Important Information About [place]

## 2022-05-19 ENCOUNTER — Other Ambulatory Visit: Payer: Self-pay

## 2022-05-19 ENCOUNTER — Telehealth: Payer: Self-pay | Admitting: Cardiology

## 2022-05-19 MED ORDER — NITROGLYCERIN 0.4 MG SL SUBL: 0.4000 mg | SUBLINGUAL_TABLET | SUBLINGUAL | 11 refills | Status: DC | PRN

## 2022-05-19 NOTE — Telephone Encounter (Signed)
*  STAT* If patient is at the pharmacy, call can be transferred to refill team.   1. Which medications need to be refilled? (please list name of each medication and dose if known)   nitroGLYCERIN (NITROSTAT) 0.4 MG SL tablet    2. Which pharmacy/location (including street and city if local pharmacy) is medication to be sent to? [id number] [place] [place] [facility] [address]  3. Do they need a 30 day or 90 day supply? [address] [place]

## 2022-05-20 DIAGNOSIS — R0789 Other chest pain: Secondary | ICD-10-CM | POA: Diagnosis not present

## 2022-05-20 DIAGNOSIS — Z79899 Other long term (current) drug therapy: Secondary | ICD-10-CM | POA: Diagnosis not present

## 2022-05-20 DIAGNOSIS — C9110 Chronic lymphocytic leukemia of B-cell type not having achieved remission: Secondary | ICD-10-CM | POA: Diagnosis not present

## 2022-05-20 DIAGNOSIS — R531 Weakness: Secondary | ICD-10-CM | POA: Diagnosis not present

## 2022-05-20 DIAGNOSIS — Z743 Need for continuous supervision: Secondary | ICD-10-CM | POA: Diagnosis not present

## 2022-05-20 DIAGNOSIS — R000 Tachycardia, unspecified: Secondary | ICD-10-CM | POA: Diagnosis not present

## 2022-05-20 DIAGNOSIS — Z86711 Personal history of pulmonary embolism: Secondary | ICD-10-CM | POA: Diagnosis not present

## 2022-05-20 DIAGNOSIS — Z91041 Radiographic dye allergy status: Secondary | ICD-10-CM | POA: Diagnosis not present

## 2022-05-20 DIAGNOSIS — I10 Essential (primary) hypertension: Secondary | ICD-10-CM | POA: Diagnosis not present

## 2022-05-20 DIAGNOSIS — E860 Dehydration: Secondary | ICD-10-CM | POA: Diagnosis not present

## 2022-05-20 DIAGNOSIS — Z950 Presence of cardiac pacemaker: Secondary | ICD-10-CM | POA: Diagnosis not present

## 2022-05-20 DIAGNOSIS — I4891 Unspecified atrial fibrillation: Secondary | ICD-10-CM | POA: Diagnosis not present

## 2022-05-20 DIAGNOSIS — Z888 Allergy status to other drugs, medicaments and biological substances status: Secondary | ICD-10-CM | POA: Diagnosis not present

## 2022-05-20 DIAGNOSIS — D696 Thrombocytopenia, unspecified: Secondary | ICD-10-CM | POA: Diagnosis not present

## 2022-05-20 DIAGNOSIS — Z792 Long term (current) use of antibiotics: Secondary | ICD-10-CM | POA: Diagnosis not present

## 2022-05-21 DIAGNOSIS — R0789 Other chest pain: Secondary | ICD-10-CM

## 2022-05-21 DIAGNOSIS — Z950 Presence of cardiac pacemaker: Secondary | ICD-10-CM

## 2022-05-21 DIAGNOSIS — I4891 Unspecified atrial fibrillation: Secondary | ICD-10-CM | POA: Diagnosis not present

## 2022-05-21 DIAGNOSIS — C9110 Chronic lymphocytic leukemia of B-cell type not having achieved remission: Secondary | ICD-10-CM | POA: Diagnosis not present

## 2022-05-21 DIAGNOSIS — C9190 Lymphoid leukemia, unspecified not having achieved remission: Secondary | ICD-10-CM | POA: Diagnosis not present

## 2022-05-21 DIAGNOSIS — R531 Weakness: Secondary | ICD-10-CM | POA: Diagnosis not present

## 2022-05-26 ENCOUNTER — Ambulatory Visit: Payer: Medicare Other | Attending: Cardiology | Admitting: Cardiology

## 2022-05-26 ENCOUNTER — Encounter: Payer: Self-pay | Admitting: Cardiology

## 2022-05-26 VITALS — BP 130/64 | HR 56 | Ht 67.0 in | Wt 182.0 lb

## 2022-05-26 DIAGNOSIS — I480 Paroxysmal atrial fibrillation: Secondary | ICD-10-CM

## 2022-05-26 DIAGNOSIS — M7989 Other specified soft tissue disorders: Secondary | ICD-10-CM | POA: Diagnosis not present

## 2022-05-26 DIAGNOSIS — Z950 Presence of cardiac pacemaker: Secondary | ICD-10-CM

## 2022-05-26 DIAGNOSIS — I442 Atrioventricular block, complete: Secondary | ICD-10-CM

## 2022-05-26 DIAGNOSIS — C9110 Chronic lymphocytic leukemia of B-cell type not having achieved remission: Secondary | ICD-10-CM | POA: Diagnosis not present

## 2022-05-26 MED ORDER — ISOSORBIDE MONONITRATE ER 30 MG PO TB24: 30.0000 mg | ORAL_TABLET | Freq: Every day | ORAL | 3 refills | Status: DC

## 2022-05-26 NOTE — Patient Instructions (Signed)
Medication Instructions:  Your physician has recommended you make the following change in your medication:   START: Imdur '30mg'$  1 tablet daily    Lab Work: None Ordered If you have labs (blood work) drawn today and your tests are completely normal, you will receive your results only by: [place] (if you have MyChart) OR A paper copy in the mail If you have any lab test that is abnormal or we need to change your treatment, we will call you to review the results.   Testing/Procedures: None Ordered   Follow-Up: At [facility], you and your health needs are our priority.  As part of our continuing mission to provide you with exceptional heart care, we have created designated Provider Care Teams.  These Care Teams include your primary Cardiologist (physician) and Advanced Practice Providers (APPs -  Physician Assistants and Nurse Practitioners) who all work together to provide you with the care you need, when you need it.  We recommend signing up for the patient portal called "MyChart".  Sign up information is provided on this After Visit Summary.  MyChart is used to connect with patients for Virtual Visits (Telemedicine).  Patients are able to view lab/test results, encounter notes, upcoming appointments, etc.  Non-urgent messages can be sent to your provider as well.   To learn more about what you can do with MyChart, go to [email or web address].    Your next appointment:   1 month(s)  The format for your next appointment:   In Person  Provider:   Jenne Campus, MD    Other Instructions Pacemaker check scheduled 06-16-2022 7am

## 2022-05-27 DIAGNOSIS — I480 Paroxysmal atrial fibrillation: Secondary | ICD-10-CM | POA: Insufficient documentation

## 2022-05-27 HISTORY — DX: Paroxysmal atrial fibrillation: I48.0

## 2022-05-27 NOTE — Progress Notes (Signed)
Cardiology Office Note:    Date:  05/27/2022   ID:  Michael Aguirre, DOB Oct 06, 1940, MRN [id number]  PCP:  [name], MD  Cardiologist:  Jenne Campus, MD    Referring MD: [name], MD   Chief Complaint  Patient presents with   Follow-up  I was in the hospital  History of Present Illness:    Michael Aguirre is a 81 y.o. male past medical history significant for transient complete heart block that required pacemaker, CLL, swelling of lower extremities, recently he ended up being in the hospital because of weakness fatigue, he was find to have very high blood pressure on top of that he was found to be in atrial fibrillation fast ventricular rate.  He eventually converted spontaneously.  He was referred to [place] for follow-up.  Overall he is doing fair.  Still tired with exhausted.  Does not have much energy.  Past Medical History:  Diagnosis Date   CLL (chronic lymphocytic leukemia) ([place])    Sciatic nerve pain     Past Surgical History:  Procedure Laterality Date   INSERT / REPLACE / REMOVE PACEMAKER     Medtronic    Current Medications: Current Meds  Medication Sig   ALPRAZolam (XANAX) 0.5 MG tablet TAKE 1 TABLET BY MOUTH TWICE  DAILY AS NEEDED FOR ANXIETY   amLODipine (NORVASC) 10 MG tablet Take 1 tablet (10 mg total) by mouth daily.   CALQUENCE 100 MG capsule Take by mouth.   Carbinoxamine Maleate 4 MG TABS Take 1 tablet by mouth 2 (two) times daily as needed (allergies).   chlorpheniramine-HYDROcodone (TUSSIONEX) 10-8 MG/5ML SUER Take 5 mLs by mouth every 12 (twelve) hours as needed for cough.   CVS ANTACID/ANTI-GAS 200-200-20 MG/5ML suspension Take by mouth.   finasteride (PROSCAR) 5 MG tablet Take 5 mg by mouth daily.   furosemide (LASIX) 20 MG tablet Take 20 mg by mouth as needed.    hydrochlorothiazide (MICROZIDE) 12.5 MG capsule Take 12.5 mg by mouth daily.   isosorbide mononitrate (IMDUR) 30 MG 24 hr tablet Take 1 tablet (30 mg total) by mouth  daily.   levothyroxine (SYNTHROID) 50 MCG tablet Take 50 mcg by mouth daily before breakfast.   lisinopril (ZESTRIL) 5 MG tablet Take 5 mg by mouth daily.   mupirocin ointment (BACTROBAN) 2 % Apply 1 application topically 2 (two) times daily.   nitroGLYCERIN (NITROSTAT) 0.4 MG SL tablet Place 1 tablet (0.4 mg total) under the tongue every 5 (five) minutes as needed for chest pain.   ondansetron (ZOFRAN) 8 MG tablet Take 8 mg by mouth as needed for nausea or vomiting.   pregabalin (LYRICA) 75 MG capsule Take 75 mg by mouth daily.   tamsulosin (FLOMAX) 0.4 MG CAPS capsule Take 0.8 mg by mouth daily.     Allergies:   Tetracyclines & related, Isosorbide, Tetracycline, and Other   Social History   Socioeconomic History   Marital status: Married    Spouse name: Not on file   Number of children: Not on file   Years of education: Not on file   Highest education level: Not on file  Occupational History   Not on file  Tobacco Use   Smoking status: Never   Smokeless tobacco: Never  Vaping Use   Vaping Use: Never used  Substance and Sexual Activity   Alcohol use: Not Currently   Drug use: Never   Sexual activity: Not on file  Other Topics Concern   Not on  file  Social History Narrative   Not on file   Social Determinants of Health   Financial Resource Strain: Not on file  Food Insecurity: Not on file  Transportation Needs: Not on file  Physical Activity: Not on file  Stress: Not on file  Social Connections: Not on file     Family History: The patient's family history includes Colon cancer in his brother; Hypertension in his father and mother; Throat cancer in his sister. ROS:   Please see the history of present illness.    All 14 point review of systems negative except as described per history of present illness  EKGs/Labs/Other Studies Reviewed:      Recent Labs: 03/08/2022: Hemoglobin 11.1; Platelets 123  Recent Lipid Panel    Component Value Date/Time   CHOL 217 (H)  05/24/2018 1704   TRIG 217 (H) 05/24/2018 1704   HDL 35 (L) 05/24/2018 1704   CHOLHDL 6.2 (H) 05/24/2018 1704   LDLCALC 139 (H) 05/24/2018 1704    Physical Exam:    VS:  BP 130/64 (BP Location: Left Arm, Patient Position: Sitting, Cuff Size: Normal)   Pulse (!) 56   Ht '5\' 7"'$  (1.702 m)   Wt 182 lb (82.6 kg)   SpO2 99%   BMI 28.51 kg/m     Wt Readings from Last 3 Encounters:  05/26/22 182 lb (82.6 kg)  05/10/22 181 lb (82.1 kg)  03/08/22 181 lb 8 oz (82.3 kg)     GEN:  Well nourished, well developed in no acute distress HEENT: Normal NECK: No JVD; No carotid bruits LYMPHATICS: No lymphadenopathy CARDIAC: RRR, no murmurs, no rubs, no gallops RESPIRATORY:  Clear to auscultation without rales, wheezing or rhonchi  ABDOMEN: Soft, non-tender, non-distended MUSCULOSKELETAL:  No edema; No deformity  SKIN: Warm and dry LOWER EXTREMITIES: no swelling NEUROLOGIC:  Alert and oriented x 3 PSYCHIATRIC:  Normal affect   ASSESSMENT:    1. Paroxysmal atrial fibrillation (HCC)   2. Transient complete heart block (HCC)   3. Pacemaker   4. CLL (chronic lymphocytic leukemia) ([place])   5. Swelling of both lower extremities    PLAN:    In order of problems listed above:  Paroxysmal atrial fibrillation first documented episodes of atrial fibrillation, because of his CLL, anemia decision was made in the hospital not to pursue anticoagulation.  However I feel uncomfortable about this.  What I will do is I will see him back in about 1 month and a week before that we will interrogate his device to see if he had an episode of atrial fibrillation with that is the case then we will continue with anticoagulation. Atypical chest pain while in the hospital.  I will start him on Imdur which should also help with his blood pressure. CLL stable. I did review record for this visit from the hospital   Medication Adjustments/Labs and Tests Ordered: Current medicines are reviewed at length with the  patient today.  Concerns regarding medicines are outlined above.  No orders of the defined types were placed in this encounter.  Medication changes:  Meds ordered this encounter  Medications   isosorbide mononitrate (IMDUR) 30 MG 24 hr tablet    Sig: Take 1 tablet (30 mg total) by mouth daily.    Dispense:  90 tablet    Refill:  3    Signed, Park Liter, MD, [facility] 05/27/2022 10:51 AM    [place]

## 2022-06-09 ENCOUNTER — Encounter: Payer: Self-pay | Admitting: Hematology and Oncology

## 2022-06-15 DIAGNOSIS — E860 Dehydration: Secondary | ICD-10-CM | POA: Diagnosis not present

## 2022-06-15 DIAGNOSIS — R55 Syncope and collapse: Secondary | ICD-10-CM | POA: Diagnosis not present

## 2022-06-16 ENCOUNTER — Telehealth: Payer: Self-pay | Admitting: Cardiology

## 2022-06-16 ENCOUNTER — Ambulatory Visit (INDEPENDENT_AMBULATORY_CARE_PROVIDER_SITE_OTHER): Payer: Medicare HMO

## 2022-06-16 DIAGNOSIS — I442 Atrioventricular block, complete: Secondary | ICD-10-CM

## 2022-06-16 LAB — CUP PACEART REMOTE DEVICE CHECK
Battery Impedance: 1398 Ohm
Battery Remaining Longevity: 65 mo
Battery Voltage: 2.77 V
Brady Statistic AP VP Percent: 1 %
Brady Statistic AP VS Percent: 0 %
Brady Statistic AS VP Percent: 1 %
Brady Statistic AS VS Percent: 97 %
Date Time Interrogation Session: 20240110121509
Implantable Lead Connection Status: 753985
Implantable Lead Connection Status: 753985
Implantable Lead Implant Date: 20020322
Implantable Lead Implant Date: 20030322
Implantable Lead Location: 753859
Implantable Lead Location: 753860
Implantable Lead Model: 5092
Implantable Lead Model: 5592
Implantable Pulse Generator Implant Date: 20110526
Lead Channel Impedance Value: 403 Ohm
Lead Channel Impedance Value: 413 Ohm
Lead Channel Pacing Threshold Amplitude: 0.625 V
Lead Channel Pacing Threshold Amplitude: 0.875 V
Lead Channel Pacing Threshold Pulse Width: 0.4 ms
Lead Channel Pacing Threshold Pulse Width: 0.4 ms
Lead Channel Setting Pacing Amplitude: 1.875
Lead Channel Setting Pacing Amplitude: 2 V
Lead Channel Setting Pacing Pulse Width: 0.4 ms
Lead Channel Setting Sensing Sensitivity: 2 mV
Zone Setting Status: 755011
Zone Setting Status: 755011

## 2022-06-16 NOTE — Telephone Encounter (Signed)
Patient's wife states yesterday the patient became abnormally weak and she called 911 to have him checked out. She states he is back to normal now, but she would like to know if his PPM can be checked for any abnormalities around 2:10 PM yesterday, 1/09.

## 2022-06-16 NOTE — Telephone Encounter (Signed)
Returned call to Pt's wife.  Per wife, yesterday Pt became unresponsive.  She checked his BP and it was 50/30.  Her sister who is a cardiac nurse advised her to call 911.  Upon arrival of ambulance Pt aroused and became responsive and BP check by EMS was 130/70's.    Transmission received.  Pt had no significant arrhythmia's noted.  Discussed with wife.  Advised she check Pt's blood pressure before she gives him his morning medication.  Advised to hold AM blood pressure medications if his systolic blood pressure is 100 or less.  She indicates understanding.  Advised to call back if she had further concerns.

## 2022-06-16 NOTE — Telephone Encounter (Signed)
The patient wife agreed to help the patient send a manual transmission for the nurse to review.

## 2022-06-16 NOTE — Telephone Encounter (Signed)
Sent to front desk to schedule appt per Dr. Agustin Cree

## 2022-06-18 ENCOUNTER — Ambulatory Visit: Payer: Medicare HMO | Attending: Cardiology | Admitting: Cardiology

## 2022-06-18 ENCOUNTER — Encounter: Payer: Self-pay | Admitting: Cardiology

## 2022-06-18 VITALS — BP 110/62 | HR 75 | Ht 67.0 in | Wt 181.0 lb

## 2022-06-18 DIAGNOSIS — C9110 Chronic lymphocytic leukemia of B-cell type not having achieved remission: Secondary | ICD-10-CM | POA: Diagnosis not present

## 2022-06-18 DIAGNOSIS — R001 Bradycardia, unspecified: Secondary | ICD-10-CM | POA: Diagnosis not present

## 2022-06-18 DIAGNOSIS — I480 Paroxysmal atrial fibrillation: Secondary | ICD-10-CM | POA: Diagnosis not present

## 2022-06-18 DIAGNOSIS — R55 Syncope and collapse: Secondary | ICD-10-CM | POA: Diagnosis not present

## 2022-06-18 DIAGNOSIS — Z20822 Contact with and (suspected) exposure to covid-19: Secondary | ICD-10-CM | POA: Diagnosis not present

## 2022-06-18 DIAGNOSIS — I959 Hypotension, unspecified: Secondary | ICD-10-CM | POA: Diagnosis not present

## 2022-06-18 DIAGNOSIS — Z950 Presence of cardiac pacemaker: Secondary | ICD-10-CM | POA: Diagnosis not present

## 2022-06-18 DIAGNOSIS — M7989 Other specified soft tissue disorders: Secondary | ICD-10-CM

## 2022-06-18 DIAGNOSIS — I442 Atrioventricular block, complete: Secondary | ICD-10-CM

## 2022-06-18 DIAGNOSIS — N289 Disorder of kidney and ureter, unspecified: Secondary | ICD-10-CM | POA: Diagnosis not present

## 2022-06-18 DIAGNOSIS — I4891 Unspecified atrial fibrillation: Secondary | ICD-10-CM | POA: Diagnosis not present

## 2022-06-18 DIAGNOSIS — R000 Tachycardia, unspecified: Secondary | ICD-10-CM | POA: Diagnosis not present

## 2022-06-18 DIAGNOSIS — E860 Dehydration: Secondary | ICD-10-CM | POA: Diagnosis not present

## 2022-06-18 DIAGNOSIS — N179 Acute kidney failure, unspecified: Secondary | ICD-10-CM | POA: Diagnosis not present

## 2022-06-18 DIAGNOSIS — Z743 Need for continuous supervision: Secondary | ICD-10-CM | POA: Diagnosis not present

## 2022-06-18 NOTE — Progress Notes (Unsigned)
Cardiology Office Note:    Date:  06/18/2022   ID:  Michael Aguirre, DOB 02/12/1941, MRN [id number]  PCP:  [name], MD  Cardiologist:  Jenne Campus, MD    Referring MD: [name], MD   No chief complaint on file. I was in the hospital  History of Present Illness:    Michael Aguirre is a 82 y.o. 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  He was given some fluids, his dose of lisinopril has been reduced from 40 mg to 5 mg daily.  He came today to my office for follow-up.  Interestingly after I finished visit and everything was fine he was doing okay he started having similar episodes BPM started he going for stent he started sweating became pale and his blood pressure dropped to 70 systolic.  He was still conscious.  But his oxygen saturation was 85%, EKG showed normal sinus rhythm rate 65, normal QS complex duration fulgent no ST segment changes.  He was lower down in the bed.  Then restarted IV line EMS was summoned, blood pressure gradually increased.  We started giving some IV fluids.  He did not have any chest pain tightness squeezing pressure burning chest, he complained of having some left arm pain but he said that this pain is there all the time.  Past Medical History:  Diagnosis Date   CLL (chronic lymphocytic leukemia) ([place])    Sciatic nerve pain     Past Surgical History:  Procedure Laterality Date   INSERT / REPLACE / REMOVE PACEMAKER     [employer]    Current Medications: Current Meds  Medication Sig   ALPRAZolam (XANAX) 0.5 MG tablet TAKE 1 TABLET BY MOUTH TWICE  DAILY AS NEEDED FOR ANXIETY    amLODipine (NORVASC) 10 MG tablet Take 1 tablet (10 mg total) by mouth daily.   CALQUENCE 100 MG capsule Take by mouth.   chlorpheniramine-HYDROcodone (TUSSIONEX) 10-8 MG/5ML SUER Take 5 mLs by mouth every 12 (twelve) hours as needed for cough.   [employer] ANTACID/ANTI-GAS 200-200-20 MG/5ML suspension Take by mouth.   finasteride (PROSCAR) 5 MG tablet Take 5 mg by mouth daily.   furosemide (LASIX) 20 MG tablet Take 20 mg by mouth as needed.    isosorbide mononitrate (IMDUR) 30 MG 24 hr tablet Take 1 tablet (30 mg total) by mouth daily.   levothyroxine (SYNTHROID) 50 MCG tablet Take 50 mcg by mouth daily before breakfast.   mupirocin ointment (BACTROBAN) 2 % Apply 1 application topically 2 (two) times daily.   nitroGLYCERIN (NITROSTAT) 0.4 MG SL tablet Place 1 tablet (0.4 mg total) under the tongue every 5 (five) minutes as needed for chest pain.   ondansetron (ZOFRAN) 8 MG tablet Take 8 mg by mouth as needed for nausea or vomiting.   tamsulosin (FLOMAX) 0.4 MG CAPS capsule Take 0.8 mg by mouth daily.   [DISCONTINUED] lisinopril (ZESTRIL) 5 MG tablet Take 5 mg by mouth daily.     Allergies:   Tetracyclines & related, Isosorbide, Tetracycline, and Other   Social History   Socioeconomic History   Marital status: Married    Spouse name: Not on file   Number  of children: Not on file   Years of education: Not on file   Highest education level: Not on file  Occupational History   Not on file  Tobacco Use   Smoking status: Never   Smokeless tobacco: Never  Vaping Use   Vaping Use: Never used  Substance and Sexual Activity   Alcohol use: Not Currently   Drug use: Never   Sexual activity: Not on file  Other Topics Concern   Not on file  Social History Narrative   Not on file   Social Determinants of Health   Financial Resource Strain: Not on file  Food Insecurity: Not on file  Transportation Needs: Not on file  Physical Activity: Not on file  Stress: Not on file  Social Connections:  Not on file     Family History: The patient's family history includes Colon cancer in his brother; Hypertension in his father and mother; Throat cancer in his sister. ROS:   Please see the history of present illness.    All 14 point review of systems negative except as described per history of present illness  EKGs/Labs/Other Studies Reviewed:      Recent Labs: 03/08/2022: Hemoglobin 11.1; Platelets 123  Recent Lipid Panel    Component Value Date/Time   CHOL 217 (H) 05/24/2018 1704   TRIG 217 (H) 05/24/2018 1704   HDL 35 (L) 05/24/2018 1704   CHOLHDL 6.2 (H) 05/24/2018 1704   LDLCALC 139 (H) 05/24/2018 1704    Physical Exam:    VS:  BP 110/62 (BP Location: Left Arm, Patient Position: Sitting, Cuff Size: Normal)   Pulse 75   Ht '5\' 7"'$  (1.702 m)   Wt 181 lb (82.1 kg)   SpO2 97%   BMI 28.35 kg/m     Wt Readings from Last 3 Encounters:  06/18/22 181 lb (82.1 kg)  05/26/22 182 lb (82.6 kg)  05/10/22 181 lb (82.1 kg)     GEN:  Well nourished, well developed in no acute distress HEENT: Normal NECK: No JVD; No carotid bruits LYMPHATICS: No lymphadenopathy CARDIAC: RRR, no murmurs, no rubs, no gallops RESPIRATORY:  Clear to auscultation without rales, wheezing or rhonchi  ABDOMEN: Soft, non-tender, non-distended MUSCULOSKELETAL:  No edema; No deformity  SKIN: Warm and dry LOWER EXTREMITIES: no swelling NEUROLOGIC:  Alert and oriented x 3 PSYCHIATRIC:  Normal affect   ASSESSMENT:    1. Paroxysmal atrial fibrillation (HCC)   2. Transient complete heart block (HCC)   3. Pacemaker   4. CLL (chronic lymphocytic leukemia) ([place])   5. Swelling of both lower extremities    PLAN:    In order of problems listed above:  Episodes of hypotension look like vagal reaction however combination of medication he is being on he is being on this for a long time, interestingly recently his lisinopril has been reduced from 40 to 25 mg.  Ask his wife to completely discontinue that  medication.  He will be going to the emergency room.  He will require some fluids.  However long-term explanation for this behavior is still somewhat unclear.  The truth is that he did not eat did not drink anything today because from our office he is supposed to go to office of Dr. Yancey Flemings to have some blood test done.  That may be contributing.  For now he will get rehydrated in the emergency room, I will discontinue his lisinopril, will encourage him to drink plenty of fluids.  As an outpatient I will consider CAD workup  since he did have a stress test done in 2020 which was negative I still have no good explanation for his behavior.  I am worried about potentially having significant coronary artery disease. Pacemaker present interrogated after previous episodes.  5.5 years left in the device, normal function, he did have a few mode switch and last one recorded was in June 15, 2022 but lasting only 50 seconds. CLL noted, will do CBC while he is in the emergency room.   Medication Adjustments/Labs and Tests Ordered: Current medicines are reviewed at length with the patient today.  Concerns regarding medicines are outlined above.  Orders Placed This Encounter  Procedures   EKG 12-Lead   Medication changes: No orders of the defined types were placed in this encounter.   Signed, Park Liter, MD, [place] 06/18/2022 12:15 PM    [facility]

## 2022-06-18 NOTE — Patient Instructions (Addendum)
Medication Instructions:  Your physician has recommended you make the following change in your medication:   STOP: Lisinopril  *If you need a refill on your cardiac medications before your next appointment, please call your pharmacy*   Lab Work: None If you have labs (blood work) drawn today and your tests are completely normal, you will receive your results only by: [place] (if you have MyChart) OR A paper copy in the mail If you have any lab test that is abnormal or we need to change your treatment, we will call you to review the results.   Testing/Procedures: None   Follow-Up: At [facility], you and your health needs are our priority.  As part of our continuing mission to provide you with exceptional heart care, we have created designated Provider Care Teams.  These Care Teams include your primary Cardiologist (physician) and Advanced Practice Providers (APPs -  Physician Assistants and Nurse Practitioners) who all work together to provide you with the care you need, when you need it.  We recommend signing up for the patient portal called "MyChart".  Sign up information is provided on this After Visit Summary.  MyChart is used to connect with patients for Virtual Visits (Telemedicine).  Patients are able to view lab/test results, encounter notes, upcoming appointments, etc.  Non-urgent messages can be sent to your provider as well.   To learn more about what you can do with MyChart, go to [email or web address].    Your next appointment:   Keep appointment on 06/29/22 with Dr. Agustin Cree  Provider:   Jenne Campus, MD    Other Instructions None

## 2022-06-23 DIAGNOSIS — C9110 Chronic lymphocytic leukemia of B-cell type not having achieved remission: Secondary | ICD-10-CM | POA: Diagnosis not present

## 2022-06-23 DIAGNOSIS — Z0000 Encounter for general adult medical examination without abnormal findings: Secondary | ICD-10-CM | POA: Diagnosis not present

## 2022-06-23 DIAGNOSIS — I480 Paroxysmal atrial fibrillation: Secondary | ICD-10-CM | POA: Diagnosis not present

## 2022-06-23 DIAGNOSIS — Z6826 Body mass index (BMI) 26.0-26.9, adult: Secondary | ICD-10-CM | POA: Diagnosis not present

## 2022-06-23 DIAGNOSIS — I10 Essential (primary) hypertension: Secondary | ICD-10-CM | POA: Diagnosis not present

## 2022-06-29 ENCOUNTER — Ambulatory Visit: Payer: Medicare HMO | Attending: Cardiology | Admitting: Cardiology

## 2022-06-30 ENCOUNTER — Encounter: Payer: Self-pay | Admitting: Cardiology

## 2022-07-08 NOTE — Progress Notes (Signed)
Remote pacemaker transmission.   

## 2022-07-09 ENCOUNTER — Inpatient Hospital Stay: Payer: Medicare HMO

## 2022-07-09 ENCOUNTER — Inpatient Hospital Stay: Payer: Medicare HMO | Attending: Oncology | Admitting: Oncology

## 2022-07-09 VITALS — BP 163/71 | HR 69 | Temp 98.8°F | Resp 16 | Ht 67.0 in | Wt 180.1 lb

## 2022-07-09 DIAGNOSIS — C9110 Chronic lymphocytic leukemia of B-cell type not having achieved remission: Secondary | ICD-10-CM

## 2022-07-09 DIAGNOSIS — D649 Anemia, unspecified: Secondary | ICD-10-CM | POA: Diagnosis not present

## 2022-07-09 LAB — CBC: RBC: 3.42 — AB (ref 3.87–5.11)

## 2022-07-09 LAB — CBC AND DIFFERENTIAL
HCT: 32 — AB (ref 41–53)
Hemoglobin: 10.6 — AB (ref 13.5–17.5)
Neutrophils Absolute: 2.51
Platelets: 141 10*3/uL — AB (ref 150–400)
WBC: 11.4

## 2022-07-09 NOTE — Progress Notes (Unsigned)
[facility]  [address] [place],  [place]  [address] [phone]  Clinic Day:  07/09/2022  Referring physician: [name], MD  HISTORY OF PRESENT ILLNESS:  The patient is an 82 y.o. 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  Due to his disease leading to significant anemia, he was placed on acalabrutinib 100 mg twice daily in late July 2022.  He comes in today to reassess his peripheral counts.  Since his last visit, the patient has been doing okay.  He claims his fatigue continues to improve.  He denies having any B symptoms or bulky lymphadenopathy which concerns him for catabolic disease progression while on acalabrutinib.    VITALS:  Blood pressure (!) 163/71, pulse 69, temperature 98.8 F (37.1 C), resp. rate 16, height '5\' 7"'$  (1.702 m), weight 180 lb 1.6 oz (81.7 kg), SpO2 96 %.  Wt Readings from Last 3 Encounters:  07/09/22 180 lb 1.6 oz (81.7 kg)  06/18/22 181 lb (82.1 kg)  05/26/22 182 lb (82.6 kg)    Body mass index is 28.21 kg/m.  Performance status (ECOG): 2 - Symptomatic, <50% confined to bed  PHYSICAL EXAM:  Physical Exam Constitutional:      General: He is not in acute distress.    Appearance: Normal appearance. He is normal weight.  HENT:     Head: Normocephalic and atraumatic.  Eyes:     General: No scleral icterus.    Extraocular Movements: Extraocular movements intact.     Conjunctiva/sclera: Conjunctivae normal.     Pupils: Pupils are equal, round, and reactive to light.  Cardiovascular:     Rate and Rhythm: Regular rhythm. Bradycardia present.     Pulses: Normal pulses.     Heart sounds: Normal heart sounds. No murmur heard.    No friction rub. No gallop.  Pulmonary:     Effort: Pulmonary effort is normal. No respiratory distress.     Breath sounds: Normal breath sounds.  Abdominal:     General: Bowel sounds are normal. There is no  distension.     Palpations: Abdomen is soft. There is no hepatomegaly, splenomegaly or mass.     Tenderness: There is no abdominal tenderness.  Musculoskeletal:        General: Normal range of motion.     Cervical back: Normal range of motion and neck supple.     Right lower leg: No edema.     Left lower leg: No edema.  Lymphadenopathy:     Cervical: No cervical adenopathy.  Skin:    General: Skin is warm and dry.  Neurological:     General: No focal deficit present.     Mental Status: He is alert and oriented to person, place, and time. Mental status is at baseline.  Psychiatric:        Mood and Affect: Mood normal.        Behavior: Behavior normal.        Thought Content: Thought content normal.        Judgment: Judgment normal.    LABS:    ASSESSMENT & PLAN:  Assessment/Plan:  An 82 year old gentleman with chronic lymphocytic leukemia.  I am pleased as his white count continues to fall. There is nothing per his labs or physical exam which concerns me for progression of his CLL.  He knows to continue taking his acalabrutinib at 100 mg twice daily.  Clinically, he is  doing well.  I will see him back in 4 months for repeat clinical assessment.  The patient understands all the plans discussed today and is in agreement with them.    Monice Lundy Macarthur Critchley, MD

## 2022-07-11 ENCOUNTER — Encounter: Payer: Self-pay | Admitting: Hematology and Oncology

## 2022-08-13 DIAGNOSIS — Z125 Encounter for screening for malignant neoplasm of prostate: Secondary | ICD-10-CM | POA: Diagnosis not present

## 2022-08-13 DIAGNOSIS — R351 Nocturia: Secondary | ICD-10-CM | POA: Diagnosis not present

## 2022-08-13 DIAGNOSIS — N401 Enlarged prostate with lower urinary tract symptoms: Secondary | ICD-10-CM | POA: Diagnosis not present

## 2022-08-13 DIAGNOSIS — R3129 Other microscopic hematuria: Secondary | ICD-10-CM | POA: Diagnosis not present

## 2022-08-19 DIAGNOSIS — L578 Other skin changes due to chronic exposure to nonionizing radiation: Secondary | ICD-10-CM | POA: Diagnosis not present

## 2022-08-19 DIAGNOSIS — L820 Inflamed seborrheic keratosis: Secondary | ICD-10-CM | POA: Diagnosis not present

## 2022-08-19 DIAGNOSIS — L570 Actinic keratosis: Secondary | ICD-10-CM | POA: Diagnosis not present

## 2022-08-19 DIAGNOSIS — L821 Other seborrheic keratosis: Secondary | ICD-10-CM | POA: Diagnosis not present

## 2022-08-19 DIAGNOSIS — C4442 Squamous cell carcinoma of skin of scalp and neck: Secondary | ICD-10-CM | POA: Diagnosis not present

## 2022-08-19 DIAGNOSIS — C44311 Basal cell carcinoma of skin of nose: Secondary | ICD-10-CM | POA: Diagnosis not present

## 2022-08-26 DIAGNOSIS — K5730 Diverticulosis of large intestine without perforation or abscess without bleeding: Secondary | ICD-10-CM | POA: Diagnosis not present

## 2022-08-26 DIAGNOSIS — R3129 Other microscopic hematuria: Secondary | ICD-10-CM | POA: Diagnosis not present

## 2022-09-07 DIAGNOSIS — R351 Nocturia: Secondary | ICD-10-CM | POA: Diagnosis not present

## 2022-09-07 DIAGNOSIS — R3129 Other microscopic hematuria: Secondary | ICD-10-CM | POA: Diagnosis not present

## 2022-09-07 DIAGNOSIS — N401 Enlarged prostate with lower urinary tract symptoms: Secondary | ICD-10-CM | POA: Diagnosis not present

## 2022-09-15 ENCOUNTER — Ambulatory Visit (INDEPENDENT_AMBULATORY_CARE_PROVIDER_SITE_OTHER): Payer: Medicare HMO

## 2022-09-15 DIAGNOSIS — I442 Atrioventricular block, complete: Secondary | ICD-10-CM

## 2022-09-17 LAB — CUP PACEART REMOTE DEVICE CHECK
Battery Impedance: 1481 Ohm
Battery Remaining Longevity: 62 mo
Battery Voltage: 2.77 V
Brady Statistic AP VP Percent: 1 %
Brady Statistic AP VS Percent: 0 %
Brady Statistic AS VP Percent: 1 %
Brady Statistic AS VS Percent: 97 %
Date Time Interrogation Session: 20240412102719
Implantable Lead Connection Status: 753985
Implantable Lead Connection Status: 753985
Implantable Lead Implant Date: 20020322
Implantable Lead Implant Date: 20030322
Implantable Lead Location: 753859
Implantable Lead Location: 753860
Implantable Lead Model: 5092
Implantable Lead Model: 5592
Implantable Pulse Generator Implant Date: 20110526
Lead Channel Impedance Value: 421 Ohm
Lead Channel Impedance Value: 443 Ohm
Lead Channel Pacing Threshold Amplitude: 0.875 V
Lead Channel Pacing Threshold Amplitude: 1 V
Lead Channel Pacing Threshold Pulse Width: 0.4 ms
Lead Channel Pacing Threshold Pulse Width: 0.4 ms
Lead Channel Setting Pacing Amplitude: 2 V
Lead Channel Setting Pacing Amplitude: 2 V
Lead Channel Setting Pacing Pulse Width: 0.4 ms
Lead Channel Setting Sensing Sensitivity: 2 mV
Zone Setting Status: 755011
Zone Setting Status: 755011

## 2022-10-22 NOTE — Progress Notes (Signed)
Remote pacemaker transmission.   

## 2022-11-09 ENCOUNTER — Ambulatory Visit: Payer: Medicare HMO | Admitting: Oncology

## 2022-11-09 ENCOUNTER — Other Ambulatory Visit: Payer: Medicare HMO

## 2022-11-12 ENCOUNTER — Other Ambulatory Visit: Payer: Self-pay | Admitting: Oncology

## 2022-11-12 ENCOUNTER — Inpatient Hospital Stay: Payer: Medicare HMO | Attending: Oncology

## 2022-11-12 ENCOUNTER — Inpatient Hospital Stay: Payer: Medicare HMO | Admitting: Oncology

## 2022-11-12 VITALS — BP 129/62 | HR 61 | Temp 98.1°F | Resp 16 | Ht 67.0 in | Wt 178.5 lb

## 2022-11-12 DIAGNOSIS — D631 Anemia in chronic kidney disease: Secondary | ICD-10-CM | POA: Diagnosis not present

## 2022-11-12 DIAGNOSIS — D649 Anemia, unspecified: Secondary | ICD-10-CM | POA: Diagnosis not present

## 2022-11-12 DIAGNOSIS — C9110 Chronic lymphocytic leukemia of B-cell type not having achieved remission: Secondary | ICD-10-CM

## 2022-11-12 DIAGNOSIS — N189 Chronic kidney disease, unspecified: Secondary | ICD-10-CM | POA: Diagnosis not present

## 2022-11-12 LAB — CMP (CANCER CENTER ONLY)
ALT: 14 U/L (ref 0–44)
AST: 14 U/L — ABNORMAL LOW (ref 15–41)
Albumin: 3.9 g/dL (ref 3.5–5.0)
Alkaline Phosphatase: 41 U/L (ref 38–126)
Anion gap: 7 (ref 5–15)
BUN: 26 mg/dL — ABNORMAL HIGH (ref 8–23)
CO2: 27 mmol/L (ref 22–32)
Calcium: 8.9 mg/dL (ref 8.9–10.3)
Chloride: 106 mmol/L (ref 98–111)
Creatinine: 1.35 mg/dL — ABNORMAL HIGH (ref 0.61–1.24)
GFR, Estimated: 52 mL/min — ABNORMAL LOW (ref >60)
Glucose, Bld: 107 mg/dL — ABNORMAL HIGH (ref 70–99)
Potassium: 4.1 mmol/L (ref 3.5–5.1)
Sodium: 140 mmol/L (ref 135–145)
Total Bilirubin: 0.4 mg/dL (ref 0.3–1.2)
Total Protein: 6.4 g/dL — ABNORMAL LOW (ref 6.5–8.1)

## 2022-11-12 LAB — CBC AND DIFFERENTIAL
HCT: 31 — AB (ref 41–53)
Hemoglobin: 10.1 — AB (ref 13.5–17.5)
Neutrophils Absolute: 4.09
Platelets: 135 10*3/uL — AB (ref 150–400)
WBC: 12.4

## 2022-11-12 LAB — IRON AND TIBC
Iron: 64 ug/dL (ref 45–182)
Saturation Ratios: 19 % (ref 17.9–39.5)
TIBC: 340 ug/dL (ref 250–450)
UIBC: 276 ug/dL

## 2022-11-12 LAB — FERRITIN: Ferritin: 14 ng/mL — ABNORMAL LOW (ref 24–336)

## 2022-11-12 LAB — VITAMIN B12: Vitamin B-12: 235 pg/mL (ref 180–914)

## 2022-11-12 LAB — CBC: RBC: 3.35 — AB (ref 3.87–5.11)

## 2022-11-12 LAB — FOLATE: Folate: 6.5 ng/mL (ref >5.9)

## 2022-11-12 NOTE — Progress Notes (Unsigned)
[facility]  [address] [place],  [place]  [address] [phone]  Clinic Day:  11/12/2022  Referring physician: Lise Auer, MD  HISTORY OF PRESENT ILLNESS:  The patient is an 82 y.o. 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  Due to his disease leading to significant anemia, he was placed on acalabrutinib 100 mg twice daily in late July 2022.  He comes in today to reassess his peripheral counts.  Since his last visit, the patient has been doing okay.  He claims his fatigue continues to improve.  He denies having any B symptoms or bulky lymphadenopathy which concerns him for catabolic disease progression while on acalabrutinib.    VITALS:  Blood pressure 129/62, pulse 61, temperature 98.1 F (36.7 C), resp. rate 16, height 5\' 7"  (1.702 m), weight 178 lb 8 oz (81 kg), SpO2 95 %.  Wt Readings from Last 3 Encounters:  11/12/22 178 lb 8 oz (81 kg)  07/09/22 180 lb 1.6 oz (81.7 kg)  06/18/22 181 lb (82.1 kg)    Body mass index is 27.96 kg/m.  Performance status (ECOG): 2 - Symptomatic, <50% confined to bed  PHYSICAL EXAM:  Physical Exam Constitutional:      General: He is not in acute distress.    Appearance: Normal appearance. He is normal weight.  HENT:     Head: Normocephalic and atraumatic.  Eyes:     General: No scleral icterus.    Extraocular Movements: Extraocular movements intact.     Conjunctiva/sclera: Conjunctivae normal.     Pupils: Pupils are equal, round, and reactive to light.  Cardiovascular:     Rate and Rhythm: Regular rhythm. Bradycardia present.     Pulses: Normal pulses.     Heart sounds: Normal heart sounds. No murmur heard.    No friction rub. No gallop.  Pulmonary:     Effort: Pulmonary effort is normal. No respiratory distress.     Breath sounds: Normal breath sounds.  Abdominal:     General: Bowel sounds are normal. There is no distension.      Palpations: Abdomen is soft. There is no hepatomegaly, splenomegaly or mass.     Tenderness: There is no abdominal tenderness.  Musculoskeletal:        General: Normal range of motion.     Cervical back: Normal range of motion and neck supple.     Right lower leg: No edema.     Left lower leg: No edema.  Lymphadenopathy:     Cervical: No cervical adenopathy.  Skin:    General: Skin is warm and dry.  Neurological:     General: No focal deficit present.     Mental Status: He is alert and oriented to person, place, and time. Mental status is at baseline.  Psychiatric:        Mood and Affect: Mood normal.        Behavior: Behavior normal.        Thought Content: Thought content normal.        Judgment: Judgment normal.   LABS:     Latest Reference Range & Units 11/12/22 15:46  Iron 45 - 182 ug/dL 64  UIBC ug/dL 914  TIBC 782 - 956 ug/dL 213  Saturation Ratios 17.9 - 39.5 % 19  Ferritin 24 - 336 ng/mL 14 (L)  Folate >5.9 ng/mL 6.5  Vitamin B12 180 - 914 pg/mL 235  (L): Data  is abnormally low   Latest Reference Range & Units 11/12/22 14:37  Sodium 135 - 145 mmol/L 140  Potassium 3.5 - 5.1 mmol/L 4.1  Chloride 98 - 111 mmol/L 106  CO2 22 - 32 mmol/L 27  Glucose 70 - 99 mg/dL 782 (H)  BUN 8 - 23 mg/dL 26 (H)  Creatinine 9.56 - 1.24 mg/dL 2.13 (H)  Calcium 8.9 - 10.3 mg/dL 8.9  Anion gap 5 - 15  7  Alkaline Phosphatase 38 - 126 U/L 41  Albumin 3.5 - 5.0 g/dL 3.9  AST 15 - 41 U/L 14 (L)  ALT 0 - 44 U/L 14  Total Protein 6.5 - 8.1 g/dL 6.4 (L)  Total Bilirubin 0.3 - 1.2 mg/dL 0.4  GFR, Est Non African American >60 mL/min 52 (L)  (H): Data is abnormally high (L): Data is abnormally low  ASSESSMENT & PLAN:  Assessment/Plan:  An 82 year old gentleman with chronic lymphocytic leukemia.   Overall, his white count remains minimally elevated, but much better than what it has been previously.  There is nothing per his labs or physical exam which concerns me for progression of his  CLL.  He knows to continue taking his acalabrutinib at 100 mg twice daily.  With respect to his anemia, it appears to be multifactorial.  In addition to having kidney disease, his ferritin is low, which suggests depleted iron stores.  Based upon this, I will arrange for him to receive IV over these next few week to improve his iron and hemoglobin levels.  Clinically, he is doing well.  I will see him back in 4 months for repeat clinical assessment.  The patient understands all the plans discussed today and is in agreement with them.    Iwao Shamblin Kirby Funk, MD

## 2022-11-15 ENCOUNTER — Other Ambulatory Visit: Payer: Self-pay | Admitting: Oncology

## 2022-11-15 ENCOUNTER — Encounter: Payer: Self-pay | Admitting: Oncology

## 2022-11-15 DIAGNOSIS — D508 Other iron deficiency anemias: Secondary | ICD-10-CM

## 2022-11-16 ENCOUNTER — Encounter: Payer: Self-pay | Admitting: Oncology

## 2022-11-16 DIAGNOSIS — D509 Iron deficiency anemia, unspecified: Secondary | ICD-10-CM | POA: Insufficient documentation

## 2022-11-16 HISTORY — DX: Iron deficiency anemia, unspecified: D50.9

## 2022-11-22 ENCOUNTER — Other Ambulatory Visit: Payer: Self-pay

## 2022-11-22 DIAGNOSIS — J0120 Acute ethmoidal sinusitis, unspecified: Secondary | ICD-10-CM | POA: Diagnosis not present

## 2022-11-22 DIAGNOSIS — R062 Wheezing: Secondary | ICD-10-CM | POA: Diagnosis not present

## 2022-11-22 MED FILL — Iron Sucrose Inj 20 MG/ML (Fe Equiv): INTRAVENOUS | Qty: 10 | Status: AC

## 2022-11-22 NOTE — Telephone Encounter (Signed)
MMW ingredients: 3 part Mylanta, 2 part benadryl, 1 part lidocaine Sig: 5ml q 3hr PRN mouth/throat pain disp 2 refills per Dr Melvyn Neth called [facility] [address] [place].

## 2022-11-23 ENCOUNTER — Inpatient Hospital Stay: Payer: Medicare HMO

## 2022-11-23 VITALS — BP 146/65 | HR 66 | Temp 98.1°F | Resp 18 | Ht 67.0 in | Wt 180.1 lb

## 2022-11-23 DIAGNOSIS — C9110 Chronic lymphocytic leukemia of B-cell type not having achieved remission: Secondary | ICD-10-CM | POA: Diagnosis not present

## 2022-11-23 DIAGNOSIS — D649 Anemia, unspecified: Secondary | ICD-10-CM | POA: Diagnosis not present

## 2022-11-23 DIAGNOSIS — N189 Chronic kidney disease, unspecified: Secondary | ICD-10-CM

## 2022-11-23 DIAGNOSIS — D508 Other iron deficiency anemias: Secondary | ICD-10-CM

## 2022-11-23 MED ORDER — SODIUM CHLORIDE 0.9 % IV SOLN
200.0000 mg | Freq: Once | INTRAVENOUS | Status: AC
  Administered 2022-11-23: 200 mg via INTRAVENOUS
  Filled 2022-11-23: qty 200

## 2022-11-23 MED ORDER — SODIUM CHLORIDE 0.9 % IV SOLN: Freq: Once | INTRAVENOUS | Status: AC

## 2022-11-23 NOTE — Patient Instructions (Signed)

## 2022-11-24 ENCOUNTER — Other Ambulatory Visit: Payer: Self-pay | Admitting: Hematology and Oncology

## 2022-11-24 MED FILL — Iron Sucrose Inj 20 MG/ML (Fe Equiv): INTRAVENOUS | Qty: 10 | Status: AC

## 2022-11-25 ENCOUNTER — Inpatient Hospital Stay: Payer: Medicare HMO

## 2022-11-25 VITALS — BP 143/63 | HR 73 | Temp 98.4°F | Resp 14 | Ht 67.0 in | Wt 180.1 lb

## 2022-11-25 DIAGNOSIS — D631 Anemia in chronic kidney disease: Secondary | ICD-10-CM

## 2022-11-25 DIAGNOSIS — D649 Anemia, unspecified: Secondary | ICD-10-CM | POA: Diagnosis not present

## 2022-11-25 DIAGNOSIS — D508 Other iron deficiency anemias: Secondary | ICD-10-CM

## 2022-11-25 DIAGNOSIS — C9110 Chronic lymphocytic leukemia of B-cell type not having achieved remission: Secondary | ICD-10-CM | POA: Diagnosis not present

## 2022-11-25 MED ORDER — SODIUM CHLORIDE 0.9 % IV SOLN
200.0000 mg | Freq: Once | INTRAVENOUS | Status: AC
  Administered 2022-11-25: 200 mg via INTRAVENOUS
  Filled 2022-11-25: qty 200

## 2022-11-25 MED ORDER — SODIUM CHLORIDE 0.9 % IV SOLN: Freq: Once | INTRAVENOUS | Status: DC | PRN

## 2022-11-25 NOTE — Patient Instructions (Signed)

## 2022-11-26 MED FILL — Iron Sucrose Inj 20 MG/ML (Fe Equiv): INTRAVENOUS | Qty: 10 | Status: AC

## 2022-11-29 ENCOUNTER — Inpatient Hospital Stay: Payer: Medicare HMO

## 2022-11-29 VITALS — BP 157/75 | HR 55 | Temp 97.8°F | Resp 16 | Ht 67.0 in | Wt 179.0 lb

## 2022-11-29 DIAGNOSIS — D631 Anemia in chronic kidney disease: Secondary | ICD-10-CM

## 2022-11-29 DIAGNOSIS — D649 Anemia, unspecified: Secondary | ICD-10-CM | POA: Diagnosis not present

## 2022-11-29 DIAGNOSIS — D508 Other iron deficiency anemias: Secondary | ICD-10-CM

## 2022-11-29 DIAGNOSIS — C9110 Chronic lymphocytic leukemia of B-cell type not having achieved remission: Secondary | ICD-10-CM | POA: Diagnosis not present

## 2022-11-29 MED ORDER — SODIUM CHLORIDE 0.9 % IV SOLN
200.0000 mg | Freq: Once | INTRAVENOUS | Status: AC
  Administered 2022-11-29: 200 mg via INTRAVENOUS
  Filled 2022-11-29: qty 200

## 2022-11-29 MED ORDER — SODIUM CHLORIDE 0.9 % IV SOLN: Freq: Once | INTRAVENOUS | Status: AC

## 2022-11-29 NOTE — Patient Instructions (Signed)

## 2022-11-30 MED FILL — Iron Sucrose Inj 20 MG/ML (Fe Equiv): INTRAVENOUS | Qty: 10 | Status: AC

## 2022-12-01 ENCOUNTER — Inpatient Hospital Stay: Payer: Medicare HMO

## 2022-12-01 VITALS — BP 171/79 | HR 58 | Temp 98.0°F | Resp 18

## 2022-12-01 DIAGNOSIS — D508 Other iron deficiency anemias: Secondary | ICD-10-CM

## 2022-12-01 DIAGNOSIS — D649 Anemia, unspecified: Secondary | ICD-10-CM | POA: Diagnosis not present

## 2022-12-01 DIAGNOSIS — C9110 Chronic lymphocytic leukemia of B-cell type not having achieved remission: Secondary | ICD-10-CM | POA: Diagnosis not present

## 2022-12-01 DIAGNOSIS — D631 Anemia in chronic kidney disease: Secondary | ICD-10-CM

## 2022-12-01 MED ORDER — SODIUM CHLORIDE 0.9 % IV SOLN: Freq: Once | INTRAVENOUS | Status: AC

## 2022-12-01 MED ORDER — SODIUM CHLORIDE 0.9 % IV SOLN
200.0000 mg | Freq: Once | INTRAVENOUS | Status: AC
  Administered 2022-12-01: 200 mg via INTRAVENOUS
  Filled 2022-12-01: qty 200

## 2022-12-01 NOTE — Patient Instructions (Signed)

## 2022-12-02 MED FILL — Iron Sucrose Inj 20 MG/ML (Fe Equiv): INTRAVENOUS | Qty: 10 | Status: AC

## 2022-12-03 ENCOUNTER — Telehealth: Payer: Self-pay

## 2022-12-03 ENCOUNTER — Inpatient Hospital Stay: Payer: Medicare HMO

## 2022-12-03 NOTE — Telephone Encounter (Signed)
Spoke with Michael Aguirre, told him that if pain or swelling increases that he will need to present to urgent care or emergency room.  If still an issue on Monday reach out to PCP.  Wife acknowledge agreement with recommendations.  Dr. Melvyn Neth aware.

## 2022-12-13 ENCOUNTER — Encounter: Payer: Self-pay | Admitting: Oncology

## 2022-12-13 ENCOUNTER — Other Ambulatory Visit (HOSPITAL_COMMUNITY): Payer: Self-pay

## 2022-12-13 MED FILL — Iron Sucrose Inj 20 MG/ML (Fe Equiv): INTRAVENOUS | Qty: 10 | Status: AC

## 2022-12-14 ENCOUNTER — Inpatient Hospital Stay: Payer: Medicare HMO | Attending: Oncology

## 2022-12-14 VITALS — BP 155/68 | HR 74 | Temp 98.2°F | Resp 18 | Ht 67.0 in | Wt 178.1 lb

## 2022-12-14 DIAGNOSIS — N189 Chronic kidney disease, unspecified: Secondary | ICD-10-CM

## 2022-12-14 DIAGNOSIS — D649 Anemia, unspecified: Secondary | ICD-10-CM | POA: Diagnosis not present

## 2022-12-14 DIAGNOSIS — D508 Other iron deficiency anemias: Secondary | ICD-10-CM

## 2022-12-14 MED ORDER — SODIUM CHLORIDE 0.9 % IV SOLN: Freq: Once | INTRAVENOUS | Status: AC

## 2022-12-14 MED ORDER — SODIUM CHLORIDE 0.9 % IV SOLN
200.0000 mg | Freq: Once | INTRAVENOUS | Status: AC
  Administered 2022-12-14: 200 mg via INTRAVENOUS
  Filled 2022-12-14: qty 200

## 2022-12-14 NOTE — Patient Instructions (Signed)

## 2022-12-28 DIAGNOSIS — L219 Seborrheic dermatitis, unspecified: Secondary | ICD-10-CM | POA: Diagnosis not present

## 2022-12-28 DIAGNOSIS — L570 Actinic keratosis: Secondary | ICD-10-CM | POA: Diagnosis not present

## 2023-01-03 ENCOUNTER — Ambulatory Visit (INDEPENDENT_AMBULATORY_CARE_PROVIDER_SITE_OTHER): Payer: Medicare HMO

## 2023-01-03 ENCOUNTER — Telehealth: Payer: Self-pay | Admitting: Cardiology

## 2023-01-03 DIAGNOSIS — I442 Atrioventricular block, complete: Secondary | ICD-10-CM

## 2023-01-03 NOTE — Telephone Encounter (Signed)
Pt's wife calling to ask if she can transmit or if she needs to wait on a call. Please advise.

## 2023-01-03 NOTE — Telephone Encounter (Signed)
Spoke with Michael Aguirre in the Device clinic and she stated that it was ok for the patient to send a transmission at this time. I informed the patient of this and she verbalized understanding and had no further questions at this time.

## 2023-01-20 DIAGNOSIS — F419 Anxiety disorder, unspecified: Secondary | ICD-10-CM | POA: Diagnosis not present

## 2023-01-20 DIAGNOSIS — E039 Hypothyroidism, unspecified: Secondary | ICD-10-CM | POA: Diagnosis not present

## 2023-01-20 DIAGNOSIS — I4891 Unspecified atrial fibrillation: Secondary | ICD-10-CM | POA: Diagnosis not present

## 2023-01-20 DIAGNOSIS — I25119 Atherosclerotic heart disease of native coronary artery with unspecified angina pectoris: Secondary | ICD-10-CM | POA: Diagnosis not present

## 2023-01-20 DIAGNOSIS — I130 Hypertensive heart and chronic kidney disease with heart failure and stage 1 through stage 4 chronic kidney disease, or unspecified chronic kidney disease: Secondary | ICD-10-CM | POA: Diagnosis not present

## 2023-01-20 DIAGNOSIS — I509 Heart failure, unspecified: Secondary | ICD-10-CM | POA: Diagnosis not present

## 2023-01-20 DIAGNOSIS — R32 Unspecified urinary incontinence: Secondary | ICD-10-CM | POA: Diagnosis not present

## 2023-01-20 DIAGNOSIS — N400 Enlarged prostate without lower urinary tract symptoms: Secondary | ICD-10-CM | POA: Diagnosis not present

## 2023-01-20 DIAGNOSIS — Z79899 Other long term (current) drug therapy: Secondary | ICD-10-CM | POA: Diagnosis not present

## 2023-01-20 DIAGNOSIS — J45909 Unspecified asthma, uncomplicated: Secondary | ICD-10-CM | POA: Diagnosis not present

## 2023-01-20 DIAGNOSIS — Z809 Family history of malignant neoplasm, unspecified: Secondary | ICD-10-CM | POA: Diagnosis not present

## 2023-01-20 DIAGNOSIS — L309 Dermatitis, unspecified: Secondary | ICD-10-CM | POA: Diagnosis not present

## 2023-01-20 NOTE — Progress Notes (Signed)
Remote pacemaker transmission.   

## 2023-02-21 DIAGNOSIS — Z23 Encounter for immunization: Secondary | ICD-10-CM | POA: Diagnosis not present

## 2023-03-09 DIAGNOSIS — R351 Nocturia: Secondary | ICD-10-CM | POA: Diagnosis not present

## 2023-03-09 DIAGNOSIS — N401 Enlarged prostate with lower urinary tract symptoms: Secondary | ICD-10-CM | POA: Diagnosis not present

## 2023-03-09 DIAGNOSIS — R3129 Other microscopic hematuria: Secondary | ICD-10-CM | POA: Diagnosis not present

## 2023-03-13 NOTE — Progress Notes (Unsigned)
[facility]  [address] [place],  [place]  [address] [phone]  Clinic Day:  11/12/2022  Referring physician: Lise Auer, MD  HISTORY OF PRESENT ILLNESS:  The patient is an 82 y.o. 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  Due to his disease leading to significant anemia, he was placed on acalabrutinib 100 mg twice daily in late July 2022.  He comes in today to reassess his peripheral counts.  Since his last visit, the patient has been doing okay.  He claims his fatigue continues to improve.  He denies having any B symptoms or bulky lymphadenopathy which concerns him for catabolic disease progression while on acalabrutinib.    VITALS:  There were no vitals taken for this visit.  Wt Readings from Last 3 Encounters:  12/14/22 178 lb 1.3 oz (80.8 kg)  11/29/22 179 lb 0.6 oz (81.2 kg)  11/25/22 180 lb 1.9 oz (81.7 kg)    There is no height or weight on file to calculate BMI.  Performance status (ECOG): 2 - Symptomatic, <50% confined to bed  PHYSICAL EXAM:  Physical Exam Constitutional:      General: He is not in acute distress.    Appearance: Normal appearance. He is normal weight.  HENT:     Head: Normocephalic and atraumatic.  Eyes:     General: No scleral icterus.    Extraocular Movements: Extraocular movements intact.     Conjunctiva/sclera: Conjunctivae normal.     Pupils: Pupils are equal, round, and reactive to light.  Cardiovascular:     Rate and Rhythm: Regular rhythm. Bradycardia present.     Pulses: Normal pulses.     Heart sounds: Normal heart sounds. No murmur heard.    No friction rub. No gallop.  Pulmonary:     Effort: Pulmonary effort is normal. No respiratory distress.     Breath sounds: Normal breath sounds.  Abdominal:     General: Bowel sounds are normal. There is no distension.     Palpations: Abdomen is soft. There is no hepatomegaly,  splenomegaly or mass.     Tenderness: There is no abdominal tenderness.  Musculoskeletal:        General: Normal range of motion.     Cervical back: Normal range of motion and neck supple.     Right lower leg: No edema.     Left lower leg: No edema.  Lymphadenopathy:     Cervical: No cervical adenopathy.  Skin:    General: Skin is warm and dry.  Neurological:     General: No focal deficit present.     Mental Status: He is alert and oriented to person, place, and time. Mental status is at baseline.  Psychiatric:        Mood and Affect: Mood normal.        Behavior: Behavior normal.        Thought Content: Thought content normal.        Judgment: Judgment normal.   LABS:     Latest Reference Range & Units 11/12/22 15:46  Iron 45 - 182 ug/dL 64  UIBC ug/dL 865  TIBC 784 - 696 ug/dL 295  Saturation Ratios 17.9 - 39.5 % 19  Ferritin 24 - 336 ng/mL 14 (L)  Folate >5.9 ng/mL 6.5  Vitamin B12 180 - 914 pg/mL 235  (L): Data is abnormally low   Latest Reference Range & Units 11/12/22 14:37  Sodium 135 - 145 mmol/L 140  Potassium 3.5 - 5.1 mmol/L 4.1  Chloride 98 - 111 mmol/L 106  CO2 22 - 32 mmol/L 27  Glucose 70 - 99 mg/dL 086 (H)  BUN 8 - 23 mg/dL 26 (H)  Creatinine 5.78 - 1.24 mg/dL 4.69 (H)  Calcium 8.9 - 10.3 mg/dL 8.9  Anion gap 5 - 15  7  Alkaline Phosphatase 38 - 126 U/L 41  Albumin 3.5 - 5.0 g/dL 3.9  AST 15 - 41 U/L 14 (L)  ALT 0 - 44 U/L 14  Total Protein 6.5 - 8.1 g/dL 6.4 (L)  Total Bilirubin 0.3 - 1.2 mg/dL 0.4  GFR, Est Non African American >60 mL/min 52 (L)  (H): Data is abnormally high (L): Data is abnormally low  ASSESSMENT & PLAN:  Assessment/Plan:  An 82 year old gentleman with chronic lymphocytic leukemia.   Overall, his white count remains minimally elevated, but much better than what it has been previously.  There is nothing per his labs or physical exam which concerns me for progression of his CLL.  He knows to continue taking his acalabrutinib at  100 mg twice daily.  With respect to his anemia, it appears to be multifactorial.  In addition to having kidney disease, his ferritin is low, which suggests depleted iron stores.  Based upon this, I will arrange for him to receive IV over these next few week to improve his iron and hemoglobin levels.  Clinically, he is doing well.  I will see him back in 4 months for repeat clinical assessment.  The patient understands all the plans discussed today and is in agreement with them.    Joeanthony Seeling Kirby Funk, MD

## 2023-03-14 ENCOUNTER — Inpatient Hospital Stay: Payer: Medicare HMO

## 2023-03-14 ENCOUNTER — Other Ambulatory Visit: Payer: Self-pay | Admitting: Oncology

## 2023-03-14 ENCOUNTER — Inpatient Hospital Stay: Payer: Medicare HMO | Attending: Oncology | Admitting: Oncology

## 2023-03-14 VITALS — BP 130/60 | HR 62 | Temp 98.1°F | Resp 16 | Ht 67.0 in | Wt 173.8 lb

## 2023-03-14 DIAGNOSIS — D509 Iron deficiency anemia, unspecified: Secondary | ICD-10-CM | POA: Insufficient documentation

## 2023-03-14 DIAGNOSIS — C9110 Chronic lymphocytic leukemia of B-cell type not having achieved remission: Secondary | ICD-10-CM | POA: Insufficient documentation

## 2023-03-14 DIAGNOSIS — D508 Other iron deficiency anemias: Secondary | ICD-10-CM

## 2023-03-14 LAB — FERRITIN: Ferritin: 11 ng/mL — ABNORMAL LOW (ref 24–336)

## 2023-03-14 LAB — IRON AND TIBC
Iron: 43 ug/dL — ABNORMAL LOW (ref 45–182)
Saturation Ratios: 12 % — ABNORMAL LOW (ref 17.9–39.5)
TIBC: 374 ug/dL (ref 250–450)
UIBC: 331 ug/dL

## 2023-03-14 LAB — CBC AND DIFFERENTIAL
HCT: 28 — AB (ref 41–53)
Hemoglobin: 9.1 — AB (ref 13.5–17.5)
Neutrophils Absolute: 4.64
Platelets: 169 10*3/uL (ref 150–400)
WBC: 11.9

## 2023-03-14 LAB — CBC: RBC: 3.04 — AB (ref 3.87–5.11)

## 2023-03-14 LAB — FOLATE: Folate: 11 ng/mL (ref >5.9)

## 2023-03-15 ENCOUNTER — Encounter: Payer: Self-pay | Admitting: Oncology

## 2023-03-15 ENCOUNTER — Telehealth: Payer: Self-pay | Admitting: Oncology

## 2023-03-15 ENCOUNTER — Telehealth: Payer: Self-pay

## 2023-03-15 NOTE — Telephone Encounter (Signed)
Dr Melvyn Neth reviewed pt's iron studies. He wants pt to have another course of IV iron. Dr Melvyn Neth asked me to also tell him to NOT hold the acalabrutinib (as discussed in office).   Latest Reference Range & Units 03/14/23 13:59  Iron 45 - 182 ug/dL 43 (L)  UIBC ug/dL 841  TIBC 324 - 401 ug/dL 027  Saturation Ratios 17.9 - 39.5 % 12 (L)  Ferritin 24 - 336 ng/mL 11 (L)  Folate >5.9 ng/mL 11.0  (L): Data is abnormally low

## 2023-03-15 NOTE — Telephone Encounter (Signed)
Patient has been scheduled. Aware of appt date and time.   Scheduling Message Entered by Rennis Harding A on 03/14/2023 at  5:12 PM Priority: Routine <No visit type provided>  Department: [facility]  Provider:  Scheduling Notes:  Labs/appt 04-14-23

## 2023-03-16 ENCOUNTER — Encounter: Payer: Self-pay | Admitting: Oncology

## 2023-03-19 DIAGNOSIS — S61512A Laceration without foreign body of left wrist, initial encounter: Secondary | ICD-10-CM | POA: Diagnosis not present

## 2023-03-23 MED FILL — Iron Sucrose Inj 20 MG/ML (Fe Equiv): INTRAVENOUS | Qty: 10 | Status: AC

## 2023-03-24 ENCOUNTER — Inpatient Hospital Stay: Payer: Medicare HMO

## 2023-03-24 ENCOUNTER — Encounter: Payer: Self-pay | Admitting: Oncology

## 2023-03-24 VITALS — BP 135/60 | HR 56 | Temp 98.0°F | Resp 18

## 2023-03-24 DIAGNOSIS — D508 Other iron deficiency anemias: Secondary | ICD-10-CM

## 2023-03-24 DIAGNOSIS — D509 Iron deficiency anemia, unspecified: Secondary | ICD-10-CM | POA: Diagnosis not present

## 2023-03-24 DIAGNOSIS — C9110 Chronic lymphocytic leukemia of B-cell type not having achieved remission: Secondary | ICD-10-CM | POA: Diagnosis not present

## 2023-03-24 DIAGNOSIS — D631 Anemia in chronic kidney disease: Secondary | ICD-10-CM

## 2023-03-24 MED ORDER — SODIUM CHLORIDE 0.9 % IV SOLN
200.0000 mg | Freq: Once | INTRAVENOUS | Status: AC
  Administered 2023-03-24: 200 mg via INTRAVENOUS
  Filled 2023-03-24: qty 200

## 2023-03-24 MED ORDER — SODIUM CHLORIDE 0.9 % IV SOLN: Freq: Once | INTRAVENOUS | Status: AC

## 2023-03-24 MED FILL — Iron Sucrose Inj 20 MG/ML (Fe Equiv): INTRAVENOUS | Qty: 10 | Status: AC

## 2023-03-24 NOTE — Patient Instructions (Signed)

## 2023-03-25 ENCOUNTER — Inpatient Hospital Stay: Payer: Medicare HMO

## 2023-03-25 VITALS — BP 125/66 | HR 61 | Temp 97.9°F | Resp 18

## 2023-03-25 DIAGNOSIS — D631 Anemia in chronic kidney disease: Secondary | ICD-10-CM

## 2023-03-25 DIAGNOSIS — D508 Other iron deficiency anemias: Secondary | ICD-10-CM

## 2023-03-25 DIAGNOSIS — D509 Iron deficiency anemia, unspecified: Secondary | ICD-10-CM | POA: Diagnosis not present

## 2023-03-25 DIAGNOSIS — C9110 Chronic lymphocytic leukemia of B-cell type not having achieved remission: Secondary | ICD-10-CM | POA: Diagnosis not present

## 2023-03-25 MED ORDER — SODIUM CHLORIDE 0.9 % IV SOLN
200.0000 mg | Freq: Once | INTRAVENOUS | Status: AC
  Administered 2023-03-25: 200 mg via INTRAVENOUS
  Filled 2023-03-25: qty 200

## 2023-03-25 MED ORDER — SODIUM CHLORIDE 0.9 % IV SOLN: Freq: Once | INTRAVENOUS | Status: AC

## 2023-03-25 NOTE — Patient Instructions (Signed)

## 2023-03-27 ENCOUNTER — Encounter: Payer: Self-pay | Admitting: Oncology

## 2023-03-28 ENCOUNTER — Inpatient Hospital Stay: Payer: Medicare HMO

## 2023-03-28 ENCOUNTER — Encounter: Payer: Self-pay | Admitting: Oncology

## 2023-03-28 VITALS — BP 130/63 | HR 58 | Temp 97.5°F | Resp 18 | Ht 67.0 in | Wt 170.0 lb

## 2023-03-28 DIAGNOSIS — D631 Anemia in chronic kidney disease: Secondary | ICD-10-CM

## 2023-03-28 DIAGNOSIS — C9110 Chronic lymphocytic leukemia of B-cell type not having achieved remission: Secondary | ICD-10-CM | POA: Diagnosis not present

## 2023-03-28 DIAGNOSIS — D508 Other iron deficiency anemias: Secondary | ICD-10-CM

## 2023-03-28 DIAGNOSIS — D509 Iron deficiency anemia, unspecified: Secondary | ICD-10-CM | POA: Diagnosis not present

## 2023-03-28 MED ORDER — IRON SUCROSE 20 MG/ML IV SOLN
200.0000 mg | Freq: Once | INTRAVENOUS | Status: AC
  Administered 2023-03-28: 200 mg via INTRAVENOUS
  Filled 2023-03-28: qty 200

## 2023-03-28 MED ORDER — SODIUM CHLORIDE 0.9 % IV SOLN: Freq: Once | INTRAVENOUS | Status: AC

## 2023-03-28 NOTE — Patient Instructions (Signed)

## 2023-03-29 ENCOUNTER — Encounter: Payer: Self-pay | Admitting: Oncology

## 2023-03-29 ENCOUNTER — Inpatient Hospital Stay: Payer: Medicare HMO

## 2023-03-29 VITALS — BP 155/74 | HR 59 | Temp 98.1°F | Resp 18

## 2023-03-29 DIAGNOSIS — D508 Other iron deficiency anemias: Secondary | ICD-10-CM

## 2023-03-29 DIAGNOSIS — C9110 Chronic lymphocytic leukemia of B-cell type not having achieved remission: Secondary | ICD-10-CM | POA: Diagnosis not present

## 2023-03-29 DIAGNOSIS — D631 Anemia in chronic kidney disease: Secondary | ICD-10-CM

## 2023-03-29 DIAGNOSIS — D509 Iron deficiency anemia, unspecified: Secondary | ICD-10-CM | POA: Diagnosis not present

## 2023-03-29 MED ORDER — SODIUM CHLORIDE 0.9 % IV SOLN: Freq: Once | INTRAVENOUS | Status: AC

## 2023-03-29 MED ORDER — IRON SUCROSE 20 MG/ML IV SOLN
200.0000 mg | Freq: Once | INTRAVENOUS | Status: AC
Start: 2023-03-29 — End: 2023-03-29
  Administered 2023-03-29: 200 mg via INTRAVENOUS

## 2023-03-29 NOTE — Patient Instructions (Signed)
Iron Sucrose Injection What is this medication? IRON SUCROSE (EYE ern SOO krose) treats low levels of iron (iron deficiency anemia) in people with kidney disease. Iron is a mineral that plays an important role in making red blood cells, which carry oxygen from your lungs to the rest of your body. This medicine may be used for other purposes; ask your health care provider or pharmacist if you have questions. COMMON BRAND NAME(S): Venofer What should I tell my care team before I take this medication? They need to know if you have any of these conditions: Anemia not caused by low iron levels Heart disease High levels of iron in the blood Kidney disease Liver disease An unusual or allergic reaction to iron, other medications, foods, dyes, or preservatives Pregnant or trying to get pregnant Breastfeeding How should I use this medication? This medication is for infusion into a vein. It is given in a hospital or clinic setting. Talk to your care team about the use of this medication in children. While this medication may be prescribed for children as young as 2 years for selected conditions, precautions do apply. Overdosage: If you think you have taken too much of this medicine contact a poison control center or emergency room at once. NOTE: This medicine is only for you. Do not share this medicine with others. What if I miss a dose? Keep appointments for follow-up doses. It is important not to miss your dose. Call your care team if you are unable to keep an appointment. What may interact with this medication? Do not take this medication with any of the following: Deferoxamine Dimercaprol Other iron products This medication may also interact with the following: Chloramphenicol Deferasirox This list may not describe all possible interactions. Give your health care provider a list of all the medicines, herbs, non-prescription drugs, or dietary supplements you use. Also tell them if you smoke,  drink alcohol, or use illegal drugs. Some items may interact with your medicine. What should I watch for while using this medication? Visit your care team regularly. Tell your care team if your symptoms do not start to get better or if they get worse. You may need blood work done while you are taking this medication. You may need to follow a special diet. Talk to your care team. Foods that contain iron include: whole grains/cereals, dried fruits, beans, or peas, leafy green vegetables, and organ meats (liver, kidney). What side effects may I notice from receiving this medication? Side effects that you should report to your care team as soon as possible: Allergic reactions--skin rash, itching, hives, swelling of the face, lips, tongue, or throat Low blood pressure--dizziness, feeling faint or lightheaded, blurry vision Shortness of breath Side effects that usually do not require medical attention (report to your care team if they continue or are bothersome): Flushing Headache Joint pain Muscle pain Nausea Pain, redness, or irritation at injection site This list may not describe all possible side effects. Call your doctor for medical advice about side effects. You may report side effects to FDA at 1-800-FDA-1088. Where should I keep my medication? This medication is given in a hospital or clinic. It will not be stored at home. NOTE: This sheet is a summary. It may not cover all possible information. If you have questions about this medicine, talk to your doctor, pharmacist, or health care provider.  2024 Elsevier/Gold Standard (2022-10-29 00:00:00)

## 2023-03-31 ENCOUNTER — Inpatient Hospital Stay: Payer: Medicare HMO

## 2023-03-31 VITALS — BP 146/66 | HR 71 | Temp 98.0°F | Resp 16

## 2023-03-31 DIAGNOSIS — D631 Anemia in chronic kidney disease: Secondary | ICD-10-CM

## 2023-03-31 DIAGNOSIS — C9110 Chronic lymphocytic leukemia of B-cell type not having achieved remission: Secondary | ICD-10-CM | POA: Diagnosis not present

## 2023-03-31 DIAGNOSIS — D508 Other iron deficiency anemias: Secondary | ICD-10-CM

## 2023-03-31 DIAGNOSIS — D509 Iron deficiency anemia, unspecified: Secondary | ICD-10-CM | POA: Diagnosis not present

## 2023-03-31 MED ORDER — SODIUM CHLORIDE 0.9 % IV SOLN: Freq: Once | INTRAVENOUS | Status: AC

## 2023-03-31 MED ORDER — IRON SUCROSE 20 MG/ML IV SOLN
200.0000 mg | Freq: Once | INTRAVENOUS | Status: AC
Start: 2023-03-31 — End: 2023-03-31
  Administered 2023-03-31: 200 mg via INTRAVENOUS

## 2023-03-31 NOTE — Patient Instructions (Signed)

## 2023-04-13 NOTE — Progress Notes (Signed)
[facility]  [address] [place],  [place]  [address] [phone]  Clinic Day:  04/14/2023  Referring physician: Lise Auer, MD  HISTORY OF PRESENT ILLNESS:  The patient is an 82 y.o. 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  Due to his disease leading to significant anemia, he was placed on acalabrutinib 100 mg twice daily in late July 2022, which has led to disease improvement.  He also was recently found to be iron deficient again to where he received IV iron in October 2024.   He comes in today to reassess his peripheral counts.  Since his last visit, the patient has felt weak.  He denies having any overt forms of blood loss.  He denies having any B symptoms or bulky lymphadenopathy which concerns him for catabolic disease progression of his CLL while on acalabrutinib.    VITALS:  Blood pressure 124/64, pulse 85, temperature 98.1 F (36.7 C), resp. rate 18, height 5\' 7"  (1.702 m), weight 175 lb 14.4 oz (79.8 kg), SpO2 97%.  Wt Readings from Last 3 Encounters:  04/14/23 175 lb 14.4 oz (79.8 kg)  03/28/23 170 lb (77.1 kg)  03/14/23 173 lb 12.8 oz (78.8 kg)    Body mass index is 27.55 kg/m.  Performance status (ECOG): 2 - Symptomatic, <50% confined to bed  PHYSICAL EXAM:  Physical Exam Constitutional:      General: He is not in acute distress.    Appearance: Normal appearance. He is normal weight.  HENT:     Head: Normocephalic and atraumatic.  Eyes:     General: No scleral icterus.    Extraocular Movements: Extraocular movements intact.     Conjunctiva/sclera: Conjunctivae normal.     Pupils: Pupils are equal, round, and reactive to light.  Cardiovascular:     Rate and Rhythm: Normal rate and regular rhythm.     Pulses: Normal pulses.     Heart sounds: Normal heart sounds. No murmur heard.    No friction rub. No gallop.  Pulmonary:     Effort: Pulmonary effort is  normal. No respiratory distress.     Breath sounds: Normal breath sounds.  Abdominal:     General: Bowel sounds are normal. There is no distension.     Palpations: Abdomen is soft. There is no hepatomegaly, splenomegaly or mass.     Tenderness: There is no abdominal tenderness.  Musculoskeletal:        General: Normal range of motion.     Cervical back: Normal range of motion and neck supple.     Right lower leg: No edema.     Left lower leg: No edema.  Lymphadenopathy:     Cervical: No cervical adenopathy.  Skin:    General: Skin is warm and dry.  Neurological:     General: No focal deficit present.     Mental Status: He is alert and oriented to person, place, and time. Mental status is at baseline.  Psychiatric:        Mood and Affect: Mood normal.        Behavior: Behavior normal.        Thought Content: Thought content normal.        Judgment: Judgment normal.    LABS:    Latest Reference Range & Units 04/14/23 14:06  WBC 4.0 - 10.5 K/uL 9.2  RBC 4.22 - 5.81 MIL/uL 2.68 (L)  Hemoglobin 13.0 -  17.0 g/dL 8.5 (L)  HCT 65.7 - 84.6 % 26.8 (L)  MCV 80.0 - 100.0 fL 100.0  MCH 26.0 - 34.0 pg 31.7  MCHC 30.0 - 36.0 g/dL 96.2  RDW 95.2 - 84.1 % 17.6 (H)  Platelets 150 - 400 K/uL 151  nRBC 0.0 - 0.2 % 0 /100 WBC 0.00 0  Neutrophils % 40  Lymphocytes % 52  Monocytes Relative % 6  Eosinophil % 1  Basophil % 1  Immature Granulocytes % 0  (L): Data is abnormally low (H): Data is abnormally high    Latest Reference Range & Units 03/14/23 13:59 04/14/23 14:06  Iron 45 - 182 ug/dL 43 (L) 56  UIBC ug/dL 324 401  TIBC 027 - 253 ug/dL 664 403  Saturation Ratios 17.9 - 39.5 % 12 (L) 17 (L)  Ferritin 24 - 336 ng/mL 11 (L) 83  (L): Data is abnormally low  ASSESSMENT & PLAN:  Assessment/Plan:  An 82 year old gentleman with chronic lymphocytic leukemia.   Despite his iron parameters clearly improving since receiving IV iron, his hemoglobin remains suboptimal.  This suggests his  acalabrutinib is likely leading to some degree of bone marrow suppression.  Based upon this, I will have him hold his acalabrutinib for 1 month.  I will see him back in 1 month to see if his counts have improved with the holding of his acalabrutinib.  The patient understands all the plans discussed today and is in agreement with them.    Kash Davie Kirby Funk, MD

## 2023-04-14 ENCOUNTER — Inpatient Hospital Stay: Payer: Medicare HMO

## 2023-04-14 ENCOUNTER — Telehealth: Payer: Self-pay | Admitting: Oncology

## 2023-04-14 ENCOUNTER — Other Ambulatory Visit: Payer: Self-pay | Admitting: Oncology

## 2023-04-14 ENCOUNTER — Inpatient Hospital Stay: Payer: Medicare HMO | Attending: Oncology | Admitting: Oncology

## 2023-04-14 VITALS — BP 124/64 | HR 85 | Temp 98.1°F | Resp 18 | Ht 67.0 in | Wt 175.9 lb

## 2023-04-14 DIAGNOSIS — C9110 Chronic lymphocytic leukemia of B-cell type not having achieved remission: Secondary | ICD-10-CM | POA: Diagnosis not present

## 2023-04-14 DIAGNOSIS — D508 Other iron deficiency anemias: Secondary | ICD-10-CM

## 2023-04-14 LAB — CBC WITH DIFFERENTIAL (CANCER CENTER ONLY)
Abs Immature Granulocytes: 0.02 10*3/uL (ref 0.00–0.07)
Basophils Absolute: 0.1 10*3/uL (ref 0.0–0.1)
Basophils Relative: 1 %
Eosinophils Absolute: 0.1 10*3/uL (ref 0.0–0.5)
Eosinophils Relative: 1 %
HCT: 26.8 % — ABNORMAL LOW (ref 39.0–52.0)
Hemoglobin: 8.5 g/dL — ABNORMAL LOW (ref 13.0–17.0)
Immature Granulocytes: 0 %
Lymphocytes Relative: 52 %
Lymphs Abs: 4.9 10*3/uL — ABNORMAL HIGH (ref 0.7–4.0)
MCH: 31.7 pg (ref 26.0–34.0)
MCHC: 31.7 g/dL (ref 30.0–36.0)
MCV: 100 fL (ref 80.0–100.0)
Monocytes Absolute: 0.5 10*3/uL (ref 0.1–1.0)
Monocytes Relative: 6 %
Neutro Abs: 3.6 10*3/uL (ref 1.7–7.7)
Neutrophils Relative %: 40 %
Platelet Count: 151 10*3/uL (ref 150–400)
RBC: 2.68 MIL/uL — ABNORMAL LOW (ref 4.22–5.81)
RDW: 17.6 % — ABNORMAL HIGH (ref 11.5–15.5)
WBC Count: 9.2 10*3/uL (ref 4.0–10.5)
nRBC: 0 % (ref 0.0–0.2)
nRBC: 0 /100{WBCs}

## 2023-04-14 LAB — IRON AND TIBC
Iron: 56 ug/dL (ref 45–182)
Saturation Ratios: 17 % — ABNORMAL LOW (ref 17.9–39.5)
TIBC: 330 ug/dL (ref 250–450)
UIBC: 274 ug/dL

## 2023-04-14 LAB — FERRITIN: Ferritin: 83 ng/mL (ref 24–336)

## 2023-04-14 NOTE — Telephone Encounter (Signed)
Patient has been scheduled for follow-up visit per 04/14/23 LOS.  Pt given an appt calendar with date and time.

## 2023-04-15 ENCOUNTER — Telehealth: Payer: Self-pay

## 2023-04-15 NOTE — Telephone Encounter (Signed)
Dr Melvyn Neth reviewed labs, & states not consistent with iron deficiency. He wants them to hold acalabrutinib for 1 month. Pt's wife notified and verbalized understanding.    Latest Reference Range & Units 04/14/23 14:06  Iron 45 - 182 ug/dL 56  UIBC ug/dL 045  TIBC 409 - 811 ug/dL 914  Saturation Ratios 17.9 - 39.5 % 17 (L)  Ferritin 24 - 336 ng/mL 83  (L): Data is abnormally low

## 2023-04-16 LAB — SOLUBLE TRANSFERRIN RECEPTOR: Transferrin Receptor: 27.9 nmol/L — ABNORMAL HIGH (ref 12.2–27.3)

## 2023-04-19 ENCOUNTER — Emergency Department (HOSPITAL_COMMUNITY)
Admission: EM | Admit: 2023-04-19 | Discharge: 2023-04-19 | Disposition: A | Discharge status: Home / Self Care | Payer: Medicare HMO | Attending: Emergency Medicine | Admitting: Emergency Medicine

## 2023-04-19 ENCOUNTER — Other Ambulatory Visit: Payer: Self-pay

## 2023-04-19 ENCOUNTER — Emergency Department (HOSPITAL_COMMUNITY): Payer: Medicare HMO

## 2023-04-19 ENCOUNTER — Encounter (HOSPITAL_COMMUNITY): Payer: Self-pay

## 2023-04-19 DIAGNOSIS — N183 Chronic kidney disease, stage 3 unspecified: Secondary | ICD-10-CM | POA: Diagnosis not present

## 2023-04-19 DIAGNOSIS — M79672 Pain in left foot: Secondary | ICD-10-CM | POA: Diagnosis not present

## 2023-04-19 DIAGNOSIS — Z79899 Other long term (current) drug therapy: Secondary | ICD-10-CM | POA: Diagnosis not present

## 2023-04-19 DIAGNOSIS — M5442 Lumbago with sciatica, left side: Secondary | ICD-10-CM | POA: Diagnosis not present

## 2023-04-19 DIAGNOSIS — I4891 Unspecified atrial fibrillation: Secondary | ICD-10-CM | POA: Diagnosis not present

## 2023-04-19 DIAGNOSIS — M47816 Spondylosis without myelopathy or radiculopathy, lumbar region: Secondary | ICD-10-CM | POA: Diagnosis not present

## 2023-04-19 DIAGNOSIS — M545 Low back pain, unspecified: Secondary | ICD-10-CM | POA: Diagnosis not present

## 2023-04-19 LAB — CBC WITH DIFFERENTIAL/PLATELET
Abs Immature Granulocytes: 0.02 10*3/uL (ref 0.00–0.07)
Basophils Absolute: 0 10*3/uL (ref 0.0–0.1)
Basophils Relative: 1 %
Eosinophils Absolute: 0.1 10*3/uL (ref 0.0–0.5)
Eosinophils Relative: 1 %
HCT: 29.3 % — ABNORMAL LOW (ref 39.0–52.0)
Hemoglobin: 9 g/dL — ABNORMAL LOW (ref 13.0–17.0)
Immature Granulocytes: 0 %
Lymphocytes Relative: 23 %
Lymphs Abs: 1 10*3/uL (ref 0.7–4.0)
MCH: 31.5 pg (ref 26.0–34.0)
MCHC: 30.7 g/dL (ref 30.0–36.0)
MCV: 102.4 fL — ABNORMAL HIGH (ref 80.0–100.0)
Monocytes Absolute: 0.5 10*3/uL (ref 0.1–1.0)
Monocytes Relative: 10 %
Neutro Abs: 2.9 10*3/uL (ref 1.7–7.7)
Neutrophils Relative %: 65 %
Platelets: 138 10*3/uL — ABNORMAL LOW (ref 150–400)
RBC: 2.86 MIL/uL — ABNORMAL LOW (ref 4.22–5.81)
RDW: 16.4 % — ABNORMAL HIGH (ref 11.5–15.5)
WBC: 4.5 10*3/uL (ref 4.0–10.5)
nRBC: 0 % (ref 0.0–0.2)

## 2023-04-19 LAB — BASIC METABOLIC PANEL
Anion gap: 8 (ref 5–15)
BUN: 21 mg/dL (ref 8–23)
CO2: 27 mmol/L (ref 22–32)
Calcium: 9.1 mg/dL (ref 8.9–10.3)
Chloride: 105 mmol/L (ref 98–111)
Creatinine, Ser: 1.65 mg/dL — ABNORMAL HIGH (ref 0.61–1.24)
GFR, Estimated: 41 mL/min — ABNORMAL LOW (ref >60)
Glucose, Bld: 152 mg/dL — ABNORMAL HIGH (ref 70–99)
Potassium: 4.4 mmol/L (ref 3.5–5.1)
Sodium: 140 mmol/L (ref 135–145)

## 2023-04-19 LAB — URINALYSIS, ROUTINE W REFLEX MICROSCOPIC
Bacteria, UA: NONE SEEN
Bilirubin Urine: NEGATIVE
Glucose, UA: NEGATIVE mg/dL
Ketones, ur: NEGATIVE mg/dL
Leukocytes,Ua: NEGATIVE
Nitrite: NEGATIVE
Protein, ur: NEGATIVE mg/dL
Specific Gravity, Urine: 1.01 (ref 1.005–1.030)
pH: 5 (ref 5.0–8.0)

## 2023-04-19 MED ORDER — LIDOCAINE 5 % EX PTCH
1.0000 | MEDICATED_PATCH | CUTANEOUS | Status: DC
  Administered 2023-04-19: 1 via TRANSDERMAL
  Filled 2023-04-19: qty 1

## 2023-04-19 MED ORDER — LIDOCAINE 5 % EX PTCH: 1.0000 | MEDICATED_PATCH | CUTANEOUS | 0 refills | Status: AC

## 2023-04-19 MED ORDER — ACETAMINOPHEN 325 MG PO TABS
650.0000 mg | ORAL_TABLET | Freq: Once | ORAL | Status: AC
  Administered 2023-04-19: 650 mg via ORAL
  Filled 2023-04-19: qty 2

## 2023-04-19 NOTE — ED Provider Triage Note (Signed)
Emergency Medicine Provider Triage Evaluation Note  Man Mom , a 82 y.o. male  was evaluated in triage.  Pt complains of back pain.  Review of Systems  Positive: Low back pain radiates to left foot Negative: Bowel/bladder dysfunction, abd pain, chest pain  Physical Exam  BP 118/85 (BP Location: Left Arm)   Pulse 76   Temp 98.5 F (36.9 C) (Oral)   Resp 16   Wt 79 kg   SpO2 97%   BMI 27.28 kg/m  Gen:   Awake, no distress   Resp:  Normal effort  MSK:   Moves extremities without difficulty  Other:    Medical Decision Making  Medically screening exam initiated at 2:38 PM.  Appropriate orders placed.  Foster Simpson was informed that the remainder of the evaluation will be completed by another provider, this initial triage assessment does not replace that evaluation, and the importance of remaining in the ED until their evaluation is complete.  History of CLL, recent anemia requiring iron infusions Remote h/o lumbar surgery, no pain until this week.    Elpidio Anis, PA-C 04/19/23 1441

## 2023-04-19 NOTE — Discharge Instructions (Addendum)
As discussed, your imaging shows no signs of fractures. Multilevel degenerative changes, which are unchanged from CT in 2020.  Your pain is most likely radiculopathy from the degenerative changes.  I have sent a prescription of lidocaine patches to your pharmacy. If they are not covered, you can pick them up OTC.  Additionally, you can take Tylenol every 6-8 hours as needed for pain.  Follow-up with your PCP in 1 week for reevaluation.  Return to the ED if her symptoms worsen in the interim.

## 2023-04-19 NOTE — ED Triage Notes (Signed)
C/o lower back pain radiating into left leg x2 days.   Pain increased when trying to put pressure on foot and when trying to stand from sitting position.

## 2023-04-19 NOTE — ED Provider Notes (Signed)
[place] EMERGENCY DEPARTMENT AT [facility] Provider Note   CSN: [id number] Arrival date & time: 04/19/23  1411     History  Chief Complaint  Patient presents with   Back Pain    Michael Aguirre is a 82 y.o. male with a history of CLL, stage 3 CKD, TIA, and atrial fibrillation presents to the ED today for back pain.  Patient reports low back pain radiating to his left hip and down the left leg for the past 2 days.  He states that he is pain in his left heel when he tries to stand.  Patient's wife is at bedside and reports that he has a history of herniated disc in the past and the pain he is describing sounds similar to when he herniated a disc previously.  Patient denies any recent injury or trauma to the back.  Denies any new or worsening weakness, loss of sensation, or inability to ambulate.  He has been taking Tylenol for his pain with relief.  No additional complaints or concerns at this time.    Home Medications Prior to Admission medications   Medication Sig Start Date End Date Taking? Authorizing Provider  lidocaine (LIDODERM) 5 % Place 1 patch onto the skin daily. Remove & Discard patch within 12 hours or as directed by MD 04/19/23 05/19/23 Yes Maxwell Marion, PA-C  albuterol (PROVENTIL) (2.5 MG/3ML) 0.083% nebulizer solution SMARTSIG:3 Milliliter(s) Via Nebulizer Every 8 Hours PRN 11/22/22   [provider]  ALPRAZolam Prudy Feeler) 0.5 MG tablet TAKE 1 TABLET BY MOUTH TWICE  DAILY AS NEEDED FOR ANXIETY 01/28/22   Ilda Basset A, NP  amLODipine (NORVASC) 10 MG tablet Take 1 tablet (10 mg total) by mouth daily. 09/10/19   Camnitz, Andree Coss, MD  amoxicillin (AMOXIL) 500 MG capsule Take 1,000 mg by mouth 2 (two) times daily. 11/22/22   [provider]  CALQUENCE 100 MG tablet Take by mouth. 04/19/22   [provider]  chlorpheniramine-HYDROcodone (TUSSIONEX) 10-8 MG/5ML SUER Take 5 mLs by mouth every 12 (twelve) hours as needed for cough.  01/03/21   Adah Perl, PA-C  [facility] ANTACID/ANTI-GAS [phone] MG/5ML suspension Take by mouth. 01/03/21   [provider]  finasteride (PROSCAR) 5 MG tablet Take 5 mg by mouth daily. 11/28/17   [provider]  furosemide (LASIX) 20 MG tablet Take 20 mg by mouth as needed.     [provider]  isosorbide mononitrate (IMDUR) 30 MG 24 hr tablet Take 1 tablet (30 mg total) by mouth daily. 05/26/22   Georgeanna Lea, MD  ketoconazole (NIZORAL) 2 % shampoo SMARTSIG:Topical 2-3 Times Weekly 12/28/22   [provider]  levothyroxine (SYNTHROID) 50 MCG tablet Take 50 mcg by mouth daily before breakfast.    [provider]  lisinopril (ZESTRIL) 5 MG tablet Take 5 mg by mouth daily. 10/22/22   [provider]  magic mouthwash (lidocaine, diphenhydrAMINE, alum & mag hydroxide) suspension Swish and spit 15 mLs every 3 (three) hours as needed for mouth pain. 11/24/22   Mosher, Harvin Hazel A, PA-C  montelukast (SINGULAIR) 10 MG tablet Take 10 mg by mouth daily. 12/14/22   [provider]  mupirocin ointment (BACTROBAN) 2 % Apply 1 application topically 2 (two) times daily. 09/11/20   [provider]  nitroGLYCERIN (NITROSTAT) 0.4 MG SL tablet Place 1 tablet (0.4 mg total) under the tongue every 5 (five) minutes as needed for chest pain. 05/19/22   Georgeanna Lea, MD  ondansetron (  ZOFRAN) 8 MG tablet Take 8 mg by mouth as needed for nausea or vomiting.    [provider]  tamsulosin (FLOMAX) 0.4 MG CAPS capsule Take 0.8 mg by mouth daily. 09/24/20   [provider]  triamcinolone ointment (KENALOG) 0.5 % Apply topically. 12/28/22   [provider]      Allergies    Tetracyclines & related, Isosorbide, Tetracycline, and Other    Review of Systems   Review of Systems  Musculoskeletal:  Positive for back pain.  All other systems reviewed and are negative.   Physical Exam Updated Vital Signs BP (!) 149/94   Pulse  60   Temp 97.7 F (36.5 C)   Resp 16   Wt 79 kg   SpO2 100%   BMI 27.28 kg/m  Physical Exam Vitals and nursing note reviewed.  Constitutional:      General: He is not in acute distress.    Appearance: Normal appearance.  HENT:     Head: Normocephalic and atraumatic.     Mouth/Throat:     Mouth: Mucous membranes are moist.  Eyes:     Conjunctiva/sclera: Conjunctivae normal.     Pupils: Pupils are equal, round, and reactive to light.  Neck:     Comments: No midline cervical tenderness Cardiovascular:     Rate and Rhythm: Normal rate and regular rhythm.     Pulses: Normal pulses.     Heart sounds: Normal heart sounds.  Pulmonary:     Effort: Pulmonary effort is normal.     Breath sounds: Normal breath sounds.  Abdominal:     Palpations: Abdomen is soft.     Tenderness: There is no abdominal tenderness.  Musculoskeletal:        General: Tenderness present. Normal range of motion.     Cervical back: Normal range of motion. No tenderness.  Skin:    General: Skin is warm and dry.     Findings: No rash.  Neurological:     General: No focal deficit present.     Mental Status: He is alert. Mental status is at baseline.     Sensory: No sensory deficit.     Motor: No weakness.  Psychiatric:        Mood and Affect: Mood normal.        Behavior: Behavior normal.    ED Results / Procedures / Treatments   Labs (all labs ordered are listed, but only abnormal results are displayed) Labs Reviewed  CBC WITH DIFFERENTIAL/PLATELET - Abnormal; Notable for the following components:      Result Value   RBC 2.86 (*)    Hemoglobin 9.0 (*)    HCT 29.3 (*)    MCV 102.4 (*)    RDW 16.4 (*)    Platelets 138 (*)    All other components within normal limits  BASIC METABOLIC PANEL - Abnormal; Notable for the following components:   Glucose, Bld 152 (*)    Creatinine, Ser 1.65 (*)    GFR, Estimated 41 (*)    All other components within normal limits  URINALYSIS, ROUTINE W REFLEX  MICROSCOPIC - Abnormal; Notable for the following components:   APPearance HAZY (*)    Hgb urine dipstick SMALL (*)    All other components within normal limits    EKG None  Radiology DG Lumbar Spine Complete  Result Date: 04/19/2023 CLINICAL DATA:  Low back pain, radiating into left leg, recent fall EXAM: LUMBAR SPINE - COMPLETE 4+ VIEW COMPARISON:  04/02/2016  lumbar spine radiographs, correlation is also made with 05/29/2019 lumbar spine CT FINDINGS: There is no evidence of lumbar spine fracture. No significant listhesis. Multilevel disc height loss, which appears overall unchanged compared to the 2020 CT. Multilevel facet arthropathy, greater in the lower lumbar spine. IMPRESSION: 1. No evidence of lumbar spine fracture. 2. Multilevel degenerative changes, which appear overall unchanged compared to the 2020 CT. Electronically Signed   By: Wiliam Ke M.D.   On: 04/19/2023 17:47    Procedures Procedures: not indicated.   Medications Ordered in ED Medications  lidocaine (LIDODERM) 5 % 1 patch (1 patch Transdermal Patch Applied 04/19/23 1821)  lidocaine (LIDODERM) 5 % 1 patch (1 patch Transdermal Patch Applied 04/19/23 1848)  acetaminophen (TYLENOL) tablet 650 mg (650 mg Oral Given 04/19/23 1845)    ED Course/ Medical Decision Making/ A&P                                 Medical Decision Making Risk OTC drugs. Prescription drug management.   This patient presents to the ED for concern of low back pain, this involves an extensive number of treatment options, and is a complaint that carries with it a high risk of complications and morbidity.   Differential diagnosis includes: Fracture, dislocation, stenosis, radiculopathy, contusion, etc.   Comorbidities  See HPI above   Additional History  Additional history obtained from prior records.   Lab Tests  I ordered and personally interpreted labs.  The pertinent results include:   BMP and CBC are within normal limits  for patient - no acute AKI, electrolyte derangement, infection, or anemia UA is within normal limits - no acute infection   Imaging Studies  I ordered imaging studies including lumbar spine x-ray  I independently visualized and interpreted imaging which showed: No evidence of lumbar spine fracture.  Mild to level degenerative changes, which appear unchanged compared to 2020 CT. I agree with the radiologist interpretation   Problem List / ED Course / Critical Interventions / Medication Management  Back pain I ordered medications including: Tylenol and Lidocaine patch for pain  Reevaluation of the patient after these medicines showed that the patient improved I have reviewed the patients home medicines and have made adjustments as needed   Social Determinants of Health  Physical activity   Test / Admission - Considered  Discussed findings with patient and wife at bedside.  All questions were answered. He is hemodynamically stable and safe for discharge home. Return precautions provided.       Final Clinical Impression(s) / ED Diagnoses Final diagnoses:  Acute left-sided low back pain with left-sided sciatica  Pain of left heel    Rx / DC Orders ED Discharge Orders          Ordered    lidocaine (LIDODERM) 5 %  Every 24 hours        04/19/23 1845              Maxwell Marion, PA-C 04/19/23 2252    Wynetta Fines, MD 04/19/23 2259

## 2023-04-20 DIAGNOSIS — M5440 Lumbago with sciatica, unspecified side: Secondary | ICD-10-CM | POA: Diagnosis not present

## 2023-04-20 DIAGNOSIS — Z6826 Body mass index (BMI) 26.0-26.9, adult: Secondary | ICD-10-CM | POA: Diagnosis not present

## 2023-04-20 DIAGNOSIS — C9110 Chronic lymphocytic leukemia of B-cell type not having achieved remission: Secondary | ICD-10-CM | POA: Diagnosis not present

## 2023-04-20 DIAGNOSIS — N183 Chronic kidney disease, stage 3 unspecified: Secondary | ICD-10-CM | POA: Diagnosis not present

## 2023-05-03 DIAGNOSIS — J40 Bronchitis, not specified as acute or chronic: Secondary | ICD-10-CM | POA: Diagnosis not present

## 2023-05-03 DIAGNOSIS — J329 Chronic sinusitis, unspecified: Secondary | ICD-10-CM | POA: Diagnosis not present

## 2023-05-03 DIAGNOSIS — Z6826 Body mass index (BMI) 26.0-26.9, adult: Secondary | ICD-10-CM | POA: Diagnosis not present

## 2023-05-04 ENCOUNTER — Other Ambulatory Visit: Payer: Self-pay | Admitting: Cardiology

## 2023-05-09 DIAGNOSIS — M4726 Other spondylosis with radiculopathy, lumbar region: Secondary | ICD-10-CM | POA: Diagnosis not present

## 2023-05-10 ENCOUNTER — Other Ambulatory Visit: Payer: Self-pay | Admitting: Student in an Organized Health Care Education/Training Program

## 2023-05-10 DIAGNOSIS — M4726 Other spondylosis with radiculopathy, lumbar region: Secondary | ICD-10-CM

## 2023-05-10 DIAGNOSIS — M5416 Radiculopathy, lumbar region: Secondary | ICD-10-CM

## 2023-05-11 NOTE — Progress Notes (Deleted)
[facility]  [address] [place],  [place]  [address] [phone]  Clinic Day:  04/14/2023  Referring physician: Lise Auer, MD  HISTORY OF PRESENT ILLNESS:  The patient is an 82 y.o. 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  Due to his disease leading to significant anemia, he was placed on acalabrutinib 100 mg twice daily in late July 2022, which has led to disease improvement.  He also was recently found to be iron deficient again to where he received IV iron in October 2024.   He comes in today to reassess his peripheral counts.  Since his last visit, the patient has felt weak.  He denies having any overt forms of blood loss.  He denies having any B symptoms or bulky lymphadenopathy which concerns him for catabolic disease progression of his CLL while on acalabrutinib.    VITALS:  There were no vitals taken for this visit.  Wt Readings from Last 3 Encounters:  04/19/23 174 lb 2.6 oz (79 kg)  04/14/23 175 lb 14.4 oz (79.8 kg)  03/28/23 170 lb (77.1 kg)    There is no height or weight on file to calculate BMI.  Performance status (ECOG): 2 - Symptomatic, <50% confined to bed  PHYSICAL EXAM:  Physical Exam Constitutional:      General: He is not in acute distress.    Appearance: Normal appearance. He is normal weight.  HENT:     Head: Normocephalic and atraumatic.  Eyes:     General: No scleral icterus.    Extraocular Movements: Extraocular movements intact.     Conjunctiva/sclera: Conjunctivae normal.     Pupils: Pupils are equal, round, and reactive to light.  Cardiovascular:     Rate and Rhythm: Normal rate and regular rhythm.     Pulses: Normal pulses.     Heart sounds: Normal heart sounds. No murmur heard.    No friction rub. No gallop.  Pulmonary:     Effort: Pulmonary effort is normal. No respiratory distress.     Breath sounds: Normal breath sounds.   Abdominal:     General: Bowel sounds are normal. There is no distension.     Palpations: Abdomen is soft. There is no hepatomegaly, splenomegaly or mass.     Tenderness: There is no abdominal tenderness.  Musculoskeletal:        General: Normal range of motion.     Cervical back: Normal range of motion and neck supple.     Right lower leg: No edema.     Left lower leg: No edema.  Lymphadenopathy:     Cervical: No cervical adenopathy.  Skin:    General: Skin is warm and dry.  Neurological:     General: No focal deficit present.     Mental Status: He is alert and oriented to person, place, and time. Mental status is at baseline.  Psychiatric:        Mood and Affect: Mood normal.        Behavior: Behavior normal.        Thought Content: Thought content normal.        Judgment: Judgment normal.    LABS:    Latest Reference Range & Units 04/14/23 14:06  WBC 4.0 - 10.5 K/uL 9.2  RBC 4.22 - 5.81 MIL/uL 2.68 (L)  Hemoglobin 13.0 - 17.0 g/dL 8.5 (L)  HCT 84.6 - 96.2 % 26.8 (L)  MCV  80.0 - 100.0 fL 100.0  MCH 26.0 - 34.0 pg 31.7  MCHC 30.0 - 36.0 g/dL 16.1  RDW 09.6 - 04.5 % 17.6 (H)  Platelets 150 - 400 K/uL 151  nRBC 0.0 - 0.2 % 0 /100 WBC 0.00 0  Neutrophils % 40  Lymphocytes % 52  Monocytes Relative % 6  Eosinophil % 1  Basophil % 1  Immature Granulocytes % 0  (L): Data is abnormally low (H): Data is abnormally high    Latest Reference Range & Units 03/14/23 13:59 04/14/23 14:06  Iron 45 - 182 ug/dL 43 (L) 56  UIBC ug/dL 409 811  TIBC 914 - 782 ug/dL 956 213  Saturation Ratios 17.9 - 39.5 % 12 (L) 17 (L)  Ferritin 24 - 336 ng/mL 11 (L) 83  (L): Data is abnormally low ASSESSMENT & PLAN:  Assessment/Plan:  An 82 year old gentleman with chronic lymphocytic leukemia.   Overall, his white count remains only minimally elevated.  Overall, his acalabrutinib continues to keep his disease under control.  With respect to his anemia, despite receiving IV iron recently, his  iron levels remain low.  I will give him another course of iron over these next few weeks to replenish his iron stores and hopefully improve him hemoglobin.  Due to his persistent iron deficiency anemia, I will refer him to GI for a thorough workup.  Otherwise, I will see him back in 1 month for repeat clinical assessment.  The patient understands all the plans discussed today and is in agreement with them.    Diangelo Radel Kirby Funk, MD

## 2023-05-12 ENCOUNTER — Telehealth: Payer: Self-pay | Admitting: Oncology

## 2023-05-12 ENCOUNTER — Inpatient Hospital Stay: Payer: Medicare HMO

## 2023-05-12 ENCOUNTER — Other Ambulatory Visit: Payer: Self-pay | Admitting: Oncology

## 2023-05-12 ENCOUNTER — Inpatient Hospital Stay: Payer: Medicare HMO | Attending: Oncology | Admitting: Oncology

## 2023-05-12 ENCOUNTER — Encounter: Payer: Self-pay | Admitting: Oncology

## 2023-05-12 VITALS — BP 146/67 | HR 65 | Temp 98.0°F | Resp 16 | Ht 67.0 in | Wt 174.0 lb

## 2023-05-12 DIAGNOSIS — D508 Other iron deficiency anemias: Secondary | ICD-10-CM

## 2023-05-12 DIAGNOSIS — C9110 Chronic lymphocytic leukemia of B-cell type not having achieved remission: Secondary | ICD-10-CM

## 2023-05-12 DIAGNOSIS — D649 Anemia, unspecified: Secondary | ICD-10-CM | POA: Insufficient documentation

## 2023-05-12 LAB — CBC WITH DIFFERENTIAL (CANCER CENTER ONLY)
Abs Immature Granulocytes: 0.02 10*3/uL (ref 0.00–0.07)
Basophils Absolute: 0 10*3/uL (ref 0.0–0.1)
Basophils Relative: 1 %
Eosinophils Absolute: 0.1 10*3/uL (ref 0.0–0.5)
Eosinophils Relative: 2 %
HCT: 31.7 % — ABNORMAL LOW (ref 39.0–52.0)
Hemoglobin: 10.3 g/dL — ABNORMAL LOW (ref 13.0–17.0)
Immature Granulocytes: 0 %
Lymphocytes Relative: 28 %
Lymphs Abs: 1.6 10*3/uL (ref 0.7–4.0)
MCH: 30.7 pg (ref 26.0–34.0)
MCHC: 32.5 g/dL (ref 30.0–36.0)
MCV: 94.6 fL (ref 80.0–100.0)
Monocytes Absolute: 1.1 10*3/uL — ABNORMAL HIGH (ref 0.1–1.0)
Monocytes Relative: 20 %
Neutro Abs: 2.8 10*3/uL (ref 1.7–7.7)
Neutrophils Relative %: 49 %
Platelet Count: 139 10*3/uL — ABNORMAL LOW (ref 150–400)
RBC: 3.35 MIL/uL — ABNORMAL LOW (ref 4.22–5.81)
RDW: 14.5 % (ref 11.5–15.5)
WBC Count: 5.6 10*3/uL (ref 4.0–10.5)
nRBC: 0 % (ref 0.0–0.2)
nRBC: 0 /100{WBCs}

## 2023-05-12 LAB — IRON AND TIBC
Iron: 59 ug/dL (ref 45–182)
Saturation Ratios: 17 % — ABNORMAL LOW (ref 17.9–39.5)
TIBC: 340 ug/dL (ref 250–450)
UIBC: 281 ug/dL

## 2023-05-12 LAB — FERRITIN: Ferritin: 58 ng/mL (ref 24–336)

## 2023-05-12 NOTE — Telephone Encounter (Signed)
07/01/22 Next appt scheduled and confirmed with patient

## 2023-05-15 ENCOUNTER — Encounter: Payer: Self-pay | Admitting: Oncology

## 2023-05-15 NOTE — Progress Notes (Signed)
[facility]  [address] [place],  [place]  [address] [phone]  Clinic Day:  05/12/2023  Referring physician: Lise Auer, MD   HISTORY OF PRESENT ILLNESS:  The patient is a 82 y.o. 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  Due to his disease leading to significant anemia, he was placed on acalabrutinib 100 mg twice daily in late July 2022, which has led to disease improvement.  He also was recently found to be iron deficient again to where he received IV iron in October 2024.   However, at his last visit, his hemoglobin was found to be low again despite having normal iron levels.  Due to the presumption that his anemia was due to his acalabrutinib, this agent has been held for the past 4 weeks.  He comes in today to reassess his peripheral counts as the eye will be improved over these past weeks.  Overall, the patient claims to be doing well.  He denies having any significant fatigue at this time.  He also denies having any overt forms of blood loss.   PHYSICAL EXAM:  Blood pressure (!) 146/67, pulse 65, temperature 98 F (36.7 C), resp. rate 16, height 5\' 7"  (1.702 m), weight 174 lb (78.9 kg), SpO2 100%. Wt Readings from Last 3 Encounters:  05/12/23 174 lb (78.9 kg)  04/19/23 174 lb 2.6 oz (79 kg)  04/14/23 175 lb 14.4 oz (79.8 kg)   Body mass index is 27.25 kg/m. Performance status (ECOG): 1 - Symptomatic but completely ambulatory Physical Exam Constitutional:      Appearance: He is normal weight.    LABS:      Latest Ref Rng & Units 05/12/2023    1:50 PM 04/19/2023    2:57 PM 04/14/2023    2:06 PM  CBC  WBC 4.0 - 10.5 K/uL 5.6  4.5  9.2   Hemoglobin 13.0 - 17.0 g/dL 65.7  9.0  8.5   Hematocrit 39.0 - 52.0 % 31.7  29.3  26.8   Platelets 150 - 400 K/uL 139  138  151     Latest Reference Range & Units 05/12/23 13:50  Neutrophils % 49  Lymphocytes % 28  Monocytes  Relative % 20  Eosinophil % 2  Basophil % 1  Immature Granulocytes % 0      Latest Ref Rng & Units 04/19/2023    2:57 PM 11/12/2022    2:37 PM 03/28/2020   12:00 AM  CMP  Glucose 70 - 99 mg/dL 846  962    BUN 8 - 23 mg/dL 21  26  29       Creatinine 0.61 - 1.24 mg/dL 9.52  8.41  1.3      Sodium 135 - 145 mmol/L 140  140  140      Potassium 3.5 - 5.1 mmol/L 4.4  4.1  4.1      Chloride 98 - 111 mmol/L 105  106  106      CO2 22 - 32 mmol/L 27  27  28       Calcium 8.9 - 10.3 mg/dL 9.1  8.9  8.0      Total Protein 6.5 - 8.1 g/dL  6.4    Total Bilirubin 0.3 - 1.2 mg/dL  0.4    Alkaline Phos 38 - 126 U/L  41  57      AST 15 - 41 U/L  14  31  ALT 0 - 44 U/L  14  15         This result is from an external source.    Latest Reference Range & Units 05/12/23 13:51  Iron 45 - 182 ug/dL 59  UIBC ug/dL 664  TIBC 403 - 474 ug/dL 259  Saturation Ratios 17.9 - 39.5 % 17 (L)  Ferritin 24 - 336 ng/mL 58  (L): Data is abnormally low  ASSESSMENT & PLAN:  Assessment/Plan:  A 82 y.o. male with chronic lymphocytic leukemia.  I am pleased with the improvement in his hemoglobin since his acalabrutinib has been held, which has risen by nearly 2 grams.  I am pleased that his white count remains norma; his white count differential is also fine.  For now, his acalabrutinib will continue to be held.  I will see him back in 3 months to reassess his peripheral counts to ensure prolonged discontinuation of his acalabrutinib is not leading to any significantly abnormal hematologic changes.  The patient understands all the plans discussed today and is in agreement with them.    Lalla Laham Kirby Funk, MD

## 2023-05-17 NOTE — Discharge Instructions (Signed)

## 2023-05-18 ENCOUNTER — Other Ambulatory Visit: Payer: Medicare HMO

## 2023-05-18 ENCOUNTER — Inpatient Hospital Stay
Admission: RE | Admit: 2023-05-18 | Discharge: 2023-05-18 | Disposition: A | Discharge status: Home / Self Care | Payer: Medicare HMO | Source: Ambulatory Visit | Attending: Student in an Organized Health Care Education/Training Program | Admitting: Student in an Organized Health Care Education/Training Program

## 2023-05-27 NOTE — Discharge Instructions (Signed)

## 2023-05-29 ENCOUNTER — Other Ambulatory Visit: Payer: Self-pay | Admitting: Cardiology

## 2023-05-30 ENCOUNTER — Ambulatory Visit
Admission: RE | Admit: 2023-05-30 | Discharge: 2023-05-30 | Disposition: A | Discharge status: Home / Self Care | Payer: Medicare HMO | Source: Ambulatory Visit | Attending: Student in an Organized Health Care Education/Training Program | Admitting: Student in an Organized Health Care Education/Training Program

## 2023-05-30 DIAGNOSIS — M5117 Intervertebral disc disorders with radiculopathy, lumbosacral region: Secondary | ICD-10-CM | POA: Diagnosis not present

## 2023-05-30 DIAGNOSIS — M4807 Spinal stenosis, lumbosacral region: Secondary | ICD-10-CM | POA: Diagnosis not present

## 2023-05-30 DIAGNOSIS — M4726 Other spondylosis with radiculopathy, lumbar region: Secondary | ICD-10-CM

## 2023-05-30 DIAGNOSIS — M5416 Radiculopathy, lumbar region: Secondary | ICD-10-CM

## 2023-05-30 DIAGNOSIS — M48061 Spinal stenosis, lumbar region without neurogenic claudication: Secondary | ICD-10-CM | POA: Diagnosis not present

## 2023-05-30 MED ORDER — MEPERIDINE HCL 50 MG/ML IJ SOLN: 50.0000 mg | Freq: Once | INTRAMUSCULAR | Status: DC | PRN

## 2023-05-30 MED ORDER — IOPAMIDOL (ISOVUE-M 200) INJECTION 41%
20.0000 mL | Freq: Once | INTRAMUSCULAR | Status: AC
  Administered 2023-05-30: 20 mL via INTRATHECAL

## 2023-05-30 MED ORDER — ONDANSETRON HCL 4 MG/2ML IJ SOLN: 4.0000 mg | Freq: Once | INTRAMUSCULAR | Status: DC | PRN

## 2023-05-30 MED ORDER — DIAZEPAM 5 MG PO TABS: 5.0000 mg | ORAL_TABLET | Freq: Once | ORAL | Status: DC

## 2023-06-15 DIAGNOSIS — M4726 Other spondylosis with radiculopathy, lumbar region: Secondary | ICD-10-CM | POA: Diagnosis not present

## 2023-06-20 DIAGNOSIS — M5416 Radiculopathy, lumbar region: Secondary | ICD-10-CM | POA: Diagnosis not present

## 2023-07-04 ENCOUNTER — Ambulatory Visit (INDEPENDENT_AMBULATORY_CARE_PROVIDER_SITE_OTHER): Payer: Medicare Other

## 2023-07-04 DIAGNOSIS — I442 Atrioventricular block, complete: Secondary | ICD-10-CM | POA: Diagnosis not present

## 2023-07-05 DIAGNOSIS — L820 Inflamed seborrheic keratosis: Secondary | ICD-10-CM | POA: Diagnosis not present

## 2023-07-05 DIAGNOSIS — L570 Actinic keratosis: Secondary | ICD-10-CM | POA: Diagnosis not present

## 2023-07-05 DIAGNOSIS — L578 Other skin changes due to chronic exposure to nonionizing radiation: Secondary | ICD-10-CM | POA: Diagnosis not present

## 2023-07-05 DIAGNOSIS — L821 Other seborrheic keratosis: Secondary | ICD-10-CM | POA: Diagnosis not present

## 2023-07-05 LAB — CUP PACEART REMOTE DEVICE CHECK
Battery Impedance: 1617 Ohm
Battery Remaining Longevity: 58 mo
Battery Voltage: 2.77 V
Brady Statistic AP VP Percent: 1 %
Brady Statistic AP VS Percent: 0 %
Brady Statistic AS VP Percent: 1 %
Brady Statistic AS VS Percent: 98 %
Date Time Interrogation Session: 20250127162442
Implantable Lead Connection Status: 753985
Implantable Lead Connection Status: 753985
Implantable Lead Implant Date: 20020322
Implantable Lead Implant Date: 20030322
Implantable Lead Location: 753859
Implantable Lead Location: 753860
Implantable Lead Model: 5092
Implantable Lead Model: 5592
Implantable Pulse Generator Implant Date: 20110526
Lead Channel Impedance Value: 405 Ohm
Lead Channel Impedance Value: 413 Ohm
Lead Channel Pacing Threshold Amplitude: 0.625 V
Lead Channel Pacing Threshold Amplitude: 0.875 V
Lead Channel Pacing Threshold Pulse Width: 0.4 ms
Lead Channel Pacing Threshold Pulse Width: 0.4 ms
Lead Channel Setting Pacing Amplitude: 1.75 V
Lead Channel Setting Pacing Amplitude: 2 V
Lead Channel Setting Pacing Pulse Width: 0.4 ms
Lead Channel Setting Sensing Sensitivity: 2 mV
Zone Setting Status: 755011
Zone Setting Status: 755011

## 2023-07-22 DIAGNOSIS — M4726 Other spondylosis with radiculopathy, lumbar region: Secondary | ICD-10-CM | POA: Diagnosis not present

## 2023-08-09 DIAGNOSIS — M5416 Radiculopathy, lumbar region: Secondary | ICD-10-CM | POA: Diagnosis not present

## 2023-08-10 NOTE — Progress Notes (Signed)
 [facility]  [address] [place],  [place]  [address] [phone]  Clinic Day:  08/11/2022  Referring physician: Lise Auer, MD   HISTORY OF PRESENT ILLNESS:  The patient is a 83 y.o. 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  Due to his disease leading to significant anemia, he was placed on acalabrutinib 100 mg twice daily in late July 2022, which led to disease improvement.  He also was recently found to be iron deficient again to where he received IV iron in October 2024.   Despite receiving IV iron, this gentleman's hemoglobin remains low to where his acalabrutinib had been held since November 2024.  He comes in today to reassess his peripheral counts.  Overall, the patient claims to be doing well.  He denies having any B symptoms or bulky lymphadenopathy which concerns him for catabolic progression of his CLL since being off his acalabrutinib for these past months.  PHYSICAL EXAM:  Blood pressure (!) 141/60, pulse (!) 59, temperature 97.6 F (36.4 C), temperature source Oral, resp. rate 16, height 5\' 7"  (1.702 m), SpO2 100%. Wt Readings from Last 3 Encounters:  05/12/23 174 lb (78.9 kg)  04/19/23 174 lb 2.6 oz (79 kg)  04/14/23 175 lb 14.4 oz (79.8 kg)   Body mass index is 27.25 kg/m. Performance status (ECOG): 1 - Symptomatic but completely ambulatory Physical Exam Constitutional:      Appearance: Normal appearance. He is normal weight. He is not ill-appearing.     Comments: A moderately frail older gentleman in a wheelchair  HENT:     Mouth/Throat:     Mouth: Mucous membranes are moist.     Pharynx: Oropharynx is clear. No oropharyngeal exudate or posterior oropharyngeal erythema.  Cardiovascular:     Rate and Rhythm: Normal rate and regular rhythm.     Heart sounds: No murmur heard.    No friction rub. No gallop.  Pulmonary:     Effort: Pulmonary effort is normal.  No respiratory distress.     Breath sounds: Normal breath sounds. No wheezing, rhonchi or rales.  Abdominal:     General: Bowel sounds are normal. There is no distension.     Palpations: Abdomen is soft. There is no mass.     Tenderness: There is no abdominal tenderness.  Musculoskeletal:        General: No swelling.     Right lower leg: No edema.     Left lower leg: No edema.  Lymphadenopathy:     Cervical: No cervical adenopathy.     Upper Body:     Right upper body: No supraclavicular or axillary adenopathy.     Left upper body: No supraclavicular or axillary adenopathy.     Lower Body: No right inguinal adenopathy. No left inguinal adenopathy.  Skin:    General: Skin is warm.     Coloration: Skin is not jaundiced.     Findings: No lesion or rash.  Neurological:     General: No focal deficit present.     Mental Status: He is alert and oriented to person, place, and time. Mental status is at baseline.  Psychiatric:        Mood and Affect: Mood normal.        Behavior: Behavior normal.        Thought Content: Thought content normal.    LABS:      Latest Ref Rng &  Units 08/11/2023    2:01 PM 05/12/2023    1:50 PM 04/19/2023    2:57 PM  CBC  WBC 4.0 - 10.5 K/uL 6.1  5.6  4.5   Hemoglobin 13.0 - 17.0 g/dL 16.1  09.6  9.0   Hematocrit 39.0 - 52.0 % 35.0  31.7  29.3   Platelets 150 - 400 K/uL 146  139  138     Latest Reference Range & Units 08/11/23 14:01  Neutrophils % 44  Lymphocytes % 42  Monocytes Relative % 11  Eosinophil % 2  Basophil % 1  Immature Granulocytes % 0      Latest Ref Rng & Units 04/19/2023    2:57 PM 11/12/2022    2:37 PM 03/28/2020   12:00 AM  CMP  Glucose 70 - 99 mg/dL 045  409    BUN 8 - 23 mg/dL 21  26  29       Creatinine 0.61 - 1.24 mg/dL 8.11  9.14  1.3      Sodium 135 - 145 mmol/L 140  140  140      Potassium 3.5 - 5.1 mmol/L 4.4  4.1  4.1      Chloride 98 - 111 mmol/L 105  106  106      CO2 22 - 32 mmol/L 27  27  28       Calcium 8.9 -  10.3 mg/dL 9.1  8.9  8.0      Total Protein 6.5 - 8.1 g/dL  6.4    Total Bilirubin 0.3 - 1.2 mg/dL  0.4    Alkaline Phos 38 - 126 U/L  41  57      AST 15 - 41 U/L  14  31      ALT 0 - 44 U/L  14  15         This result is from an external source.   ASSESSMENT & PLAN:  Assessment/Plan:  An 83 y.o. male with chronic lymphocytic leukemia.  When evaluating his labs today, I am pleased with the continued improvement in his hemoglobin since his acalabrutinib has been held over the past 4 months.  Furthermore, his white count and white count differential are both normal.  Based upon all of his hematologic parameters being satisfactory, I do not believe it is necessary for his acalabrutinib to be restarted at this time.  It will continue to be held until significant hematologic changes develop in the future to where the medication would need to be reinitiated.  Otherwise, I will see the patient back in 4 months for repeat clinical assessment.  The patient understands all the plans discussed today and is in agreement with them.    Achilles Neville Kirby Funk, MD

## 2023-08-11 ENCOUNTER — Inpatient Hospital Stay: Payer: Medicare HMO | Attending: Oncology | Admitting: Oncology

## 2023-08-11 ENCOUNTER — Other Ambulatory Visit: Payer: Self-pay | Admitting: Oncology

## 2023-08-11 ENCOUNTER — Inpatient Hospital Stay: Payer: Medicare HMO

## 2023-08-11 VITALS — BP 141/60 | HR 59 | Temp 97.6°F | Resp 16 | Ht 67.0 in

## 2023-08-11 DIAGNOSIS — C9110 Chronic lymphocytic leukemia of B-cell type not having achieved remission: Secondary | ICD-10-CM

## 2023-08-11 LAB — CBC WITH DIFFERENTIAL (CANCER CENTER ONLY)
Abs Immature Granulocytes: 0.02 10*3/uL (ref 0.00–0.07)
Basophils Absolute: 0.1 10*3/uL (ref 0.0–0.1)
Basophils Relative: 1 %
Eosinophils Absolute: 0.1 10*3/uL (ref 0.0–0.5)
Eosinophils Relative: 2 %
HCT: 35 % — ABNORMAL LOW (ref 39.0–52.0)
Hemoglobin: 11.2 g/dL — ABNORMAL LOW (ref 13.0–17.0)
Immature Granulocytes: 0 %
Lymphocytes Relative: 42 %
Lymphs Abs: 2.6 10*3/uL (ref 0.7–4.0)
MCH: 29.1 pg (ref 26.0–34.0)
MCHC: 32 g/dL (ref 30.0–36.0)
MCV: 90.9 fL (ref 80.0–100.0)
Monocytes Absolute: 0.7 10*3/uL (ref 0.1–1.0)
Monocytes Relative: 11 %
Neutro Abs: 2.7 10*3/uL (ref 1.7–7.7)
Neutrophils Relative %: 44 %
Platelet Count: 146 10*3/uL — ABNORMAL LOW (ref 150–400)
RBC: 3.85 MIL/uL — ABNORMAL LOW (ref 4.22–5.81)
RDW: 14.2 % (ref 11.5–15.5)
WBC Count: 6.1 10*3/uL (ref 4.0–10.5)
nRBC: 0 % (ref 0.0–0.2)
nRBC: 0 /100{WBCs}

## 2023-08-12 ENCOUNTER — Encounter: Payer: Self-pay | Admitting: Oncology

## 2023-08-12 NOTE — Addendum Note (Signed)
 Addended by: Geralyn Flash D on: 08/12/2023 12:37 PM   Modules accepted: Orders

## 2023-08-12 NOTE — Progress Notes (Signed)
 Remote pacemaker transmission.

## 2023-08-25 ENCOUNTER — Other Ambulatory Visit: Payer: Self-pay | Admitting: Cardiology

## 2023-08-29 ENCOUNTER — Ambulatory Visit: Payer: Medicare HMO | Attending: Cardiology | Admitting: Cardiology

## 2023-08-29 NOTE — Progress Notes (Deleted)
  Electrophysiology Office Note:   Date:  08/29/2023  ID:  Scorpio Fortin, DOB 04-06-1941, MRN [id number]  Primary Cardiologist: Gypsy Balsam, MD Primary Heart Failure: None Electrophysiologist: Vendetta Pittinger Jorja Loa, MD  {Click to update primary MD,subspecialty MD or APP then REFRESH:1}    History of Present Illness:   Michael Aguirre is a 83 y.o. male with h/o transient complete heart block, hypertension seen today for routine electrophysiology followup.   Since last being seen in our clinic the patient reports doing ***.  he denies chest pain, palpitations, dyspnea, PND, orthopnea, nausea, vomiting, dizziness, syncope, edema, weight gain, or early satiety.   Review of systems complete and found to be negative unless listed in HPI.      EP Information / Studies Reviewed:    {EKGtoday:28818}      PPM Interrogation-  reviewed in detail today,  See PACEART report.  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   Wt Readings from Last 3 Encounters:  05/12/23 174 lb (78.9 kg)  04/19/23 174 lb 2.6 oz (79 kg)  04/14/23 175 lb 14.4 oz (79.8 kg)     GEN: Well nourished, well developed in no acute distress NECK: No JVD; No carotid bruits CARDIAC: {EPRHYTHM:28826}, no murmurs, rubs, gallops RESPIRATORY:  Clear to auscultation without rales, wheezing or rhonchi  ABDOMEN: Soft, non-tender, non-distended EXTREMITIES:  No edema; No deformity   ASSESSMENT AND PLAN:    CHB s/p Medtronic PPM  Normal PPM function See Pace Art report No changes today  2.  Hypertension:***  Disposition:   Follow up with {EPPROVIDERS:28135} {EPFOLLOW WU:98119}  Signed, Kermitt Harjo Jorja Loa, MD

## 2023-09-07 DIAGNOSIS — R351 Nocturia: Secondary | ICD-10-CM | POA: Diagnosis not present

## 2023-09-07 DIAGNOSIS — N401 Enlarged prostate with lower urinary tract symptoms: Secondary | ICD-10-CM | POA: Diagnosis not present

## 2023-09-07 DIAGNOSIS — Z125 Encounter for screening for malignant neoplasm of prostate: Secondary | ICD-10-CM | POA: Diagnosis not present

## 2023-09-09 DIAGNOSIS — M5416 Radiculopathy, lumbar region: Secondary | ICD-10-CM | POA: Diagnosis not present

## 2023-09-09 DIAGNOSIS — M4726 Other spondylosis with radiculopathy, lumbar region: Secondary | ICD-10-CM | POA: Diagnosis not present

## 2023-09-21 ENCOUNTER — Other Ambulatory Visit: Payer: Self-pay | Admitting: Cardiology

## 2023-10-03 ENCOUNTER — Ambulatory Visit (INDEPENDENT_AMBULATORY_CARE_PROVIDER_SITE_OTHER): Payer: Medicare Other

## 2023-10-03 DIAGNOSIS — I442 Atrioventricular block, complete: Secondary | ICD-10-CM | POA: Diagnosis not present

## 2023-10-05 LAB — CUP PACEART REMOTE DEVICE CHECK
Battery Impedance: 1609 Ohm
Battery Remaining Longevity: 59 mo
Battery Voltage: 2.76 V
Brady Statistic AP VP Percent: 1 %
Brady Statistic AP VS Percent: 0 %
Brady Statistic AS VP Percent: 1 %
Brady Statistic AS VS Percent: 98 %
Date Time Interrogation Session: 20250429160927
Implantable Lead Connection Status: 753985
Implantable Lead Connection Status: 753985
Implantable Lead Implant Date: 20020322
Implantable Lead Implant Date: 20030322
Implantable Lead Location: 753859
Implantable Lead Location: 753860
Implantable Lead Model: 5092
Implantable Lead Model: 5592
Implantable Pulse Generator Implant Date: 20110526
Lead Channel Impedance Value: 395 Ohm
Lead Channel Impedance Value: 448 Ohm
Lead Channel Pacing Threshold Amplitude: 0.625 V
Lead Channel Pacing Threshold Amplitude: 0.75 V
Lead Channel Pacing Threshold Pulse Width: 0.4 ms
Lead Channel Pacing Threshold Pulse Width: 0.4 ms
Lead Channel Setting Pacing Amplitude: 1.625
Lead Channel Setting Pacing Amplitude: 2 V
Lead Channel Setting Pacing Pulse Width: 0.4 ms
Lead Channel Setting Sensing Sensitivity: 2 mV
Zone Setting Status: 755011
Zone Setting Status: 755011

## 2023-10-07 ENCOUNTER — Other Ambulatory Visit: Payer: Self-pay | Admitting: Cardiology

## 2023-10-12 DIAGNOSIS — Z1330 Encounter for screening examination for mental health and behavioral disorders, unspecified: Secondary | ICD-10-CM | POA: Diagnosis not present

## 2023-10-12 DIAGNOSIS — M5416 Radiculopathy, lumbar region: Secondary | ICD-10-CM | POA: Diagnosis not present

## 2023-10-27 ENCOUNTER — Other Ambulatory Visit: Payer: Self-pay | Admitting: Cardiology

## 2023-11-06 DIAGNOSIS — R110 Nausea: Secondary | ICD-10-CM | POA: Insufficient documentation

## 2023-11-06 DIAGNOSIS — G629 Polyneuropathy, unspecified: Secondary | ICD-10-CM | POA: Insufficient documentation

## 2023-11-06 DIAGNOSIS — I2510 Atherosclerotic heart disease of native coronary artery without angina pectoris: Secondary | ICD-10-CM | POA: Insufficient documentation

## 2023-11-14 DIAGNOSIS — M5416 Radiculopathy, lumbar region: Secondary | ICD-10-CM | POA: Diagnosis not present

## 2023-11-17 ENCOUNTER — Telehealth: Payer: Self-pay | Admitting: Cardiology

## 2023-11-17 MED ORDER — ISOSORBIDE MONONITRATE ER 30 MG PO TB24: 30.0000 mg | ORAL_TABLET | Freq: Every day | ORAL | 0 refills | Status: DC

## 2023-11-17 NOTE — Telephone Encounter (Signed)
*  STAT* If patient is at the pharmacy, call can be transferred to refill team.   1. Which medications need to be refilled? (please list name of each medication and dose if known)   isosorbide  mononitrate (IMDUR ) 30 MG 24 hr tablet     2. Would you like to learn more about the convenience, safety, & potential cost savings by using the [facility]? N/A    3. Are you open to using the [facility] (Type [facility]. N/A   4. Which pharmacy/location (including street and city if local pharmacy) is medication to be sent to?  [id number] - [place], [place] - [address]     5. Do they need a 30 day or 90 day supply? Needs enough to last until 09/02 appt. Patient is completely out of medication.

## 2023-11-17 NOTE — Telephone Encounter (Signed)
 RX sent to requested Pharmacy

## 2023-11-23 NOTE — Addendum Note (Signed)
 Addended by: Edra Govern D on: 11/23/2023 11:52 AM   Modules accepted: Orders

## 2023-11-23 NOTE — Progress Notes (Signed)
 Remote pacemaker transmission.

## 2023-12-11 NOTE — Progress Notes (Signed)
 [facility]  [address] [place],  [place]  [address] [phone]  Clinic Day:  12/12/2023  Referring physician: Fernand Tracey LABOR, MD   HISTORY OF PRESENT ILLNESS:  The patient is a 83 y.o. 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  Due to his disease leading to significant anemia, he was placed on acalabrutinib 100 mg twice daily in late July 2022, which led to disease improvement.  He also was recently found to be iron  deficient again to where he received IV iron  in October 2024.   Despite receiving IV iron , this gentleman's hemoglobin has remained low to where his acalabrutinib had been held since November 2024.  He comes in today to reassess his peripheral counts.  Overall, the patient claims to be doing well.  He denies having any B symptoms or bulky lymphadenopathy which concerns him for catabolic progression of his CLL since being off his acalabrutinib for these past months.  PHYSICAL EXAM:  Blood pressure (!) 88/46, pulse 73, temperature 98.3 F (36.8 C), temperature source Oral, resp. rate 14, height 5' 7 (1.702 m), weight 180 lb 1.6 oz (81.7 kg), SpO2 94%. Wt Readings from Last 3 Encounters:  12/12/23 180 lb 1.6 oz (81.7 kg)  05/12/23 174 lb (78.9 kg)  04/19/23 174 lb 2.6 oz (79 kg)   Body mass index is 28.21 kg/m. Performance status (ECOG): 1 - Symptomatic but completely ambulatory Physical Exam Constitutional:      Appearance: Normal appearance. He is normal weight. He is not ill-appearing.     Comments: A moderately frail older gentleman in a wheelchair  HENT:     Mouth/Throat:     Mouth: Mucous membranes are moist.     Pharynx: Oropharynx is clear. No oropharyngeal exudate or posterior oropharyngeal erythema.  Cardiovascular:     Rate and Rhythm: Normal rate and regular rhythm.     Heart sounds: No murmur heard.    No friction rub. No gallop.  Pulmonary:      Effort: Pulmonary effort is normal. No respiratory distress.     Breath sounds: Normal breath sounds. No wheezing, rhonchi or rales.  Abdominal:     General: Bowel sounds are normal. There is no distension.     Palpations: Abdomen is soft. There is no mass.     Tenderness: There is no abdominal tenderness.  Musculoskeletal:        General: No swelling.     Right lower leg: No edema.     Left lower leg: No edema.  Lymphadenopathy:     Cervical: No cervical adenopathy.     Upper Body:     Right upper body: No supraclavicular or axillary adenopathy.     Left upper body: No supraclavicular or axillary adenopathy.     Lower Body: No right inguinal adenopathy. No left inguinal adenopathy.  Skin:    General: Skin is warm.     Coloration: Skin is not jaundiced.     Findings: No lesion or rash.  Neurological:     General: No focal deficit present.     Mental Status: He is alert and oriented to person, place, and time. Mental status is at baseline.  Psychiatric:        Mood and Affect: Mood normal.        Behavior: Behavior normal.        Thought Content: Thought content normal.    LABS:  Latest Ref Rng & Units 12/12/2023    2:18 PM 08/11/2023    2:01 PM 05/12/2023    1:50 PM  CBC  WBC 4.0 - 10.5 K/uL 5.9  6.1  5.6   Hemoglobin 13.0 - 17.0 g/dL 88.0  88.7  89.6   Hematocrit 39.0 - 52.0 % 36.6  35.0  31.7   Platelets 150 - 400 K/uL 145  146  139     Latest Reference Range & Units 12/12/23 14:18  Neutrophils % 40  Lymphocytes % 46  Monocytes Relative % 10  Eosinophil % 3  Basophil % 1  Immature Granulocytes % 0      Latest Ref Rng & Units 04/19/2023    2:57 PM 11/12/2022    2:37 PM 03/28/2020   12:00 AM  CMP  Glucose 70 - 99 mg/dL 847  892    BUN 8 - 23 mg/dL 21  26  29       Creatinine 0.61 - 1.24 mg/dL 8.34  8.64  1.3      Sodium 135 - 145 mmol/L 140  140  140      Potassium 3.5 - 5.1 mmol/L 4.4  4.1  4.1      Chloride 98 - 111 mmol/L 105  106  106      CO2 22 - 32  mmol/L 27  27  28       Calcium  8.9 - 10.3 mg/dL 9.1  8.9  8.0      Total Protein 6.5 - 8.1 g/dL  6.4    Total Bilirubin 0.3 - 1.2 mg/dL  0.4    Alkaline Phos 38 - 126 U/L  41  57      AST 15 - 41 U/L  14  31      ALT 0 - 44 U/L  14  15         This result is from an external source.   ASSESSMENT & PLAN:  Assessment/Plan:  An 83 y.o. male with chronic lymphocytic leukemia.  When evaluating his labs today, I am pleased with the continued improvement in his hemoglobin since his acalabrutinib has been held over the past 8 months.  Furthermore, his total white count is normal.  Based upon all of his hematologic parameters looking fine, I do not believe it is necessary for his acalabrutinib to be restarted at this time.  It will continue to be held until significant hematologic changes develop in the future to where this medication would need to be reinitiated.  Otherwise, I will see the patient back in another 4 months for repeat clinical assessment.  The patient understands all the plans discussed today and is in agreement with them.    Michael Aguirre DELENA Kerns, MD

## 2023-12-12 ENCOUNTER — Inpatient Hospital Stay: Attending: Oncology | Admitting: Oncology

## 2023-12-12 ENCOUNTER — Other Ambulatory Visit: Payer: Self-pay | Admitting: Oncology

## 2023-12-12 ENCOUNTER — Telehealth: Payer: Self-pay

## 2023-12-12 ENCOUNTER — Other Ambulatory Visit: Payer: Self-pay

## 2023-12-12 ENCOUNTER — Inpatient Hospital Stay

## 2023-12-12 VITALS — BP 88/46 | HR 73 | Temp 98.3°F | Resp 14 | Ht 67.0 in | Wt 180.1 lb

## 2023-12-12 DIAGNOSIS — C9110 Chronic lymphocytic leukemia of B-cell type not having achieved remission: Secondary | ICD-10-CM | POA: Diagnosis not present

## 2023-12-12 LAB — CBC WITH DIFFERENTIAL (CANCER CENTER ONLY)
Abs Immature Granulocytes: 0.01 K/uL (ref 0.00–0.07)
Basophils Absolute: 0 K/uL (ref 0.0–0.1)
Basophils Relative: 1 %
Eosinophils Absolute: 0.2 K/uL (ref 0.0–0.5)
Eosinophils Relative: 3 %
HCT: 36.6 % — ABNORMAL LOW (ref 39.0–52.0)
Hemoglobin: 11.9 g/dL — ABNORMAL LOW (ref 13.0–17.0)
Immature Granulocytes: 0 %
Immature Platelet Fraction: 3.2 % (ref 1.2–8.6)
Lymphocytes Relative: 46 %
Lymphs Abs: 2.7 K/uL (ref 0.7–4.0)
MCH: 29.1 pg (ref 26.0–34.0)
MCHC: 32.5 g/dL (ref 30.0–36.0)
MCV: 89.5 fL (ref 80.0–100.0)
Monocytes Absolute: 0.6 K/uL (ref 0.1–1.0)
Monocytes Relative: 10 %
Neutro Abs: 2.4 K/uL (ref 1.7–7.7)
Neutrophils Relative %: 40 %
Platelet Count: 145 K/uL — ABNORMAL LOW (ref 150–400)
RBC: 4.09 MIL/uL — ABNORMAL LOW (ref 4.22–5.81)
RDW: 13.9 % (ref 11.5–15.5)
WBC Count: 5.9 K/uL (ref 4.0–10.5)
nRBC: 0 % (ref 0.0–0.2)

## 2023-12-12 NOTE — Telephone Encounter (Signed)
 Pt's wife, Michael Aguirre, states Michael Aguirre's BP was 90-60 when they got home. She wont give him a BP pill in morning.  Dr Ezzard and Madelin Samie LATHER, notified of above.

## 2023-12-13 ENCOUNTER — Encounter: Payer: Self-pay | Admitting: Oncology

## 2023-12-14 DIAGNOSIS — M5416 Radiculopathy, lumbar region: Secondary | ICD-10-CM | POA: Diagnosis not present

## 2023-12-14 DIAGNOSIS — M5451 Vertebrogenic low back pain: Secondary | ICD-10-CM | POA: Diagnosis not present

## 2023-12-14 DIAGNOSIS — M5126 Other intervertebral disc displacement, lumbar region: Secondary | ICD-10-CM | POA: Diagnosis not present

## 2023-12-30 ENCOUNTER — Encounter: Payer: Self-pay | Admitting: Oncology

## 2024-01-02 ENCOUNTER — Ambulatory Visit: Attending: Cardiology | Admitting: Cardiology

## 2024-01-02 ENCOUNTER — Ambulatory Visit (INDEPENDENT_AMBULATORY_CARE_PROVIDER_SITE_OTHER): Payer: Medicare Other

## 2024-01-02 ENCOUNTER — Encounter: Payer: Self-pay | Admitting: Cardiology

## 2024-01-02 ENCOUNTER — Ambulatory Visit: Payer: Self-pay | Admitting: Cardiology

## 2024-01-02 VITALS — BP 140/74 | HR 72 | Ht 67.0 in | Wt 183.6 lb

## 2024-01-02 DIAGNOSIS — I442 Atrioventricular block, complete: Secondary | ICD-10-CM | POA: Diagnosis not present

## 2024-01-02 DIAGNOSIS — I10 Essential (primary) hypertension: Secondary | ICD-10-CM

## 2024-01-02 DIAGNOSIS — L820 Inflamed seborrheic keratosis: Secondary | ICD-10-CM | POA: Diagnosis not present

## 2024-01-02 DIAGNOSIS — L578 Other skin changes due to chronic exposure to nonionizing radiation: Secondary | ICD-10-CM | POA: Diagnosis not present

## 2024-01-02 DIAGNOSIS — C44311 Basal cell carcinoma of skin of nose: Secondary | ICD-10-CM | POA: Diagnosis not present

## 2024-01-02 DIAGNOSIS — L570 Actinic keratosis: Secondary | ICD-10-CM | POA: Diagnosis not present

## 2024-01-02 DIAGNOSIS — L821 Other seborrheic keratosis: Secondary | ICD-10-CM | POA: Diagnosis not present

## 2024-01-02 LAB — CUP PACEART INCLINIC DEVICE CHECK
Date Time Interrogation Session: 20250728133709
Implantable Lead Connection Status: 753985
Implantable Lead Connection Status: 753985
Implantable Lead Implant Date: 20020322
Implantable Lead Implant Date: 20030322
Implantable Lead Location: 753859
Implantable Lead Location: 753860
Implantable Lead Model: 5092
Implantable Lead Model: 5592
Implantable Pulse Generator Implant Date: 20110526
Lead Channel Impedance Value: 421 Ohm
Lead Channel Impedance Value: 460 Ohm
Lead Channel Pacing Threshold Amplitude: 0.75 V
Lead Channel Pacing Threshold Amplitude: 0.75 V
Lead Channel Pacing Threshold Pulse Width: 0.4 ms
Lead Channel Pacing Threshold Pulse Width: 0.4 ms
Lead Channel Sensing Intrinsic Amplitude: 1 mV
Lead Channel Sensing Intrinsic Amplitude: 4 mV

## 2024-01-02 NOTE — Patient Instructions (Signed)
 Medication Instructions:  Your physician recommends that you continue on your current medications as directed. Please refer to the Current Medication list given to you today.  *If you need a refill on your cardiac medications before your next appointment, please call your pharmacy*  Lab Work: None ordered  If you have any lab test that is abnormal or we need to change your treatment, we will call you to review the results.  Testing/Procedures: None ordered  Follow-Up: At [facility], you and your health needs are our priority.  As part of our continuing mission to provide you with exceptional heart care, our providers are all part of one team.  This team includes your primary Cardiologist (physician) and Advanced Practice Providers or APPs (Physician Assistants and Nurse Practitioners) who all work together to provide you with the care you need, when you need it.  Your next appointment:   2 year(s)  Provider:   Agatha Horsfall, MD     Thank you for choosing [facility]!!   Reece Cane, RN [phone]

## 2024-01-02 NOTE — Progress Notes (Signed)
  Electrophysiology Office Note:   Date:  01/02/2024  ID:  Michael Aguirre, DOB Oct 11, 1940, MRN [id number]  Primary Cardiologist: Lamar Fitch, MD Primary Heart Failure: None Electrophysiologist: Michael Guyett Gladis Norton, MD      History of Present Illness:   Michael Aguirre is a 83 y.o. male with h/o complete heart block, CLL, hypertension seen today for routine electrophysiology followup.   Since last being seen in our clinic the patient reports doing overall well.  He has no chest pain and no shortness of breath.  He is fatigued, but this is his baseline.  He has no acute complaints.  he denies chest pain, palpitations, dyspnea, PND, orthopnea, nausea, vomiting, dizziness, syncope, edema, weight gain, or early satiety.   Review of systems complete and found to be negative unless listed in HPI.      EP Information / Studies Reviewed:    EKG is ordered today. Personal review as below.      PPM Interrogation-  reviewed in detail today,  See PACEART report.  Device History: Abbott Dual Chamber PPM implanted for CHB  Risk Assessment/Calculations:            Physical Exam:   VS:  BP (!) 140/74   Pulse 72   Ht 5' 7 (1.702 m)   Wt 183 lb 9.6 oz (83.3 kg)   SpO2 94%   BMI 28.76 kg/m    Wt Readings from Last 3 Encounters:  01/02/24 183 lb 9.6 oz (83.3 kg)  12/12/23 180 lb 1.6 oz (81.7 kg)  05/12/23 174 lb (78.9 kg)     GEN: Well nourished, well developed in no acute distress NECK: No JVD; No carotid bruits CARDIAC: Regular rate and rhythm, no murmurs, rubs, gallops RESPIRATORY:  Clear to auscultation without rales, wheezing or rhonchi  ABDOMEN: Soft, non-tender, non-distended EXTREMITIES:  No edema; No deformity   ASSESSMENT AND PLAN:    CHB s/p Abbott PPM  Normal PPM function Sensing, threshold, impedance within normal limits Programming reviewed and appropriate See Pace Art report No changes today.  2.  Hypertension: Well-controlled.  Plan per primary  physician and primary cardiology  Disposition:   Follow up with Dr. Norton 2 years   Signed, Michael Remer Gladis Norton, MD

## 2024-01-03 LAB — CUP PACEART REMOTE DEVICE CHECK
Battery Impedance: 2008 Ohm
Battery Remaining Longevity: 48 mo
Battery Voltage: 2.76 V
Brady Statistic AP VP Percent: 1 %
Brady Statistic AP VS Percent: 0 %
Brady Statistic AS VP Percent: 1 %
Brady Statistic AS VS Percent: 98 %
Date Time Interrogation Session: 20250728103553
Implantable Lead Connection Status: 753985
Implantable Lead Connection Status: 753985
Implantable Lead Implant Date: 20020322
Implantable Lead Implant Date: 20030322
Implantable Lead Location: 753859
Implantable Lead Location: 753860
Implantable Lead Model: 5092
Implantable Lead Model: 5592
Implantable Pulse Generator Implant Date: 20110526
Lead Channel Impedance Value: 395 Ohm
Lead Channel Impedance Value: 413 Ohm
Lead Channel Pacing Threshold Amplitude: 0.75 V
Lead Channel Pacing Threshold Amplitude: 0.75 V
Lead Channel Pacing Threshold Pulse Width: 0.4 ms
Lead Channel Pacing Threshold Pulse Width: 0.4 ms
Lead Channel Setting Pacing Amplitude: 1.5 V
Lead Channel Setting Pacing Amplitude: 2 V
Lead Channel Setting Pacing Pulse Width: 0.4 ms
Lead Channel Setting Sensing Sensitivity: 2 mV
Zone Setting Status: 755011
Zone Setting Status: 755011

## 2024-01-04 ENCOUNTER — Ambulatory Visit: Payer: Self-pay | Admitting: Cardiology

## 2024-01-11 DIAGNOSIS — T63461A Toxic effect of venom of wasps, accidental (unintentional), initial encounter: Secondary | ICD-10-CM | POA: Diagnosis not present

## 2024-01-11 DIAGNOSIS — W57XXXA Bitten or stung by nonvenomous insect and other nonvenomous arthropods, initial encounter: Secondary | ICD-10-CM | POA: Diagnosis not present

## 2024-02-03 DIAGNOSIS — M5430 Sciatica, unspecified side: Secondary | ICD-10-CM | POA: Insufficient documentation

## 2024-02-06 DIAGNOSIS — N189 Chronic kidney disease, unspecified: Secondary | ICD-10-CM | POA: Diagnosis not present

## 2024-02-06 DIAGNOSIS — R32 Unspecified urinary incontinence: Secondary | ICD-10-CM | POA: Diagnosis not present

## 2024-02-06 DIAGNOSIS — R231 Pallor: Secondary | ICD-10-CM | POA: Diagnosis not present

## 2024-02-06 DIAGNOSIS — R55 Syncope and collapse: Secondary | ICD-10-CM | POA: Diagnosis not present

## 2024-02-06 DIAGNOSIS — R001 Bradycardia, unspecified: Secondary | ICD-10-CM | POA: Diagnosis not present

## 2024-02-07 ENCOUNTER — Ambulatory Visit: Attending: Cardiology | Admitting: Cardiology

## 2024-02-07 DIAGNOSIS — D649 Anemia, unspecified: Secondary | ICD-10-CM | POA: Diagnosis not present

## 2024-02-07 DIAGNOSIS — D698 Other specified hemorrhagic conditions: Secondary | ICD-10-CM | POA: Diagnosis not present

## 2024-02-07 DIAGNOSIS — I4430 Unspecified atrioventricular block: Secondary | ICD-10-CM

## 2024-02-07 DIAGNOSIS — I10 Essential (primary) hypertension: Secondary | ICD-10-CM

## 2024-02-07 DIAGNOSIS — R55 Syncope and collapse: Secondary | ICD-10-CM | POA: Diagnosis not present

## 2024-02-08 DIAGNOSIS — D649 Anemia, unspecified: Secondary | ICD-10-CM | POA: Diagnosis not present

## 2024-02-08 DIAGNOSIS — I4430 Unspecified atrioventricular block: Secondary | ICD-10-CM | POA: Diagnosis not present

## 2024-02-08 DIAGNOSIS — D696 Thrombocytopenia, unspecified: Secondary | ICD-10-CM | POA: Diagnosis not present

## 2024-02-08 DIAGNOSIS — R55 Syncope and collapse: Secondary | ICD-10-CM | POA: Diagnosis not present

## 2024-02-08 DIAGNOSIS — I10 Essential (primary) hypertension: Secondary | ICD-10-CM | POA: Diagnosis not present

## 2024-02-13 DIAGNOSIS — Z23 Encounter for immunization: Secondary | ICD-10-CM | POA: Diagnosis not present

## 2024-02-13 DIAGNOSIS — Z6827 Body mass index (BMI) 27.0-27.9, adult: Secondary | ICD-10-CM | POA: Diagnosis not present

## 2024-02-13 DIAGNOSIS — R55 Syncope and collapse: Secondary | ICD-10-CM | POA: Diagnosis not present

## 2024-02-13 DIAGNOSIS — Z950 Presence of cardiac pacemaker: Secondary | ICD-10-CM | POA: Diagnosis not present

## 2024-02-15 DIAGNOSIS — G629 Polyneuropathy, unspecified: Secondary | ICD-10-CM | POA: Diagnosis not present

## 2024-02-15 DIAGNOSIS — M5030 Other cervical disc degeneration, unspecified cervical region: Secondary | ICD-10-CM | POA: Diagnosis not present

## 2024-02-15 DIAGNOSIS — Z87442 Personal history of urinary calculi: Secondary | ICD-10-CM | POA: Diagnosis not present

## 2024-02-15 DIAGNOSIS — I700 Atherosclerosis of aorta: Secondary | ICD-10-CM | POA: Diagnosis not present

## 2024-02-15 DIAGNOSIS — D696 Thrombocytopenia, unspecified: Secondary | ICD-10-CM | POA: Diagnosis not present

## 2024-02-15 DIAGNOSIS — M4726 Other spondylosis with radiculopathy, lumbar region: Secondary | ICD-10-CM | POA: Diagnosis not present

## 2024-02-15 DIAGNOSIS — F419 Anxiety disorder, unspecified: Secondary | ICD-10-CM | POA: Diagnosis not present

## 2024-02-15 DIAGNOSIS — I495 Sick sinus syndrome: Secondary | ICD-10-CM | POA: Diagnosis not present

## 2024-02-15 DIAGNOSIS — E039 Hypothyroidism, unspecified: Secondary | ICD-10-CM | POA: Diagnosis not present

## 2024-02-15 DIAGNOSIS — E0590 Thyrotoxicosis, unspecified without thyrotoxic crisis or storm: Secondary | ICD-10-CM | POA: Diagnosis not present

## 2024-02-15 DIAGNOSIS — D631 Anemia in chronic kidney disease: Secondary | ICD-10-CM | POA: Diagnosis not present

## 2024-02-15 DIAGNOSIS — K5730 Diverticulosis of large intestine without perforation or abscess without bleeding: Secondary | ICD-10-CM | POA: Diagnosis not present

## 2024-02-15 DIAGNOSIS — Z556 Problems related to health literacy: Secondary | ICD-10-CM | POA: Diagnosis not present

## 2024-02-15 DIAGNOSIS — H9190 Unspecified hearing loss, unspecified ear: Secondary | ICD-10-CM | POA: Diagnosis not present

## 2024-02-15 DIAGNOSIS — N183 Chronic kidney disease, stage 3 unspecified: Secondary | ICD-10-CM | POA: Diagnosis not present

## 2024-02-15 DIAGNOSIS — I071 Rheumatic tricuspid insufficiency: Secondary | ICD-10-CM | POA: Diagnosis not present

## 2024-02-15 DIAGNOSIS — I442 Atrioventricular block, complete: Secondary | ICD-10-CM | POA: Diagnosis not present

## 2024-02-15 DIAGNOSIS — I129 Hypertensive chronic kidney disease with stage 1 through stage 4 chronic kidney disease, or unspecified chronic kidney disease: Secondary | ICD-10-CM | POA: Diagnosis not present

## 2024-02-15 DIAGNOSIS — G8929 Other chronic pain: Secondary | ICD-10-CM | POA: Diagnosis not present

## 2024-02-15 DIAGNOSIS — M5116 Intervertebral disc disorders with radiculopathy, lumbar region: Secondary | ICD-10-CM | POA: Diagnosis not present

## 2024-02-15 DIAGNOSIS — N39498 Other specified urinary incontinence: Secondary | ICD-10-CM | POA: Diagnosis not present

## 2024-02-15 DIAGNOSIS — N401 Enlarged prostate with lower urinary tract symptoms: Secondary | ICD-10-CM | POA: Diagnosis not present

## 2024-02-15 DIAGNOSIS — M47812 Spondylosis without myelopathy or radiculopathy, cervical region: Secondary | ICD-10-CM | POA: Diagnosis not present

## 2024-02-15 DIAGNOSIS — Z950 Presence of cardiac pacemaker: Secondary | ICD-10-CM | POA: Diagnosis not present

## 2024-02-15 DIAGNOSIS — J310 Chronic rhinitis: Secondary | ICD-10-CM | POA: Diagnosis not present

## 2024-02-21 ENCOUNTER — Other Ambulatory Visit: Payer: Self-pay | Admitting: Cardiology

## 2024-02-23 DIAGNOSIS — N401 Enlarged prostate with lower urinary tract symptoms: Secondary | ICD-10-CM | POA: Diagnosis not present

## 2024-02-23 DIAGNOSIS — H9190 Unspecified hearing loss, unspecified ear: Secondary | ICD-10-CM | POA: Diagnosis not present

## 2024-02-23 DIAGNOSIS — F419 Anxiety disorder, unspecified: Secondary | ICD-10-CM | POA: Diagnosis not present

## 2024-02-23 DIAGNOSIS — M47812 Spondylosis without myelopathy or radiculopathy, cervical region: Secondary | ICD-10-CM | POA: Diagnosis not present

## 2024-02-23 DIAGNOSIS — D696 Thrombocytopenia, unspecified: Secondary | ICD-10-CM | POA: Diagnosis not present

## 2024-02-23 DIAGNOSIS — E039 Hypothyroidism, unspecified: Secondary | ICD-10-CM | POA: Diagnosis not present

## 2024-02-23 DIAGNOSIS — G8929 Other chronic pain: Secondary | ICD-10-CM | POA: Diagnosis not present

## 2024-02-23 DIAGNOSIS — K5730 Diverticulosis of large intestine without perforation or abscess without bleeding: Secondary | ICD-10-CM | POA: Diagnosis not present

## 2024-02-23 DIAGNOSIS — M4726 Other spondylosis with radiculopathy, lumbar region: Secondary | ICD-10-CM | POA: Diagnosis not present

## 2024-02-23 DIAGNOSIS — Z950 Presence of cardiac pacemaker: Secondary | ICD-10-CM | POA: Diagnosis not present

## 2024-02-23 DIAGNOSIS — D631 Anemia in chronic kidney disease: Secondary | ICD-10-CM | POA: Diagnosis not present

## 2024-02-23 DIAGNOSIS — Z87442 Personal history of urinary calculi: Secondary | ICD-10-CM | POA: Diagnosis not present

## 2024-02-23 DIAGNOSIS — J310 Chronic rhinitis: Secondary | ICD-10-CM | POA: Diagnosis not present

## 2024-02-23 DIAGNOSIS — N39498 Other specified urinary incontinence: Secondary | ICD-10-CM | POA: Diagnosis not present

## 2024-02-23 DIAGNOSIS — Z556 Problems related to health literacy: Secondary | ICD-10-CM | POA: Diagnosis not present

## 2024-02-23 DIAGNOSIS — I700 Atherosclerosis of aorta: Secondary | ICD-10-CM | POA: Diagnosis not present

## 2024-02-23 DIAGNOSIS — M5030 Other cervical disc degeneration, unspecified cervical region: Secondary | ICD-10-CM | POA: Diagnosis not present

## 2024-02-23 DIAGNOSIS — I129 Hypertensive chronic kidney disease with stage 1 through stage 4 chronic kidney disease, or unspecified chronic kidney disease: Secondary | ICD-10-CM | POA: Diagnosis not present

## 2024-02-23 DIAGNOSIS — I495 Sick sinus syndrome: Secondary | ICD-10-CM | POA: Diagnosis not present

## 2024-02-23 DIAGNOSIS — G629 Polyneuropathy, unspecified: Secondary | ICD-10-CM | POA: Diagnosis not present

## 2024-02-23 DIAGNOSIS — E0590 Thyrotoxicosis, unspecified without thyrotoxic crisis or storm: Secondary | ICD-10-CM | POA: Diagnosis not present

## 2024-02-23 DIAGNOSIS — I071 Rheumatic tricuspid insufficiency: Secondary | ICD-10-CM | POA: Diagnosis not present

## 2024-02-23 DIAGNOSIS — N183 Chronic kidney disease, stage 3 unspecified: Secondary | ICD-10-CM | POA: Diagnosis not present

## 2024-02-23 DIAGNOSIS — I442 Atrioventricular block, complete: Secondary | ICD-10-CM | POA: Diagnosis not present

## 2024-02-23 DIAGNOSIS — M5116 Intervertebral disc disorders with radiculopathy, lumbar region: Secondary | ICD-10-CM | POA: Diagnosis not present

## 2024-02-28 DIAGNOSIS — E0590 Thyrotoxicosis, unspecified without thyrotoxic crisis or storm: Secondary | ICD-10-CM | POA: Diagnosis not present

## 2024-02-28 DIAGNOSIS — M5030 Other cervical disc degeneration, unspecified cervical region: Secondary | ICD-10-CM | POA: Diagnosis not present

## 2024-02-28 DIAGNOSIS — H9190 Unspecified hearing loss, unspecified ear: Secondary | ICD-10-CM | POA: Diagnosis not present

## 2024-02-28 DIAGNOSIS — K5730 Diverticulosis of large intestine without perforation or abscess without bleeding: Secondary | ICD-10-CM | POA: Diagnosis not present

## 2024-02-28 DIAGNOSIS — Z556 Problems related to health literacy: Secondary | ICD-10-CM | POA: Diagnosis not present

## 2024-02-28 DIAGNOSIS — N183 Chronic kidney disease, stage 3 unspecified: Secondary | ICD-10-CM | POA: Diagnosis not present

## 2024-02-28 DIAGNOSIS — D631 Anemia in chronic kidney disease: Secondary | ICD-10-CM | POA: Diagnosis not present

## 2024-02-28 DIAGNOSIS — M5116 Intervertebral disc disorders with radiculopathy, lumbar region: Secondary | ICD-10-CM | POA: Diagnosis not present

## 2024-02-28 DIAGNOSIS — M47812 Spondylosis without myelopathy or radiculopathy, cervical region: Secondary | ICD-10-CM | POA: Diagnosis not present

## 2024-02-28 DIAGNOSIS — I129 Hypertensive chronic kidney disease with stage 1 through stage 4 chronic kidney disease, or unspecified chronic kidney disease: Secondary | ICD-10-CM | POA: Diagnosis not present

## 2024-02-28 DIAGNOSIS — N401 Enlarged prostate with lower urinary tract symptoms: Secondary | ICD-10-CM | POA: Diagnosis not present

## 2024-02-28 DIAGNOSIS — E039 Hypothyroidism, unspecified: Secondary | ICD-10-CM | POA: Diagnosis not present

## 2024-02-28 DIAGNOSIS — Z87442 Personal history of urinary calculi: Secondary | ICD-10-CM | POA: Diagnosis not present

## 2024-02-28 DIAGNOSIS — I071 Rheumatic tricuspid insufficiency: Secondary | ICD-10-CM | POA: Diagnosis not present

## 2024-02-28 DIAGNOSIS — N39498 Other specified urinary incontinence: Secondary | ICD-10-CM | POA: Diagnosis not present

## 2024-02-28 DIAGNOSIS — G629 Polyneuropathy, unspecified: Secondary | ICD-10-CM | POA: Diagnosis not present

## 2024-02-28 DIAGNOSIS — I495 Sick sinus syndrome: Secondary | ICD-10-CM | POA: Diagnosis not present

## 2024-02-28 DIAGNOSIS — Z950 Presence of cardiac pacemaker: Secondary | ICD-10-CM | POA: Diagnosis not present

## 2024-02-28 DIAGNOSIS — D696 Thrombocytopenia, unspecified: Secondary | ICD-10-CM | POA: Diagnosis not present

## 2024-02-28 DIAGNOSIS — I442 Atrioventricular block, complete: Secondary | ICD-10-CM | POA: Diagnosis not present

## 2024-02-28 DIAGNOSIS — M4726 Other spondylosis with radiculopathy, lumbar region: Secondary | ICD-10-CM | POA: Diagnosis not present

## 2024-02-28 DIAGNOSIS — J310 Chronic rhinitis: Secondary | ICD-10-CM | POA: Diagnosis not present

## 2024-02-28 DIAGNOSIS — I700 Atherosclerosis of aorta: Secondary | ICD-10-CM | POA: Diagnosis not present

## 2024-03-01 ENCOUNTER — Other Ambulatory Visit: Payer: Self-pay | Admitting: Cardiology

## 2024-03-01 DIAGNOSIS — G8929 Other chronic pain: Secondary | ICD-10-CM | POA: Diagnosis not present

## 2024-03-01 DIAGNOSIS — D696 Thrombocytopenia, unspecified: Secondary | ICD-10-CM | POA: Diagnosis not present

## 2024-03-01 DIAGNOSIS — I071 Rheumatic tricuspid insufficiency: Secondary | ICD-10-CM | POA: Diagnosis not present

## 2024-03-01 DIAGNOSIS — J310 Chronic rhinitis: Secondary | ICD-10-CM | POA: Diagnosis not present

## 2024-03-01 DIAGNOSIS — N39498 Other specified urinary incontinence: Secondary | ICD-10-CM | POA: Diagnosis not present

## 2024-03-01 DIAGNOSIS — D631 Anemia in chronic kidney disease: Secondary | ICD-10-CM | POA: Diagnosis not present

## 2024-03-01 DIAGNOSIS — Z87442 Personal history of urinary calculi: Secondary | ICD-10-CM | POA: Diagnosis not present

## 2024-03-01 DIAGNOSIS — Z556 Problems related to health literacy: Secondary | ICD-10-CM | POA: Diagnosis not present

## 2024-03-01 DIAGNOSIS — M5030 Other cervical disc degeneration, unspecified cervical region: Secondary | ICD-10-CM | POA: Diagnosis not present

## 2024-03-01 DIAGNOSIS — G629 Polyneuropathy, unspecified: Secondary | ICD-10-CM | POA: Diagnosis not present

## 2024-03-01 DIAGNOSIS — I700 Atherosclerosis of aorta: Secondary | ICD-10-CM | POA: Diagnosis not present

## 2024-03-01 DIAGNOSIS — F419 Anxiety disorder, unspecified: Secondary | ICD-10-CM | POA: Diagnosis not present

## 2024-03-01 DIAGNOSIS — K5730 Diverticulosis of large intestine without perforation or abscess without bleeding: Secondary | ICD-10-CM | POA: Diagnosis not present

## 2024-03-01 DIAGNOSIS — E039 Hypothyroidism, unspecified: Secondary | ICD-10-CM | POA: Diagnosis not present

## 2024-03-01 DIAGNOSIS — I442 Atrioventricular block, complete: Secondary | ICD-10-CM | POA: Diagnosis not present

## 2024-03-01 DIAGNOSIS — I129 Hypertensive chronic kidney disease with stage 1 through stage 4 chronic kidney disease, or unspecified chronic kidney disease: Secondary | ICD-10-CM | POA: Diagnosis not present

## 2024-03-01 DIAGNOSIS — Z950 Presence of cardiac pacemaker: Secondary | ICD-10-CM | POA: Diagnosis not present

## 2024-03-01 DIAGNOSIS — N183 Chronic kidney disease, stage 3 unspecified: Secondary | ICD-10-CM | POA: Diagnosis not present

## 2024-03-01 DIAGNOSIS — I495 Sick sinus syndrome: Secondary | ICD-10-CM | POA: Diagnosis not present

## 2024-03-01 DIAGNOSIS — M4726 Other spondylosis with radiculopathy, lumbar region: Secondary | ICD-10-CM | POA: Diagnosis not present

## 2024-03-01 DIAGNOSIS — N401 Enlarged prostate with lower urinary tract symptoms: Secondary | ICD-10-CM | POA: Diagnosis not present

## 2024-03-01 DIAGNOSIS — M5116 Intervertebral disc disorders with radiculopathy, lumbar region: Secondary | ICD-10-CM | POA: Diagnosis not present

## 2024-03-01 DIAGNOSIS — M47812 Spondylosis without myelopathy or radiculopathy, cervical region: Secondary | ICD-10-CM | POA: Diagnosis not present

## 2024-03-01 DIAGNOSIS — E0590 Thyrotoxicosis, unspecified without thyrotoxic crisis or storm: Secondary | ICD-10-CM | POA: Diagnosis not present

## 2024-03-01 DIAGNOSIS — H9190 Unspecified hearing loss, unspecified ear: Secondary | ICD-10-CM | POA: Diagnosis not present

## 2024-03-05 DIAGNOSIS — I700 Atherosclerosis of aorta: Secondary | ICD-10-CM | POA: Diagnosis not present

## 2024-03-05 DIAGNOSIS — Z556 Problems related to health literacy: Secondary | ICD-10-CM | POA: Diagnosis not present

## 2024-03-05 DIAGNOSIS — M5030 Other cervical disc degeneration, unspecified cervical region: Secondary | ICD-10-CM | POA: Diagnosis not present

## 2024-03-05 DIAGNOSIS — I129 Hypertensive chronic kidney disease with stage 1 through stage 4 chronic kidney disease, or unspecified chronic kidney disease: Secondary | ICD-10-CM | POA: Diagnosis not present

## 2024-03-05 DIAGNOSIS — E039 Hypothyroidism, unspecified: Secondary | ICD-10-CM | POA: Diagnosis not present

## 2024-03-05 DIAGNOSIS — G629 Polyneuropathy, unspecified: Secondary | ICD-10-CM | POA: Diagnosis not present

## 2024-03-05 DIAGNOSIS — H9190 Unspecified hearing loss, unspecified ear: Secondary | ICD-10-CM | POA: Diagnosis not present

## 2024-03-05 DIAGNOSIS — N183 Chronic kidney disease, stage 3 unspecified: Secondary | ICD-10-CM | POA: Diagnosis not present

## 2024-03-05 DIAGNOSIS — I442 Atrioventricular block, complete: Secondary | ICD-10-CM | POA: Diagnosis not present

## 2024-03-05 DIAGNOSIS — E0590 Thyrotoxicosis, unspecified without thyrotoxic crisis or storm: Secondary | ICD-10-CM | POA: Diagnosis not present

## 2024-03-05 DIAGNOSIS — D696 Thrombocytopenia, unspecified: Secondary | ICD-10-CM | POA: Diagnosis not present

## 2024-03-05 DIAGNOSIS — M5116 Intervertebral disc disorders with radiculopathy, lumbar region: Secondary | ICD-10-CM | POA: Diagnosis not present

## 2024-03-05 DIAGNOSIS — G8929 Other chronic pain: Secondary | ICD-10-CM | POA: Diagnosis not present

## 2024-03-05 DIAGNOSIS — D631 Anemia in chronic kidney disease: Secondary | ICD-10-CM | POA: Diagnosis not present

## 2024-03-05 DIAGNOSIS — M4726 Other spondylosis with radiculopathy, lumbar region: Secondary | ICD-10-CM | POA: Diagnosis not present

## 2024-03-05 DIAGNOSIS — N39498 Other specified urinary incontinence: Secondary | ICD-10-CM | POA: Diagnosis not present

## 2024-03-05 DIAGNOSIS — K5730 Diverticulosis of large intestine without perforation or abscess without bleeding: Secondary | ICD-10-CM | POA: Diagnosis not present

## 2024-03-05 DIAGNOSIS — M47812 Spondylosis without myelopathy or radiculopathy, cervical region: Secondary | ICD-10-CM | POA: Diagnosis not present

## 2024-03-05 DIAGNOSIS — Z950 Presence of cardiac pacemaker: Secondary | ICD-10-CM | POA: Diagnosis not present

## 2024-03-05 DIAGNOSIS — J310 Chronic rhinitis: Secondary | ICD-10-CM | POA: Diagnosis not present

## 2024-03-05 DIAGNOSIS — N401 Enlarged prostate with lower urinary tract symptoms: Secondary | ICD-10-CM | POA: Diagnosis not present

## 2024-03-05 DIAGNOSIS — Z87442 Personal history of urinary calculi: Secondary | ICD-10-CM | POA: Diagnosis not present

## 2024-03-05 DIAGNOSIS — I071 Rheumatic tricuspid insufficiency: Secondary | ICD-10-CM | POA: Diagnosis not present

## 2024-03-05 DIAGNOSIS — F419 Anxiety disorder, unspecified: Secondary | ICD-10-CM | POA: Diagnosis not present

## 2024-03-08 DIAGNOSIS — M5126 Other intervertebral disc displacement, lumbar region: Secondary | ICD-10-CM | POA: Diagnosis not present

## 2024-03-08 DIAGNOSIS — M5416 Radiculopathy, lumbar region: Secondary | ICD-10-CM | POA: Diagnosis not present

## 2024-03-08 DIAGNOSIS — M5451 Vertebrogenic low back pain: Secondary | ICD-10-CM | POA: Diagnosis not present

## 2024-03-08 NOTE — Progress Notes (Signed)
 Remote PPM Transmission

## 2024-03-19 DIAGNOSIS — G629 Polyneuropathy, unspecified: Secondary | ICD-10-CM | POA: Diagnosis not present

## 2024-03-19 DIAGNOSIS — I071 Rheumatic tricuspid insufficiency: Secondary | ICD-10-CM | POA: Diagnosis not present

## 2024-03-19 DIAGNOSIS — Z556 Problems related to health literacy: Secondary | ICD-10-CM | POA: Diagnosis not present

## 2024-03-19 DIAGNOSIS — H9190 Unspecified hearing loss, unspecified ear: Secondary | ICD-10-CM | POA: Diagnosis not present

## 2024-03-19 DIAGNOSIS — Z87442 Personal history of urinary calculi: Secondary | ICD-10-CM | POA: Diagnosis not present

## 2024-03-19 DIAGNOSIS — K5730 Diverticulosis of large intestine without perforation or abscess without bleeding: Secondary | ICD-10-CM | POA: Diagnosis not present

## 2024-03-19 DIAGNOSIS — E0590 Thyrotoxicosis, unspecified without thyrotoxic crisis or storm: Secondary | ICD-10-CM | POA: Diagnosis not present

## 2024-03-19 DIAGNOSIS — Z950 Presence of cardiac pacemaker: Secondary | ICD-10-CM | POA: Diagnosis not present

## 2024-03-19 DIAGNOSIS — F419 Anxiety disorder, unspecified: Secondary | ICD-10-CM | POA: Diagnosis not present

## 2024-03-19 DIAGNOSIS — D696 Thrombocytopenia, unspecified: Secondary | ICD-10-CM | POA: Diagnosis not present

## 2024-03-19 DIAGNOSIS — M47812 Spondylosis without myelopathy or radiculopathy, cervical region: Secondary | ICD-10-CM | POA: Diagnosis not present

## 2024-03-19 DIAGNOSIS — E039 Hypothyroidism, unspecified: Secondary | ICD-10-CM | POA: Diagnosis not present

## 2024-03-19 DIAGNOSIS — N401 Enlarged prostate with lower urinary tract symptoms: Secondary | ICD-10-CM | POA: Diagnosis not present

## 2024-03-19 DIAGNOSIS — I442 Atrioventricular block, complete: Secondary | ICD-10-CM | POA: Diagnosis not present

## 2024-03-19 DIAGNOSIS — G8929 Other chronic pain: Secondary | ICD-10-CM | POA: Diagnosis not present

## 2024-03-19 DIAGNOSIS — D631 Anemia in chronic kidney disease: Secondary | ICD-10-CM | POA: Diagnosis not present

## 2024-03-19 DIAGNOSIS — I495 Sick sinus syndrome: Secondary | ICD-10-CM | POA: Diagnosis not present

## 2024-03-19 DIAGNOSIS — N39498 Other specified urinary incontinence: Secondary | ICD-10-CM | POA: Diagnosis not present

## 2024-03-19 DIAGNOSIS — I700 Atherosclerosis of aorta: Secondary | ICD-10-CM | POA: Diagnosis not present

## 2024-03-19 DIAGNOSIS — I129 Hypertensive chronic kidney disease with stage 1 through stage 4 chronic kidney disease, or unspecified chronic kidney disease: Secondary | ICD-10-CM | POA: Diagnosis not present

## 2024-03-19 DIAGNOSIS — J310 Chronic rhinitis: Secondary | ICD-10-CM | POA: Diagnosis not present

## 2024-03-19 DIAGNOSIS — M4726 Other spondylosis with radiculopathy, lumbar region: Secondary | ICD-10-CM | POA: Diagnosis not present

## 2024-03-19 DIAGNOSIS — N183 Chronic kidney disease, stage 3 unspecified: Secondary | ICD-10-CM | POA: Diagnosis not present

## 2024-03-19 DIAGNOSIS — M5116 Intervertebral disc disorders with radiculopathy, lumbar region: Secondary | ICD-10-CM | POA: Diagnosis not present

## 2024-03-19 DIAGNOSIS — M5030 Other cervical disc degeneration, unspecified cervical region: Secondary | ICD-10-CM | POA: Diagnosis not present

## 2024-03-29 DIAGNOSIS — N39498 Other specified urinary incontinence: Secondary | ICD-10-CM | POA: Diagnosis not present

## 2024-03-29 DIAGNOSIS — E039 Hypothyroidism, unspecified: Secondary | ICD-10-CM | POA: Diagnosis not present

## 2024-03-29 DIAGNOSIS — Z556 Problems related to health literacy: Secondary | ICD-10-CM | POA: Diagnosis not present

## 2024-03-29 DIAGNOSIS — D631 Anemia in chronic kidney disease: Secondary | ICD-10-CM | POA: Diagnosis not present

## 2024-03-29 DIAGNOSIS — I495 Sick sinus syndrome: Secondary | ICD-10-CM | POA: Diagnosis not present

## 2024-03-29 DIAGNOSIS — I700 Atherosclerosis of aorta: Secondary | ICD-10-CM | POA: Diagnosis not present

## 2024-03-29 DIAGNOSIS — I129 Hypertensive chronic kidney disease with stage 1 through stage 4 chronic kidney disease, or unspecified chronic kidney disease: Secondary | ICD-10-CM | POA: Diagnosis not present

## 2024-03-29 DIAGNOSIS — Z87442 Personal history of urinary calculi: Secondary | ICD-10-CM | POA: Diagnosis not present

## 2024-03-29 DIAGNOSIS — M5030 Other cervical disc degeneration, unspecified cervical region: Secondary | ICD-10-CM | POA: Diagnosis not present

## 2024-03-29 DIAGNOSIS — M4726 Other spondylosis with radiculopathy, lumbar region: Secondary | ICD-10-CM | POA: Diagnosis not present

## 2024-03-29 DIAGNOSIS — I442 Atrioventricular block, complete: Secondary | ICD-10-CM | POA: Diagnosis not present

## 2024-03-29 DIAGNOSIS — N401 Enlarged prostate with lower urinary tract symptoms: Secondary | ICD-10-CM | POA: Diagnosis not present

## 2024-03-29 DIAGNOSIS — J310 Chronic rhinitis: Secondary | ICD-10-CM | POA: Diagnosis not present

## 2024-03-29 DIAGNOSIS — N183 Chronic kidney disease, stage 3 unspecified: Secondary | ICD-10-CM | POA: Diagnosis not present

## 2024-03-29 DIAGNOSIS — G8929 Other chronic pain: Secondary | ICD-10-CM | POA: Diagnosis not present

## 2024-03-29 DIAGNOSIS — H9190 Unspecified hearing loss, unspecified ear: Secondary | ICD-10-CM | POA: Diagnosis not present

## 2024-03-29 DIAGNOSIS — M47812 Spondylosis without myelopathy or radiculopathy, cervical region: Secondary | ICD-10-CM | POA: Diagnosis not present

## 2024-03-29 DIAGNOSIS — D696 Thrombocytopenia, unspecified: Secondary | ICD-10-CM | POA: Diagnosis not present

## 2024-03-29 DIAGNOSIS — G629 Polyneuropathy, unspecified: Secondary | ICD-10-CM | POA: Diagnosis not present

## 2024-03-29 DIAGNOSIS — I071 Rheumatic tricuspid insufficiency: Secondary | ICD-10-CM | POA: Diagnosis not present

## 2024-03-29 DIAGNOSIS — F419 Anxiety disorder, unspecified: Secondary | ICD-10-CM | POA: Diagnosis not present

## 2024-03-29 DIAGNOSIS — E0590 Thyrotoxicosis, unspecified without thyrotoxic crisis or storm: Secondary | ICD-10-CM | POA: Diagnosis not present

## 2024-03-29 DIAGNOSIS — K5730 Diverticulosis of large intestine without perforation or abscess without bleeding: Secondary | ICD-10-CM | POA: Diagnosis not present

## 2024-03-29 DIAGNOSIS — Z950 Presence of cardiac pacemaker: Secondary | ICD-10-CM | POA: Diagnosis not present

## 2024-03-29 DIAGNOSIS — M5116 Intervertebral disc disorders with radiculopathy, lumbar region: Secondary | ICD-10-CM | POA: Diagnosis not present

## 2024-04-02 ENCOUNTER — Ambulatory Visit (INDEPENDENT_AMBULATORY_CARE_PROVIDER_SITE_OTHER): Payer: Medicare Other

## 2024-04-02 DIAGNOSIS — I480 Paroxysmal atrial fibrillation: Secondary | ICD-10-CM | POA: Diagnosis not present

## 2024-04-04 LAB — CUP PACEART REMOTE DEVICE CHECK
Battery Impedance: 2414 Ohm
Battery Remaining Longevity: 31 mo
Battery Voltage: 2.74 V
Brady Statistic AP VP Percent: 14 %
Brady Statistic AP VS Percent: 55 %
Brady Statistic AS VP Percent: 1 %
Brady Statistic AS VS Percent: 30 %
Date Time Interrogation Session: 20251028144126
Implantable Lead Connection Status: 753985
Implantable Lead Connection Status: 753985
Implantable Lead Implant Date: 20020322
Implantable Lead Implant Date: 20030322
Implantable Lead Location: 753859
Implantable Lead Location: 753860
Implantable Lead Model: 5092
Implantable Lead Model: 5592
Implantable Pulse Generator Implant Date: 20110526
Lead Channel Impedance Value: 279 Ohm
Lead Channel Impedance Value: 419 Ohm
Lead Channel Pacing Threshold Amplitude: 0.75 V
Lead Channel Pacing Threshold Amplitude: 0.75 V
Lead Channel Pacing Threshold Pulse Width: 0.4 ms
Lead Channel Pacing Threshold Pulse Width: 0.4 ms
Lead Channel Setting Pacing Amplitude: 1.5 V
Lead Channel Setting Pacing Amplitude: 2 V
Lead Channel Setting Pacing Pulse Width: 0.4 ms
Lead Channel Setting Sensing Sensitivity: 2 mV
Zone Setting Status: 755011
Zone Setting Status: 755011

## 2024-04-08 ENCOUNTER — Ambulatory Visit: Payer: Self-pay | Admitting: Cardiology

## 2024-04-10 NOTE — Progress Notes (Signed)
 Remote PPM Transmission

## 2024-04-11 DIAGNOSIS — N39498 Other specified urinary incontinence: Secondary | ICD-10-CM | POA: Diagnosis not present

## 2024-04-11 DIAGNOSIS — E0590 Thyrotoxicosis, unspecified without thyrotoxic crisis or storm: Secondary | ICD-10-CM | POA: Diagnosis not present

## 2024-04-11 DIAGNOSIS — I071 Rheumatic tricuspid insufficiency: Secondary | ICD-10-CM | POA: Diagnosis not present

## 2024-04-11 DIAGNOSIS — J310 Chronic rhinitis: Secondary | ICD-10-CM | POA: Diagnosis not present

## 2024-04-11 DIAGNOSIS — G8929 Other chronic pain: Secondary | ICD-10-CM | POA: Diagnosis not present

## 2024-04-11 DIAGNOSIS — M5030 Other cervical disc degeneration, unspecified cervical region: Secondary | ICD-10-CM | POA: Diagnosis not present

## 2024-04-11 DIAGNOSIS — M47812 Spondylosis without myelopathy or radiculopathy, cervical region: Secondary | ICD-10-CM | POA: Diagnosis not present

## 2024-04-11 DIAGNOSIS — I129 Hypertensive chronic kidney disease with stage 1 through stage 4 chronic kidney disease, or unspecified chronic kidney disease: Secondary | ICD-10-CM | POA: Diagnosis not present

## 2024-04-11 DIAGNOSIS — M5116 Intervertebral disc disorders with radiculopathy, lumbar region: Secondary | ICD-10-CM | POA: Diagnosis not present

## 2024-04-11 DIAGNOSIS — F419 Anxiety disorder, unspecified: Secondary | ICD-10-CM | POA: Diagnosis not present

## 2024-04-11 DIAGNOSIS — G629 Polyneuropathy, unspecified: Secondary | ICD-10-CM | POA: Diagnosis not present

## 2024-04-11 DIAGNOSIS — I700 Atherosclerosis of aorta: Secondary | ICD-10-CM | POA: Diagnosis not present

## 2024-04-11 DIAGNOSIS — M5126 Other intervertebral disc displacement, lumbar region: Secondary | ICD-10-CM | POA: Diagnosis not present

## 2024-04-11 DIAGNOSIS — D631 Anemia in chronic kidney disease: Secondary | ICD-10-CM | POA: Diagnosis not present

## 2024-04-11 DIAGNOSIS — Z556 Problems related to health literacy: Secondary | ICD-10-CM | POA: Diagnosis not present

## 2024-04-11 DIAGNOSIS — M5416 Radiculopathy, lumbar region: Secondary | ICD-10-CM | POA: Diagnosis not present

## 2024-04-11 DIAGNOSIS — D696 Thrombocytopenia, unspecified: Secondary | ICD-10-CM | POA: Diagnosis not present

## 2024-04-11 DIAGNOSIS — N183 Chronic kidney disease, stage 3 unspecified: Secondary | ICD-10-CM | POA: Diagnosis not present

## 2024-04-11 DIAGNOSIS — I442 Atrioventricular block, complete: Secondary | ICD-10-CM | POA: Diagnosis not present

## 2024-04-11 DIAGNOSIS — E039 Hypothyroidism, unspecified: Secondary | ICD-10-CM | POA: Diagnosis not present

## 2024-04-11 DIAGNOSIS — M5451 Vertebrogenic low back pain: Secondary | ICD-10-CM | POA: Diagnosis not present

## 2024-04-11 DIAGNOSIS — Z950 Presence of cardiac pacemaker: Secondary | ICD-10-CM | POA: Diagnosis not present

## 2024-04-11 DIAGNOSIS — H9190 Unspecified hearing loss, unspecified ear: Secondary | ICD-10-CM | POA: Diagnosis not present

## 2024-04-11 DIAGNOSIS — M4726 Other spondylosis with radiculopathy, lumbar region: Secondary | ICD-10-CM | POA: Diagnosis not present

## 2024-04-11 DIAGNOSIS — I495 Sick sinus syndrome: Secondary | ICD-10-CM | POA: Diagnosis not present

## 2024-04-11 NOTE — Progress Notes (Addendum)
 [facility]opa[facility] Surgery Outpatient Visit   Michael Aguirre  DOB: 01/14/1941  MRN: [id number] 04/11/2024      Subjective:    Michael Aguirre is a 83 y.o. (DOB 1940-07-23) male.    HPI 83 year old male with chronic LBP who returns to clinic with intermittent low back pain that radiates to the LLE, left calf and foot/ankle numbness and weakness. Symptoms became worse last fall in the absence of trauma or injury and he now has to use a rollator and cane to ambulate. No red flag signs. He does have weakness with left ankle dorsiflexion, EHL, and plantarflexion. He has been treated with Left L5 SNRBs with good relief. He presents today to discuss medications and consider surgery.  PMH: CLL, CKD3. R elbow replacement. Hx cervical laminectomy >20 years ago with chronic loss of strength in UEs. He has chronic significant muscle wasting in L>R hands, including thenar and hypothenar eminence and intrinsics. He has pain with median nerve percussion and compression and + phalen B/L.   Has a pacemaker.  H/o cervical decompression d/t myelopathy and L L3-4 laminectomy done elsewhere. Chronic LBP. Spontaneous onset at end of Nov 2020 of LLE pain in S1 vs L5 pattern and he has responded well to treatment. PMH of CLL. Unable to tolerate Gabapentin.   Patient has trialed Tylenol  # 3 from his PCP along with Lyrica 100mg  2-3 times a day which he is tolerating well without felt SE. Unable to take gabapentin.  IMAGING Lumbar Spine XR taken today and reviewed with patient showing  loss of lumbar lordosis. Multilevel disc height loss with Multilevel facet arthropathy. No acute fractures or listhesis seen.  CT Lumbar Myelogram on canopy 05/2023 showing New large left subarticular disc extrusion at L5-S1 with severe left lateral recess stenosis and left L5 nerve root impingement. Unchanged mild spinal stenosis and  mild-to -moderate neural foraminal  stenosis at L4-5. Unchanged moderate bilateral neural foraminal stenosis at L2-3. Unchanged retroperitoneal and pelvic lymphadenopathy, consistent with history of CLL.  INJECTIONS L S1 SNRB 06/20/23 was aborted due to persistent vacular uptake.  L L5 SNRB with more than 50% improvement in back and leg pain and increase tolerance of walking following the injection. Persistence of left foot and lower leg numbness/paresthesias, occasionally into right heel as well.  L L5 SNRB 08/09/2023 with once again partial improvement, around 50%,  in his back pain and left leg radiculopathy symptoms for about 3 weeks after the injection. He particularly notices decrease heel and ankle burning in the immediate time frame after the injection. L L5 SNRB 11/14/23 with good relief but only temporary.   Reviewed and updated this visit by provider: Tobacco  Allergies  Meds  Problems  Med Hx  Surg Hx  Fam Hx       Review of Systems  Constitutional:  Negative for activity change, appetite change, chills, diaphoresis, fatigue, fever and unexpected weight change.  HENT: Negative.    Eyes: Negative.   Respiratory: Negative.  Negative for shortness of breath.   Cardiovascular:  Negative for chest pain, palpitations and leg swelling.  Gastrointestinal: Negative.   Endocrine: Negative.   Genitourinary: Negative.   Musculoskeletal:  Positive for back pain (posterior lumbar). Negative for arthralgias, gait problem, joint swelling, myalgias and neck pain.  Skin:  Negative for color change and wound.  Allergic/Immunologic: Negative.   Neurological:  Negative for weakness, numbness and headaches.  Hematological: Negative.   Psychiatric/Behavioral: Negative.  Negative for hallucinations, self-injury and sleep disturbance.       Objective:    Constitutional: He appears healthy. No distress.  Musculoskeletal:        General: Tenderness present. No edema.     Cervical back: Normal range of motion.     Lumbar back:  Positive right straight leg raise test and positive left straight leg raise test.  Neurological: He is alert and oriented to person, place, and time.  Skin: Skin is warm and dry.  Back Exam   Tenderness  The patient is experiencing tenderness in the lumbar.  Range of Motion  Extension:  normal  Flexion:  normal  Lateral bend right:  normal  Lateral bend left:  normal  Rotation right:  normal  Rotation left:  normal   Muscle Strength  The patient has normal back strength.  Tests  Straight leg raise right: positive Straight leg raise left: positive  Reflexes  Patellar:  Hyporeflexic normal Achilles:  Hyporeflexic normal Biceps:  normal Babinski's sign: normal   Other  Toe walk: normal Heel walk: normal Sensation: normal Gait: normal  Erythema: no back redness Scars: absent           Assessment / Plan:   ASSESSMENT 1. Lumbar radiculopathy (Primary) -     pregabalin (LYRICA) 100 mg capsule; Take one capsule (100 mg dose) by mouth 3 (three) times a day as needed. Max Daily Amount: 300 mg, Starting Wed 04/11/2024, Until Tue 07/10/2024 at 2359, Normal 2. Lumbar disc herniation 3. Vertebrogenic low back pain -     XR Spine  Lumbar 2-3 Views   PLAN Since lyrica is helping him the most, plan to continue. Refilled lyrica 100mg  TID. We discussed further treatment options.  His pain is tolerable with the Lyrica.  Therefore I do not think we should consider surgery at this time.  We discussed that he potentially would be a candidate for an L5-S1 laminectomy and fusion but considering his age and medical comorbidities we would prefer to avoid this.  We spent some time discussing the Lyrica and potential side effects.  I have advised him and his wife to be very careful about falling.  We discussed fall prevention measures.  He will take the absolute minimal effective dose of Lyrica to potentially minimize side effects.  Risks, benefits, and alternatives of the medications and  treatment plan prescribed today were discussed, and patient expressed understanding. Plan follow-up as discussed or as needed if any worsening symptoms or change in condition.   ____________________________________________________________ LILLETTE Elveria Hoit, PA , spent a total of 15 minutes involved with  Patient care activities including preparing to see the patient such as reviewing the patient record, counseling and educating the patient, family, and/or caregiver, ordering prescription medications, tests, or procedures, and referring and communicating with other health care providers when not separately reported during the visit.  Royden MICAEL Schneider, MD, 40 minutes spent today counseling patient, reviewing past medical records, completing physical examination, reviewing radiographic imaging, medication management, coordinating care and completing documentation.

## 2024-04-13 ENCOUNTER — Ambulatory Visit: Admitting: Oncology

## 2024-04-13 ENCOUNTER — Other Ambulatory Visit

## 2024-04-17 ENCOUNTER — Inpatient Hospital Stay

## 2024-04-17 ENCOUNTER — Inpatient Hospital Stay: Admitting: Oncology

## 2024-04-19 ENCOUNTER — Other Ambulatory Visit: Payer: Self-pay | Admitting: Oncology

## 2024-04-19 DIAGNOSIS — C9110 Chronic lymphocytic leukemia of B-cell type not having achieved remission: Secondary | ICD-10-CM

## 2024-04-19 NOTE — Progress Notes (Unsigned)
 [facility]  [address] [place],  [place]  [address] [phone]  Clinic Day:  12/12/2023  Referring physician: Fernand Tracey LABOR, MD   HISTORY OF PRESENT ILLNESS:  The patient is a 83 y.o. 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  Due to his disease leading to significant anemia, he was placed on acalabrutinib 100 mg twice daily in late July 2022, which led to disease improvement.  He also was recently found to be iron  deficient again to where he received IV iron  in October 2024.   Despite receiving IV iron , this gentleman's hemoglobin has remained low to where his acalabrutinib had been held since November 2024.  He comes in today to reassess his peripheral counts.  Overall, the patient claims to be doing well.  He denies having any B symptoms or bulky lymphadenopathy which concerns him for catabolic progression of his CLL since being off his acalabrutinib for these past months.  PHYSICAL EXAM:  There were no vitals taken for this visit. Wt Readings from Last 3 Encounters:  01/02/24 183 lb 9.6 oz (83.3 kg)  12/12/23 180 lb 1.6 oz (81.7 kg)  05/12/23 174 lb (78.9 kg)   There is no height or weight on file to calculate BMI. Performance status (ECOG): 1 - Symptomatic but completely ambulatory Physical Exam Constitutional:      Appearance: Normal appearance. He is normal weight. He is not ill-appearing.     Comments: A moderately frail older gentleman in a wheelchair  HENT:     Mouth/Throat:     Mouth: Mucous membranes are moist.     Pharynx: Oropharynx is clear. No oropharyngeal exudate or posterior oropharyngeal erythema.  Cardiovascular:     Rate and Rhythm: Normal rate and regular rhythm.     Heart sounds: No murmur heard.    No friction rub. No gallop.  Pulmonary:     Effort: Pulmonary effort is normal. No respiratory distress.     Breath sounds: Normal breath sounds. No  wheezing, rhonchi or rales.  Abdominal:     General: Bowel sounds are normal. There is no distension.     Palpations: Abdomen is soft. There is no mass.     Tenderness: There is no abdominal tenderness.  Musculoskeletal:        General: No swelling.     Right lower leg: No edema.     Left lower leg: No edema.  Lymphadenopathy:     Cervical: No cervical adenopathy.     Upper Body:     Right upper body: No supraclavicular or axillary adenopathy.     Left upper body: No supraclavicular or axillary adenopathy.     Lower Body: No right inguinal adenopathy. No left inguinal adenopathy.  Skin:    General: Skin is warm.     Coloration: Skin is not jaundiced.     Findings: No lesion or rash.  Neurological:     General: No focal deficit present.     Mental Status: He is alert and oriented to person, place, and time. Mental status is at baseline.  Psychiatric:        Mood and Affect: Mood normal.        Behavior: Behavior normal.        Thought Content: Thought content normal.    LABS:      Latest Ref Rng & Units 12/12/2023    2:18 PM 08/11/2023  2:01 PM 05/12/2023    1:50 PM  CBC  WBC 4.0 - 10.5 K/uL 5.9  6.1  5.6   Hemoglobin 13.0 - 17.0 g/dL 88.0  88.7  89.6   Hematocrit 39.0 - 52.0 % 36.6  35.0  31.7   Platelets 150 - 400 K/uL 145  146  139     Latest Reference Range & Units 12/12/23 14:18  Neutrophils % 40  Lymphocytes % 46  Monocytes Relative % 10  Eosinophil % 3  Basophil % 1  Immature Granulocytes % 0      Latest Ref Rng & Units 04/19/2023    2:57 PM 11/12/2022    2:37 PM 03/28/2020   12:00 AM  CMP  Glucose 70 - 99 mg/dL 847  892    BUN 8 - 23 mg/dL 21  26  29       Creatinine 0.61 - 1.24 mg/dL 8.34  8.64  1.3      Sodium 135 - 145 mmol/L 140  140  140      Potassium 3.5 - 5.1 mmol/L 4.4  4.1  4.1      Chloride 98 - 111 mmol/L 105  106  106      CO2 22 - 32 mmol/L 27  27  28       Calcium  8.9 - 10.3 mg/dL 9.1  8.9  8.0      Total Protein 6.5 - 8.1 g/dL  6.4     Total Bilirubin 0.3 - 1.2 mg/dL  0.4    Alkaline Phos 38 - 126 U/L  41  57      AST 15 - 41 U/L  14  31      ALT 0 - 44 U/L  14  15         This result is from an external source.   ASSESSMENT & PLAN:  Assessment/Plan:  An 83 y.o. male with chronic lymphocytic leukemia.  When evaluating his labs today, I am pleased with the continued improvement in his hemoglobin since his acalabrutinib has been held over the past 8 months.  Furthermore, his total white count is normal.  Based upon all of his hematologic parameters looking fine, I do not believe it is necessary for his acalabrutinib to be restarted at this time.  It will continue to be held until significant hematologic changes develop in the future to where this medication would need to be reinitiated.  Otherwise, I will see the patient back in another 4 months for repeat clinical assessment.  The patient understands all the plans discussed today and is in agreement with them.    Keion Neels DELENA Kerns, MD

## 2024-04-20 ENCOUNTER — Telehealth: Payer: Self-pay | Admitting: Oncology

## 2024-04-20 ENCOUNTER — Other Ambulatory Visit: Payer: Self-pay | Admitting: Oncology

## 2024-04-20 ENCOUNTER — Inpatient Hospital Stay: Attending: Oncology

## 2024-04-20 ENCOUNTER — Inpatient Hospital Stay: Admitting: Oncology

## 2024-04-20 VITALS — BP 163/92 | HR 69 | Temp 97.8°F | Resp 16 | Ht 67.0 in | Wt 190.1 lb

## 2024-04-20 DIAGNOSIS — C9110 Chronic lymphocytic leukemia of B-cell type not having achieved remission: Secondary | ICD-10-CM | POA: Diagnosis not present

## 2024-04-20 LAB — CBC WITH DIFFERENTIAL (CANCER CENTER ONLY)
Abs Immature Granulocytes: 0.01 K/uL (ref 0.00–0.07)
Basophils Absolute: 0 K/uL (ref 0.0–0.1)
Basophils Relative: 1 %
Eosinophils Absolute: 0.1 K/uL (ref 0.0–0.5)
Eosinophils Relative: 3 %
HCT: 36.5 % — ABNORMAL LOW (ref 39.0–52.0)
Hemoglobin: 12 g/dL — ABNORMAL LOW (ref 13.0–17.0)
Immature Granulocytes: 0 %
Lymphocytes Relative: 51 %
Lymphs Abs: 2.7 K/uL (ref 0.7–4.0)
MCH: 29.7 pg (ref 26.0–34.0)
MCHC: 32.9 g/dL (ref 30.0–36.0)
MCV: 90.3 fL (ref 80.0–100.0)
Monocytes Absolute: 0.4 K/uL (ref 0.1–1.0)
Monocytes Relative: 7 %
Neutro Abs: 2 K/uL (ref 1.7–7.7)
Neutrophils Relative %: 38 %
Platelet Count: 114 K/uL — ABNORMAL LOW (ref 150–400)
RBC: 4.04 MIL/uL — ABNORMAL LOW (ref 4.22–5.81)
RDW: 13.7 % (ref 11.5–15.5)
WBC Count: 5.3 K/uL (ref 4.0–10.5)
nRBC: 0 % (ref 0.0–0.2)

## 2024-04-20 LAB — CMP (CANCER CENTER ONLY)
ALT: 20 U/L (ref 0–44)
AST: 29 U/L (ref 15–41)
Albumin: 4 g/dL (ref 3.5–5.0)
Alkaline Phosphatase: 64 U/L (ref 38–126)
Anion gap: 9 (ref 5–15)
BUN: 22 mg/dL (ref 8–23)
CO2: 28 mmol/L (ref 22–32)
Calcium: 9.1 mg/dL (ref 8.9–10.3)
Chloride: 104 mmol/L (ref 98–111)
Creatinine: 1.3 mg/dL — ABNORMAL HIGH (ref 0.61–1.24)
GFR, Estimated: 54 mL/min — ABNORMAL LOW (ref >60)
Glucose, Bld: 168 mg/dL — ABNORMAL HIGH (ref 70–99)
Potassium: 4.1 mmol/L (ref 3.5–5.1)
Sodium: 141 mmol/L (ref 135–145)
Total Bilirubin: 0.5 mg/dL (ref 0.0–1.2)
Total Protein: 6.3 g/dL — ABNORMAL LOW (ref 6.5–8.1)

## 2024-04-20 NOTE — Telephone Encounter (Signed)
 Patient has been scheduled for follow-up visit per 04/20/24 LOS.  Pt given an appt calendar with date and time.

## 2024-04-21 LAB — IGG, IGA, IGM
IgA: 89 mg/dL (ref 61–437)
IgG (Immunoglobin G), Serum: 687 mg/dL (ref 603–1613)
IgM (Immunoglobulin M), Srm: 13 mg/dL — ABNORMAL LOW (ref 15–143)

## 2024-04-25 DIAGNOSIS — R351 Nocturia: Secondary | ICD-10-CM | POA: Diagnosis not present

## 2024-04-25 DIAGNOSIS — N401 Enlarged prostate with lower urinary tract symptoms: Secondary | ICD-10-CM | POA: Diagnosis not present

## 2024-06-12 ENCOUNTER — Telehealth: Payer: Self-pay | Admitting: Cardiology

## 2024-06-12 NOTE — Telephone Encounter (Signed)
 Spoke w/ patient regarding request to change PPM base rate from 70bpm to 60bpm. Patient states he has noted intermittent SOB and felt like his heart beats to death at times. States he feels fine right now. Specifically request to talk w/ Dr. Inocencio.   ED precautions given if symptoms persist or worsen. Verbalized understanding.   Appointment scheduled w/ Dr. Inocencio in [facility] on 06/25/2024 @ 4:15pm.   Device clinic number given for any further questions or concerns. Will continue to monitor and update accordingly.

## 2024-06-12 NOTE — Telephone Encounter (Signed)
 Pt is unhappy with his pacemaker being at a 70 and would like to discuss going back to 60. Please advise.

## 2024-06-25 ENCOUNTER — Ambulatory Visit: Attending: Cardiology | Admitting: Cardiology

## 2024-06-25 ENCOUNTER — Telehealth: Payer: Self-pay | Admitting: Cardiology

## 2024-06-25 ENCOUNTER — Encounter: Payer: Self-pay | Admitting: Cardiology

## 2024-06-25 VITALS — BP 120/70 | HR 70 | Ht 67.0 in | Wt 186.0 lb

## 2024-06-25 DIAGNOSIS — I10 Essential (primary) hypertension: Secondary | ICD-10-CM

## 2024-06-25 DIAGNOSIS — I442 Atrioventricular block, complete: Secondary | ICD-10-CM

## 2024-06-25 NOTE — Telephone Encounter (Signed)
 Patient is needing refill on his nitros, [employer] order ([facility]). CB # [phone]

## 2024-06-25 NOTE — Patient Instructions (Signed)
 Medication Instructions:  Your physician recommends that you continue on your current medications as directed. Please refer to the Current Medication list given to you today.  *If you need a refill on your cardiac medications before your next appointment, please call your pharmacy*   Follow-Up: At [facility], you and your health needs are our priority.  As part of our continuing mission to provide you with exceptional heart care, our providers are all part of one team.  This team includes your primary Cardiologist (physician) and Advanced Practice Providers or APPs (Physician Assistants and Nurse Practitioners) who all work together to provide you with the care you need, when you need it.  Your next appointment:   1 year(s)  Provider:   You will see one of the following Advanced Practice Providers on your designated Care Team:   Charlies Arthur, [place] Ozell Jodie Passey, PA-C Suzann Riddle, NP Daphne Barrack, NP Artist Pouch, PA-C

## 2024-06-25 NOTE — Progress Notes (Signed)
" °  Electrophysiology Office Note:   Date:  06/25/2024  ID:  Michael Aguirre, DOB 1941/02/24, MRN [id number]  Primary Cardiologist: Lamar Fitch, MD Primary Heart Failure: None Electrophysiologist: Michael Othman Gladis Norton, MD      History of Present Illness:   Michael Aguirre is a 84 y.o. male with h/o complete heart block, CLL, hypertension seen today for routine electrophysiology followup.   Discussed the use of AI scribe software for clinical note transcription with the patient, who gave verbal consent to proceed.  History of Present Illness Michael Aguirre is an 84 year old male who presents with shortness of breath and episodes of syncope.  In September, he experienced a sudden episode of unresponsiveness while sitting at a bar, described as 'like nobody was home.' Emergency services noted a faint pulse and initiated compressions, after which he took a deep breath and became more aware.  Since the incident, he has been experiencing episodes of feeling like his 'system is not keeping up with his heart,' particularly upon waking, which affects his sleep for two to three nights before he gets a good night's rest.  He experiences shortness of breath and weakness, especially when walking from one room to another. He underwent physical therapy at home for six to eight weeks, which included exercises to help with his condition.  He has a cardiac device, and recent adjustments were made to it, lowering the setting from seventy to fifty.  No significant heart-related symptoms prior to the September incident, although he mentions having other unspecified health issues. 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  he denies chest pain, palpitations, dyspnea, PND, orthopnea, nausea, vomiting, dizziness, syncope, edema, weight gain, or early satiety.   Review of systems complete and found to be negative  unless listed in HPI.      EP Information / Studies Reviewed:    EKG is not ordered today. EKG from 01/01/25 reviewed which showed sinus rhythm      PPM Interrogation-  reviewed in detail today,  See PACEART report.  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  Physical Exam:   VS:  BP 120/70 (BP Location: Left Arm, Patient Position: Sitting, Cuff Size: Normal)   Pulse 70   Ht 5' 7 (1.702 m)   Wt 186 lb (84.4 kg)   SpO2 99%   BMI 29.13 kg/m    Wt Readings from Last 3 Encounters:  06/25/24 186 lb (84.4 kg)  04/20/24 190 lb 1.6 oz (86.2 kg)  01/02/24 183 lb 9.6 oz (83.3 kg)     GEN: Well nourished, well developed in no acute distress NECK: No JVD; No carotid bruits CARDIAC: Regular rate and rhythm, no murmurs, rubs, gallops RESPIRATORY:  Clear to auscultation without rales, wheezing or rhonchi  ABDOMEN: Soft, non-tender, non-distended EXTREMITIES:  No edema; No deformity   ASSESSMENT AND PLAN:    CHB s/p Abbott PPM  Normal PPM function See Pace Art report Lower rate has been changed to 50 bpm  2.  Hypertension: well controlled  Disposition:   Follow up with EP Team in 12 months  Signed, Deneice Wack Gladis Norton, MD  "

## 2024-06-26 ENCOUNTER — Ambulatory Visit: Payer: Self-pay | Admitting: Cardiology

## 2024-06-26 LAB — CUP PACEART INCLINIC DEVICE CHECK
Battery Impedance: 2425 Ohm
Battery Remaining Longevity: 28 mo
Battery Voltage: 2.74 V
Brady Statistic AP VP Percent: 17 %
Brady Statistic AP VS Percent: 54 %
Brady Statistic AS VP Percent: 1 %
Brady Statistic AS VS Percent: 28 %
Date Time Interrogation Session: 20260119180300
Implantable Lead Connection Status: 753985
Implantable Lead Connection Status: 753985
Implantable Lead Implant Date: 20020322
Implantable Lead Implant Date: 20030322
Implantable Lead Location: 753859
Implantable Lead Location: 753860
Implantable Lead Model: 5092
Implantable Lead Model: 5592
Implantable Pulse Generator Implant Date: 20110526
Lead Channel Impedance Value: 134 Ohm
Lead Channel Impedance Value: 446 Ohm
Lead Channel Pacing Threshold Amplitude: 0.75 V
Lead Channel Pacing Threshold Amplitude: 0.75 V
Lead Channel Pacing Threshold Pulse Width: 0.4 ms
Lead Channel Pacing Threshold Pulse Width: 0.4 ms
Lead Channel Sensing Intrinsic Amplitude: 1 mV
Lead Channel Sensing Intrinsic Amplitude: 4 mV
Lead Channel Setting Pacing Amplitude: 1.5 V
Lead Channel Setting Pacing Amplitude: 2 V
Lead Channel Setting Pacing Pulse Width: 0.4 ms
Lead Channel Setting Sensing Sensitivity: 2 mV
Zone Setting Status: 755011
Zone Setting Status: 755011

## 2024-06-27 ENCOUNTER — Other Ambulatory Visit: Payer: Self-pay

## 2024-06-27 MED ORDER — NITROGLYCERIN 0.4 MG SL SUBL: 0.4000 mg | SUBLINGUAL_TABLET | SUBLINGUAL | 3 refills | Status: AC | PRN

## 2024-07-04 ENCOUNTER — Telehealth: Payer: Self-pay | Admitting: Cardiology

## 2024-07-04 NOTE — Telephone Encounter (Signed)
 Called re missed remote

## 2024-08-20 ENCOUNTER — Inpatient Hospital Stay: Admitting: Oncology

## 2024-08-20 ENCOUNTER — Inpatient Hospital Stay
